# Patient Record
Sex: Female | Born: 1955 | Race: White | Hispanic: No | Marital: Married | Smoking: Former smoker
Health system: Southern US, Community
[De-identification: ages and names within clinical notes are randomized; demographics above are authoritative.]

## PROBLEM LIST (undated history)

## (undated) DIAGNOSIS — M722 Plantar fascial fibromatosis: Secondary | ICD-10-CM

## (undated) DIAGNOSIS — E7800 Pure hypercholesterolemia, unspecified: Secondary | ICD-10-CM

## (undated) DIAGNOSIS — N649 Disorder of breast, unspecified: Secondary | ICD-10-CM

## (undated) DIAGNOSIS — S83209A Unspecified tear of unspecified meniscus, current injury, unspecified knee, initial encounter: Secondary | ICD-10-CM

## (undated) DIAGNOSIS — N3020 Other chronic cystitis without hematuria: Secondary | ICD-10-CM

## (undated) DIAGNOSIS — F419 Anxiety disorder, unspecified: Secondary | ICD-10-CM

## (undated) DIAGNOSIS — F32A Depression, unspecified: Secondary | ICD-10-CM

## (undated) DIAGNOSIS — E039 Hypothyroidism, unspecified: Secondary | ICD-10-CM

## (undated) DIAGNOSIS — M1990 Unspecified osteoarthritis, unspecified site: Secondary | ICD-10-CM

## (undated) DIAGNOSIS — F329 Major depressive disorder, single episode, unspecified: Secondary | ICD-10-CM

## (undated) DIAGNOSIS — IMO0002 Reserved for concepts with insufficient information to code with codable children: Secondary | ICD-10-CM

## (undated) HISTORY — DX: Unspecified osteoarthritis, unspecified site: M19.90

## (undated) HISTORY — PX: OTHER SURGICAL HISTORY: SHX169

## (undated) HISTORY — PX: BLEPHAROPLASTY: SUR158

## (undated) HISTORY — DX: Depression, unspecified: F32.A

## (undated) HISTORY — DX: Other chronic cystitis without hematuria: N30.20

## (undated) HISTORY — PX: BREAST ENHANCEMENT SURGERY: SHX7

## (undated) HISTORY — DX: Reserved for concepts with insufficient information to code with codable children: IMO0002

## (undated) HISTORY — DX: Pure hypercholesterolemia, unspecified: E78.00

## (undated) HISTORY — DX: Disorder of breast, unspecified: N64.9

## (undated) HISTORY — DX: Plantar fascial fibromatosis: M72.2

## (undated) HISTORY — DX: Hypothyroidism, unspecified: E03.9

## (undated) HISTORY — DX: Major depressive disorder, single episode, unspecified: F32.9

## (undated) HISTORY — DX: Unspecified tear of unspecified meniscus, current injury, unspecified knee, initial encounter: S83.209A

## (undated) HISTORY — PX: AUGMENTATION MAMMAPLASTY: SUR837

## (undated) HISTORY — DX: Anxiety disorder, unspecified: F41.9

---

## 2006-01-18 ENCOUNTER — Encounter: Payer: Self-pay | Admitting: Family Medicine

## 2006-02-09 ENCOUNTER — Encounter: Payer: Self-pay | Admitting: Family Medicine

## 2006-02-09 LAB — CONVERTED CEMR LAB: TSH: 0.032 microintl units/mL

## 2006-02-10 ENCOUNTER — Encounter: Payer: Self-pay | Admitting: Family Medicine

## 2006-02-10 LAB — CONVERTED CEMR LAB: HDL: 64 mg/dL

## 2006-08-12 ENCOUNTER — Encounter: Payer: Self-pay | Admitting: Family Medicine

## 2006-09-03 ENCOUNTER — Encounter: Payer: Self-pay | Admitting: Family Medicine

## 2006-09-03 LAB — CONVERTED CEMR LAB
Anion Gap: 5
BUN: 14 mg/dL
CO2: 33 meq/L
Calcium: 10.5 mg/dL
Creatinine, Ser: 0.8 mg/dL
Sodium: 138 meq/L

## 2007-03-31 ENCOUNTER — Encounter: Payer: Self-pay | Admitting: Family Medicine

## 2007-06-14 ENCOUNTER — Ambulatory Visit: Payer: Self-pay | Admitting: Family Medicine

## 2007-07-18 ENCOUNTER — Ambulatory Visit: Payer: Self-pay | Admitting: Family Medicine

## 2007-07-18 ENCOUNTER — Encounter: Admission: RE | Admit: 2007-07-18 | Discharge: 2007-07-18 | Payer: Self-pay | Admitting: Family Medicine

## 2007-07-18 DIAGNOSIS — M461 Sacroiliitis, not elsewhere classified: Secondary | ICD-10-CM | POA: Insufficient documentation

## 2007-07-18 DIAGNOSIS — N390 Urinary tract infection, site not specified: Secondary | ICD-10-CM

## 2007-07-21 ENCOUNTER — Encounter: Payer: Self-pay | Admitting: Family Medicine

## 2007-10-05 ENCOUNTER — Ambulatory Visit: Payer: Self-pay | Admitting: Family Medicine

## 2007-10-05 DIAGNOSIS — E039 Hypothyroidism, unspecified: Secondary | ICD-10-CM | POA: Insufficient documentation

## 2007-10-05 LAB — CONVERTED CEMR LAB
Bilirubin Urine: NEGATIVE
Blood in Urine, dipstick: NEGATIVE
Glucose, Urine, Semiquant: NEGATIVE
Ketones, urine, test strip: NEGATIVE
Protein, U semiquant: NEGATIVE

## 2007-10-06 ENCOUNTER — Encounter: Payer: Self-pay | Admitting: Family Medicine

## 2007-10-07 ENCOUNTER — Telehealth: Payer: Self-pay | Admitting: Family Medicine

## 2007-10-12 ENCOUNTER — Telehealth (INDEPENDENT_AMBULATORY_CARE_PROVIDER_SITE_OTHER): Payer: Self-pay | Admitting: *Deleted

## 2007-10-21 ENCOUNTER — Encounter: Payer: Self-pay | Admitting: Family Medicine

## 2007-10-21 LAB — CONVERTED CEMR LAB
ALT: 10 units/L (ref 0–35)
AST: 18 units/L (ref 0–37)
CO2: 26 meq/L (ref 19–32)
Calcium: 10.2 mg/dL (ref 8.4–10.5)
Chloride: 103 meq/L (ref 96–112)
Cholesterol: 271 mg/dL — ABNORMAL HIGH (ref 0–200)
Creatinine, Ser: 0.92 mg/dL (ref 0.40–1.20)
Potassium: 4.5 meq/L (ref 3.5–5.3)
Sodium: 142 meq/L (ref 135–145)
TSH: 0.067 microintl units/mL — ABNORMAL LOW (ref 0.350–5.50)
Total CHOL/HDL Ratio: 4.4
Total Protein: 7.5 g/dL (ref 6.0–8.3)

## 2007-10-24 ENCOUNTER — Encounter: Payer: Self-pay | Admitting: Family Medicine

## 2007-11-11 ENCOUNTER — Ambulatory Visit: Payer: Self-pay | Admitting: Family Medicine

## 2007-11-11 DIAGNOSIS — K120 Recurrent oral aphthae: Secondary | ICD-10-CM | POA: Insufficient documentation

## 2007-12-12 ENCOUNTER — Telehealth (INDEPENDENT_AMBULATORY_CARE_PROVIDER_SITE_OTHER): Payer: Self-pay | Admitting: *Deleted

## 2007-12-13 ENCOUNTER — Ambulatory Visit: Payer: Self-pay | Admitting: Family Medicine

## 2008-01-04 ENCOUNTER — Encounter: Admission: RE | Admit: 2008-01-04 | Discharge: 2008-01-04 | Payer: Self-pay | Admitting: Family Medicine

## 2008-01-06 ENCOUNTER — Ambulatory Visit: Payer: Self-pay | Admitting: Obstetrics & Gynecology

## 2008-01-06 ENCOUNTER — Encounter: Payer: Self-pay | Admitting: Physician Assistant

## 2008-01-17 ENCOUNTER — Telehealth (INDEPENDENT_AMBULATORY_CARE_PROVIDER_SITE_OTHER): Payer: Self-pay | Admitting: *Deleted

## 2008-01-18 ENCOUNTER — Encounter: Admission: RE | Admit: 2008-01-18 | Discharge: 2008-01-18 | Payer: Self-pay | Admitting: Family Medicine

## 2008-02-21 ENCOUNTER — Ambulatory Visit: Payer: Self-pay | Admitting: Family Medicine

## 2008-02-21 DIAGNOSIS — M1610 Unilateral primary osteoarthritis, unspecified hip: Secondary | ICD-10-CM | POA: Insufficient documentation

## 2008-04-10 ENCOUNTER — Ambulatory Visit: Payer: Self-pay | Admitting: Obstetrics and Gynecology

## 2008-05-17 ENCOUNTER — Telehealth (INDEPENDENT_AMBULATORY_CARE_PROVIDER_SITE_OTHER): Payer: Self-pay | Admitting: *Deleted

## 2008-05-28 ENCOUNTER — Ambulatory Visit: Payer: Self-pay | Admitting: Family Medicine

## 2008-05-28 DIAGNOSIS — E785 Hyperlipidemia, unspecified: Secondary | ICD-10-CM

## 2008-06-14 ENCOUNTER — Encounter: Payer: Self-pay | Admitting: Family Medicine

## 2008-06-18 ENCOUNTER — Encounter: Payer: Self-pay | Admitting: Family Medicine

## 2008-06-18 LAB — CONVERTED CEMR LAB
Cholesterol: 312 mg/dL — ABNORMAL HIGH (ref 0–200)
Total CHOL/HDL Ratio: 5.4
Triglycerides: 191 mg/dL — ABNORMAL HIGH (ref <150)
VLDL: 38 mg/dL (ref 0–40)

## 2008-07-13 ENCOUNTER — Telehealth: Payer: Self-pay | Admitting: Family Medicine

## 2008-07-18 ENCOUNTER — Encounter: Admission: RE | Admit: 2008-07-18 | Discharge: 2008-07-18 | Payer: Self-pay | Admitting: Family Medicine

## 2008-09-11 ENCOUNTER — Telehealth: Payer: Self-pay | Admitting: Family Medicine

## 2008-09-11 DIAGNOSIS — N951 Menopausal and female climacteric states: Secondary | ICD-10-CM | POA: Insufficient documentation

## 2008-09-21 ENCOUNTER — Encounter: Admission: RE | Admit: 2008-09-21 | Discharge: 2008-09-21 | Payer: Self-pay | Admitting: Family Medicine

## 2008-09-25 ENCOUNTER — Encounter: Payer: Self-pay | Admitting: Family Medicine

## 2008-09-25 DIAGNOSIS — M8580 Other specified disorders of bone density and structure, unspecified site: Secondary | ICD-10-CM

## 2008-09-26 ENCOUNTER — Ambulatory Visit: Payer: Self-pay | Admitting: Family Medicine

## 2008-10-16 ENCOUNTER — Encounter: Payer: Self-pay | Admitting: Family Medicine

## 2008-11-06 ENCOUNTER — Ambulatory Visit: Payer: Self-pay | Admitting: Family Medicine

## 2008-11-06 DIAGNOSIS — M4120 Other idiopathic scoliosis, site unspecified: Secondary | ICD-10-CM | POA: Insufficient documentation

## 2008-11-08 ENCOUNTER — Telehealth (INDEPENDENT_AMBULATORY_CARE_PROVIDER_SITE_OTHER): Payer: Self-pay | Admitting: *Deleted

## 2008-11-23 ENCOUNTER — Encounter: Payer: Self-pay | Admitting: Family Medicine

## 2008-11-27 ENCOUNTER — Telehealth (INDEPENDENT_AMBULATORY_CARE_PROVIDER_SITE_OTHER): Payer: Self-pay | Admitting: *Deleted

## 2008-11-30 ENCOUNTER — Encounter: Payer: Self-pay | Admitting: Family Medicine

## 2008-12-03 ENCOUNTER — Ambulatory Visit: Payer: Self-pay | Admitting: Family Medicine

## 2008-12-03 DIAGNOSIS — F332 Major depressive disorder, recurrent severe without psychotic features: Secondary | ICD-10-CM | POA: Insufficient documentation

## 2008-12-04 LAB — CONVERTED CEMR LAB: Vit D, 1,25-Dihydroxy: 41 (ref 30–89)

## 2008-12-05 ENCOUNTER — Ambulatory Visit: Payer: Self-pay | Admitting: Cardiology

## 2008-12-11 ENCOUNTER — Encounter: Payer: Self-pay | Admitting: Family Medicine

## 2008-12-28 ENCOUNTER — Ambulatory Visit: Payer: Self-pay

## 2008-12-28 ENCOUNTER — Encounter: Payer: Self-pay | Admitting: Cardiology

## 2009-01-04 ENCOUNTER — Telehealth: Payer: Self-pay | Admitting: Family Medicine

## 2009-01-07 ENCOUNTER — Encounter: Payer: Self-pay | Admitting: Family

## 2009-01-07 ENCOUNTER — Ambulatory Visit: Payer: Self-pay | Admitting: Family

## 2009-01-14 ENCOUNTER — Ambulatory Visit: Payer: Self-pay | Admitting: Family Medicine

## 2009-01-15 ENCOUNTER — Encounter: Payer: Self-pay | Admitting: Family Medicine

## 2009-01-15 LAB — CONVERTED CEMR LAB: TSH: 1.326 microintl units/mL (ref 0.350–4.50)

## 2009-01-18 ENCOUNTER — Encounter: Payer: Self-pay | Admitting: Family Medicine

## 2009-01-22 ENCOUNTER — Telehealth: Payer: Self-pay | Admitting: Family Medicine

## 2009-01-25 ENCOUNTER — Telehealth: Payer: Self-pay | Admitting: Family Medicine

## 2009-01-29 ENCOUNTER — Encounter: Admission: RE | Admit: 2009-01-29 | Discharge: 2009-01-29 | Payer: Self-pay | Admitting: Family Medicine

## 2009-02-15 ENCOUNTER — Ambulatory Visit: Payer: Self-pay | Admitting: Family Medicine

## 2009-02-20 ENCOUNTER — Telehealth: Payer: Self-pay | Admitting: Family Medicine

## 2009-03-11 ENCOUNTER — Telehealth: Payer: Self-pay | Admitting: Family Medicine

## 2009-04-05 ENCOUNTER — Encounter: Payer: Self-pay | Admitting: Family Medicine

## 2009-04-10 ENCOUNTER — Telehealth: Payer: Self-pay | Admitting: Family Medicine

## 2009-06-26 ENCOUNTER — Ambulatory Visit: Payer: Self-pay | Admitting: Family Medicine

## 2009-06-26 DIAGNOSIS — R6882 Decreased libido: Secondary | ICD-10-CM

## 2009-06-27 ENCOUNTER — Telehealth (INDEPENDENT_AMBULATORY_CARE_PROVIDER_SITE_OTHER): Payer: Self-pay | Admitting: *Deleted

## 2009-06-27 LAB — CONVERTED CEMR LAB
ALT: 15 units/L (ref 0–35)
AST: 25 units/L (ref 0–37)
Albumin: 4.5 g/dL (ref 3.5–5.2)
BUN: 17 mg/dL (ref 6–23)
CO2: 25 meq/L (ref 19–32)
Calcium: 9.8 mg/dL (ref 8.4–10.5)
Chloride: 103 meq/L (ref 96–112)
Potassium: 4.5 meq/L (ref 3.5–5.3)
TSH: 1.215 microintl units/mL (ref 0.350–4.500)

## 2009-07-11 ENCOUNTER — Telehealth: Payer: Self-pay | Admitting: Family Medicine

## 2009-08-16 ENCOUNTER — Ambulatory Visit: Payer: Self-pay | Admitting: Family Medicine

## 2009-08-16 DIAGNOSIS — R21 Rash and other nonspecific skin eruption: Secondary | ICD-10-CM | POA: Insufficient documentation

## 2009-08-17 ENCOUNTER — Encounter: Payer: Self-pay | Admitting: Family Medicine

## 2009-10-21 ENCOUNTER — Telehealth (INDEPENDENT_AMBULATORY_CARE_PROVIDER_SITE_OTHER): Payer: Self-pay | Admitting: *Deleted

## 2009-12-19 ENCOUNTER — Ambulatory Visit: Payer: Self-pay | Admitting: Family Medicine

## 2009-12-27 ENCOUNTER — Encounter: Payer: Self-pay | Admitting: Family Medicine

## 2009-12-30 LAB — CONVERTED CEMR LAB
ALT: 12 units/L (ref 0–35)
AST: 18 units/L (ref 0–37)
Alkaline Phosphatase: 46 units/L (ref 39–117)
BUN: 17 mg/dL (ref 6–23)
Creatinine, Ser: 0.71 mg/dL (ref 0.40–1.20)
HDL: 60 mg/dL (ref >39)
LDL Cholesterol: 123 mg/dL — ABNORMAL HIGH (ref 0–99)
TSH: 0.723 microintl units/mL (ref 0.350–4.500)
Total CHOL/HDL Ratio: 3.4
VLDL: 20 mg/dL (ref 0–40)

## 2010-01-14 ENCOUNTER — Ambulatory Visit: Payer: Self-pay | Admitting: Obstetrics & Gynecology

## 2010-02-04 ENCOUNTER — Encounter: Admission: RE | Admit: 2010-02-04 | Discharge: 2010-02-04 | Payer: Self-pay | Admitting: Family Medicine

## 2010-02-17 ENCOUNTER — Encounter: Admission: RE | Admit: 2010-02-17 | Discharge: 2010-02-17 | Payer: Self-pay | Admitting: Family Medicine

## 2010-02-17 ENCOUNTER — Ambulatory Visit: Payer: Self-pay | Admitting: Family Medicine

## 2010-02-17 DIAGNOSIS — R109 Unspecified abdominal pain: Secondary | ICD-10-CM

## 2010-02-19 ENCOUNTER — Telehealth (INDEPENDENT_AMBULATORY_CARE_PROVIDER_SITE_OTHER): Payer: Self-pay | Admitting: *Deleted

## 2010-02-24 ENCOUNTER — Telehealth (INDEPENDENT_AMBULATORY_CARE_PROVIDER_SITE_OTHER): Payer: Self-pay | Admitting: *Deleted

## 2010-02-24 ENCOUNTER — Telehealth: Payer: Self-pay | Admitting: Family Medicine

## 2010-02-25 ENCOUNTER — Encounter: Payer: Self-pay | Admitting: Family Medicine

## 2010-03-24 ENCOUNTER — Encounter: Payer: Self-pay | Admitting: Family Medicine

## 2010-04-09 ENCOUNTER — Encounter: Payer: Self-pay | Admitting: Family Medicine

## 2010-04-14 ENCOUNTER — Encounter: Payer: Self-pay | Admitting: Family Medicine

## 2010-04-15 ENCOUNTER — Ambulatory Visit: Payer: Self-pay | Admitting: Family Medicine

## 2010-04-15 DIAGNOSIS — S329XXA Fracture of unspecified parts of lumbosacral spine and pelvis, initial encounter for closed fracture: Secondary | ICD-10-CM | POA: Insufficient documentation

## 2010-04-15 DIAGNOSIS — H1130 Conjunctival hemorrhage, unspecified eye: Secondary | ICD-10-CM | POA: Insufficient documentation

## 2010-04-15 LAB — CONVERTED CEMR LAB
Glucose, Urine, Semiquant: NEGATIVE
Nitrite: POSITIVE
Specific Gravity, Urine: 1.025

## 2010-04-17 ENCOUNTER — Encounter: Payer: Self-pay | Admitting: Family Medicine

## 2010-04-30 ENCOUNTER — Ambulatory Visit: Payer: Self-pay | Admitting: Family Medicine

## 2010-04-30 LAB — CONVERTED CEMR LAB
Ketones, urine, test strip: NEGATIVE
Nitrite: NEGATIVE
Urobilinogen, UA: 0.2

## 2010-05-01 ENCOUNTER — Encounter: Payer: Self-pay | Admitting: Family Medicine

## 2010-05-05 ENCOUNTER — Encounter: Payer: Self-pay | Admitting: Family Medicine

## 2010-05-13 ENCOUNTER — Ambulatory Visit: Payer: Self-pay | Admitting: Obstetrics & Gynecology

## 2010-06-25 ENCOUNTER — Ambulatory Visit: Payer: Self-pay | Admitting: Obstetrics & Gynecology

## 2010-06-30 ENCOUNTER — Telehealth: Payer: Self-pay | Admitting: Cardiology

## 2010-07-04 ENCOUNTER — Telehealth: Payer: Self-pay | Admitting: Family Medicine

## 2010-07-16 ENCOUNTER — Ambulatory Visit: Payer: Self-pay | Admitting: Family Medicine

## 2010-07-16 DIAGNOSIS — M545 Low back pain: Secondary | ICD-10-CM

## 2010-07-29 ENCOUNTER — Ambulatory Visit: Payer: Self-pay | Admitting: Family Medicine

## 2010-07-29 DIAGNOSIS — J209 Acute bronchitis, unspecified: Secondary | ICD-10-CM

## 2010-08-04 ENCOUNTER — Telehealth: Payer: Self-pay | Admitting: Family Medicine

## 2010-08-08 ENCOUNTER — Telehealth (INDEPENDENT_AMBULATORY_CARE_PROVIDER_SITE_OTHER): Payer: Self-pay | Admitting: *Deleted

## 2010-08-12 ENCOUNTER — Ambulatory Visit: Payer: Self-pay | Admitting: Family Medicine

## 2010-08-12 DIAGNOSIS — F410 Panic disorder [episodic paroxysmal anxiety] without agoraphobia: Secondary | ICD-10-CM

## 2010-08-19 ENCOUNTER — Telehealth (INDEPENDENT_AMBULATORY_CARE_PROVIDER_SITE_OTHER): Payer: Self-pay | Admitting: *Deleted

## 2010-08-21 ENCOUNTER — Telehealth: Payer: Self-pay | Admitting: Family Medicine

## 2010-09-09 ENCOUNTER — Telehealth (INDEPENDENT_AMBULATORY_CARE_PROVIDER_SITE_OTHER): Payer: Self-pay | Admitting: *Deleted

## 2010-09-09 ENCOUNTER — Encounter: Payer: Self-pay | Admitting: Family Medicine

## 2010-09-09 ENCOUNTER — Telehealth: Payer: Self-pay | Admitting: Family Medicine

## 2010-09-16 ENCOUNTER — Ambulatory Visit: Payer: Self-pay | Admitting: Obstetrics & Gynecology

## 2010-09-16 ENCOUNTER — Ambulatory Visit: Payer: Self-pay | Admitting: Family Medicine

## 2010-09-29 ENCOUNTER — Telehealth: Payer: Self-pay | Admitting: Family Medicine

## 2010-09-30 ENCOUNTER — Telehealth: Payer: Self-pay | Admitting: Family Medicine

## 2010-10-09 ENCOUNTER — Encounter: Payer: Self-pay | Admitting: Family Medicine

## 2010-11-05 ENCOUNTER — Ambulatory Visit: Payer: Self-pay | Admitting: Obstetrics & Gynecology

## 2010-11-06 ENCOUNTER — Encounter: Payer: Self-pay | Admitting: Obstetrics & Gynecology

## 2010-11-06 LAB — CONVERTED CEMR LAB: Yeast Wet Prep HPF POC: NONE SEEN

## 2010-11-11 ENCOUNTER — Encounter: Admission: RE | Admit: 2010-11-11 | Discharge: 2010-11-11 | Payer: Self-pay | Admitting: Obstetrics & Gynecology

## 2010-11-19 ENCOUNTER — Ambulatory Visit: Payer: Self-pay | Admitting: Obstetrics & Gynecology

## 2010-11-21 ENCOUNTER — Ambulatory Visit (HOSPITAL_COMMUNITY): Payer: Self-pay | Admitting: Psychiatry

## 2011-01-05 ENCOUNTER — Ambulatory Visit (HOSPITAL_COMMUNITY)
Admission: RE | Admit: 2011-01-05 | Discharge: 2011-01-05 | Payer: Self-pay | Source: Home / Self Care | Attending: Psychiatry | Admitting: Psychiatry

## 2011-01-06 ENCOUNTER — Ambulatory Visit
Admission: RE | Admit: 2011-01-06 | Discharge: 2011-01-06 | Payer: Self-pay | Source: Home / Self Care | Attending: Family Medicine | Admitting: Family Medicine

## 2011-01-21 NOTE — Consult Note (Signed)
Summary: [facility]  [facility]   Imported By: Lanelle Bal 04/17/2010 11:06:15  _____________________________________________________________________  External Attachment:    Type:   Image     Comment:   External Document

## 2011-01-21 NOTE — Letter (Signed)
Summary: Out of Work  [facility]  [address], [address]   [place], [place] [address]   Phone: [phone]  Fax: [phone]    July 29, 2010   Employee:  [name]    To Whom It May Concern:   For Medical reasons, please excuse the above named employee from work for the following dates:  Start:   Aug 8th - 9th  End:   Augh 10th  If you need additional information, please feel free to contact our office.         Sincerely,    Seymour Bars DO

## 2011-01-21 NOTE — Assessment & Plan Note (Signed)
Summary: URI   Vital Signs:  Patient profile:   55 year old female Height:      66.5 inches Weight:      167 pounds BMI:     26.65 O2 Sat:      99 % on Room air Temp:     99.2 degrees F oral Pulse rate:   89 / minute BP sitting:   100 / 76  (left arm) Cuff size:   regular  Vitals Entered By: Payton Spark CMA (July 29, 2010 10:09 AM)  O2 Flow:  Room air  Serial Vital Signs/Assessments:  Comments: 10:10 AM Peak Flow 300 Yellow Zone By: Payton Spark CMA   CC: Cough, congestion and SOB x 5 days.   Primary Care Provider:  Seymour Bars D.O.  CC:  Cough and congestion and SOB x 5 days.Marland Kitchen  History of Present Illness: 55 yo WF presents for cold symptoms that started 5 days ago.  She has had some subjective fevers and chills.  Feeling tired.  Coughing at night.  Started with a sore throat and head congestion.  She is using Dayquil and Nyquil.  Works at a nursing home.  Feels SOB.  Not sleeping well due to cough.  Denies known sick exposure.  Denies any GI upset.  She has almost had posttussive emesis.  Her cough is mostly dry.  She has some tightness.  She is not a smoker and has no hx of asthma.  Allergies (verified): 1)  ! Wellbutrin  Past History:  Past Medical History: Reviewed history from 12/19/2009 and no changes required.   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 Married.  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 no exercise recently.  Fair diet.     Review of Systems      See HPI  Physical Exam  General:  alert, well-developed, well-nourished, and well-hydrated.   Head:  normocephalic and atraumatic.  sinuses NTTP Eyes:  conjunctiva clear Ears:  EACs patent; TMs  translucent and gray with good cone of light and bony landmarks.  Nose:  clear rhinorrhea with boggy turbinates Mouth:  o/p mildly injected with clear postnasal drip.  No exudates or vesicles Neck:  no masses.   Lungs:  Normal respiratory effort, chest expands symmetrically. Lungs are clear to auscultation, no crackles or wheezes.  dry cough Heart:  Normal rate and regular rhythm. S1 and S2 normal without gallop, murmur, click, rub or other extra sounds. Skin:  color normal and no rashes.   Cervical Nodes:  shotty anterior cervical chain LNs   Impression & Recommendations:  Problem # 1:  BRONCHITIS, VIRAL (ICD-466.0)  Day 5 of viral URI/ bronchitis wtih PFs in the yellow zone but clear lung exam and normal pulse ox. Will treat with supportive care/ out of work note for today + RX anti-tussives at night and Prednisone 40 mg/ day x 5 days. Call if getting worse or if not improved by Monday.   55 updated medication list for this problem includes:    Cheratussin Ac 100-10 Mg/26ml Syrp (Guaifenesin-codeine) .Marland KitchenMarland KitchenMarland KitchenMarland Kitchen 5 ml by mouth q hs as needed cough  Orders: Peak Flow Rate ([address])  Complete Medication List: 1)  Levothyroxine Sodium 75 Mcg Tabs (Levothyroxine sodium) .Marland Kitchen.. 1 tab by mouth daily 2)  One-daily Multivitamins Tabs (Multiple vitamin) .... Take 1  tablet by mouth once a day 3)  Oscal 500/200 D-3 500-200 Mg-unit Tabs (Calcium-vitamin d) .... Take 1 tablet by mouth two times a day 4)  Fluoxetine Hcl 40 Mg Caps (Fluoxetine hcl) .Marland Kitchen.. 1 capsule by mouth daily 5)  Simvastatin 20 Mg Tabs (Simvastatin) .Marland Kitchen.. 1 tab by mouth qhs 6)  Macrodantin 50 Mg Caps (Nitrofurantoin macrocrystal) .Marland Kitchen.. 1 tab by mouth qpm with dinner 7)  Tylenol Extra Strength 500 Mg Tabs (Acetaminophen) .... 2 tab by mouth three times a day as needed pain 8)  Etodolac 400 Mg Tabs (Etodolac) .Marland Kitchen.. 1 tab by mouth two times a day with food as needed for back pain 9)  Prednisone 20 Mg Tabs (Prednisone) .... 2 tabs by mouth qam x  5 days 10)  Cheratussin Ac 100-10 Mg/18ml Syrp (Guaifenesin-codeine) .... 5 ml by mouth q hs as needed cough  Patient Instructions: 1)  Take Cheratussin AC at bedtime for cough. 2)  Use OTC Mucinex DM in the morning for cough and congestion. 3)  Use Prednisone 40 mg every AM for bronchitis/ shortness of breathe.   Prescriptions: CHERATUSSIN AC 100-10 MG/5ML SYRP (GUAIFENESIN-CODEINE) 5 ml by mouth q hs as needed cough  #100 ml x 0   Entered and Authorized by:   Seymour Bars DO   Signed by:   Seymour Bars DO on 07/29/2010   Method used:   Printed then faxed to ...       [facility]* (retail)       [address]       [place], [place]  [address]       Ph: [phone]       Fax: [phone]   RxID:   [id number] PREDNISONE 20 MG TABS (PREDNISONE) 2 tabs by mouth qAM x 5 days  #10 x 0   Entered and Authorized by:   Seymour Bars DO   Signed by:   Seymour Bars DO on 07/29/2010   Method used:   Electronically to        [facility]* (retail)       [address]       [place], [place]  [address]       Ph: [phone]       Fax: [phone]   RxID:   [id number]

## 2011-01-21 NOTE — Progress Notes (Signed)
Summary: Papers  Phone Note Call from Patient Call back at Home Phone [phone]   Caller: Patient Call For: Seymour Bars DO Summary of Call: Pt called and states that her work did not get paperwork for her to return back towork. Please fax this to [facility] at [phone] Initial call taken by: Kathlene November,  September 09, 2010 2:57 PM  Follow-up for Phone Call        This was just faxed after lunch Follow-up by: Payton Spark CMA,  September 09, 2010 3:09 PM

## 2011-01-21 NOTE — Progress Notes (Signed)
Summary: pt has questions about meds   Phone Note Call from Patient Call back at Home Phone [phone]   Caller: Patient Reason for Call: Talk to Nurse, Talk to Doctor Summary of Call: pt has medication questions regarding crestor. I explain I couldn't talk to her about meds becasue she has not been seen since 2009 and that was just her first visit Initial call taken by: Omer Jack,  June 30, 2010 2:26 PM  Follow-up for Phone Call        Left message to call back ,Deliah Goody, RN  June 30, 2010 4:50 PM  spoke with pt, she was wanting to change crestor to a cheaper statin. her labs are followed by dr Cathey Endow, pt instructed to contact dr Cathey Endow for med change Deliah Goody, RN  July 01, 2010 9:43 AM      Appended Document: pt has questions about meds Pt would like to know if you will change Crestor to cheaper Rx. Please advise.  Appended Document: pt has questions about meds changed Crestor to Simvastin 20 mg at bedtime.  Seymour Bars, D.O.  Appended Document: pt has questions about meds 07/02/2010 @ 1:58pm-Pt notifeid of MD instructions and that med sent to pharmacy. kJ LPN

## 2011-01-21 NOTE — Letter (Signed)
Summary: [facility]  [facility]   Imported By: Lanelle Bal 05/01/2010 12:55:15  _____________________________________________________________________  External Attachment:    Type:   Image     Comment:   External Document

## 2011-01-21 NOTE — Letter (Signed)
Summary: Generic Letter  [facility]  [address]   [place], [place] [address]   Phone: [phone]  Fax: [phone]    09/09/2010  [name] [address] [place], [place]  [address]  To Whom It May Concern,  Ms [name] was seen and evaluated on Aug 23rd, 2011 for an unstable medical problem.  She is complying with the proper course of treatment.  She has been out of work since this time and is appropriate to return to work tomorrow, Wednesday, Sept 21st, 2011.  Please contact me if you have any questions.      Sincerely,    Seymour Bars DO

## 2011-01-21 NOTE — Progress Notes (Signed)
Summary: f/u from pubic ramus fx  Phone Note Outgoing Call   Summary of Call: Pls let pt know that I spoke to Dr Althea Charon about her Xray.  He agreed that she can do some exercise like yoga and recumbent bike but no running or vigorous walking/ hiking until fracture heels (  ~ 8 wks ).  PT may help with pain but it will not help her fracture.  I would recommend that she f/u with Dr Althea Charon in 1 month.   Initial call taken by: Seymour Bars DO,  February 19, 2010 1:48 PM  Follow-up for Phone Call        Pt aware of he above Follow-up by: Payton Spark CMA,  February 19, 2010 2:09 PM

## 2011-01-21 NOTE — Letter (Signed)
Summary: [facility]  [facility]   Imported By: Lanelle Bal 10/24/2010 11:09:22  _____________________________________________________________________  External Attachment:    Type:   Image     Comment:   External Document

## 2011-01-21 NOTE — Progress Notes (Signed)
Summary: Work note  Phone Note Call from Patient   Caller: Patient Summary of Call: Pt LMOM stating she would like to return to work tomorrow and needs letter stating she has been out for the last 3 weeks due to med condition and also stating she is OK to return tomorrow. Please fax to [phone] Initial call taken by: Payton Spark CMA,  September 09, 2010 12:01 PM  Follow-up for Phone Call        Since her appt is not till [facility] with Dr Christell Constant, schedule f/u with me for panic disorder in the next wk.  I will prepare her note. Follow-up by: Seymour Bars DO,  September 09, 2010 12:35 PM     Appended Document: Work note note faxed

## 2011-01-21 NOTE — Assessment & Plan Note (Signed)
Summary: eye bleeding   Vital Signs:  Patient profile:   55 year old female Height:      66.5 inches Weight:      172 pounds BMI:     27.44 O2 Sat:      95 % on Room air Temp:     98.3 degrees F oral Pulse rate:   75 / minute BP sitting:   111 / 69  (left arm) Cuff size:   regular  Vitals Entered By: Payton Spark CMA (April 15, 2010 3:35 PM)  O2 Flow:  Room air CC: Irritated R eye. Also c/o ? UTI.   Vision Screening:Left eye w/o correction: 20 / 30 Right Eye w/o correction: 20 / 400 Both eyes w/o correction:  20/ 25        Vision Entered By: Payton Spark CMA (April 15, 2010 3:36 PM)   Primary Care Provider:  Seymour Bars D.O.  CC:  Irritated R eye. Also c/o ? UTI. Marland Kitchen  History of Present Illness: 54 yo WF presents for an itchy tearful eye on the R side only x 1 day.  No known exposure to pink eye.  No trauma or pain.  She has mild edema.  She does not wear contacts.  She had a spot of blood come out this morning.  She sees [facility] routinely.  She also started to have dysuria about 4 days ago.  She has cloudy urine.  Denies fevers, GI upset, abd pain or flank pain.  She had been taking Macrobid for prophlaxis but stopped it a few mos ago.    She has been seeing Dr Althea Charon -- recently scanned and diagnosed with a non traumatic pubic ramus fracture, trochanteric bursitis and an inflammed R hamstring muscle.  She had to take a break from vigorous exercise.  Does not complain of pain.    Allergies: 1)  ! Wellbutrin  Past History:  Past Medical History: Reviewed history from 12/19/2009 and no changes required.   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 Married.  No children. 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 Quit  smoking in 1984, rare ETOH. no exercise recently.  Fair diet.     Review of Systems      See HPI  Physical Exam  General:  alert, well-developed, well-nourished, and well-hydrated.   Head:  normocephalic and atraumatic.   Eyes:  EOMI, no lid edema.  conjunctiva clear, slight R>L eye watering with matted blood cover bottom lashes and inner canthus.  no discharge.  Small blood blister inside the lower lid.   Nose:  no nasal discharge.   Mouth:  good dentition and pharynx pink and moist.   Lungs:  Normal respiratory effort, chest expands symmetrically. Lungs are clear to auscultation, no crackles or wheezes. Heart:  Normal rate and regular rhythm. S1 and S2 normal without gallop, murmur, click, rub or other extra sounds. Abdomen:  soft.  no CVAT or suprapubic tenderness Skin:  color normal.   Psych:  good eye contact, not anxious appearing, and not depressed appearing.     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   Will get her in with Dr August Luz this wk for f/u.  Empirically treat for infection with  tobrex drops given signs of bleeding.  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   Her updated medication list for this problem includes:    Macrodantin 50 Mg Caps (Nitrofurantoin macrocrystal) .Marland Kitchen... Take 1 cap by mouth as directed as needed for uti  Orders: T-Culture, Urine ([id number]) UA Dipstick w/o Micro (automated)  ([address]1003)  Problem # 3:  PELVIC FRACTURE (ICD-808.8) Pubic Ramus Fx on recent scanning with Dr Althea Charon-- not having any pain.  Supportive care for treatment.   Has f/u with Dr Althea Charon.  DEXA is due this Fall.  Complete Medication List: 1)  Levothyroxine Sodium 75 Mcg Tabs (Levothyroxine  sodium) .Marland Kitchen.. 1 tab by mouth daily 2)  Flexeril 10 Mg Tabs (Cyclobenzaprine hcl) .... Take 1 tablet by mouth once a day in pm as needed 3)  One-daily Multivitamins Tabs (Multiple vitamin) .... Take 1 tablet by mouth once a day 4)  Oscal 500/200 D-3 500-200 Mg-unit Tabs (Calcium-vitamin d) .... Take 1 tablet by mouth two times a day 5)  Alprazolam 0.5 Mg Tabs (Alprazolam) .... 1/2 to 1 tab by mouth two times a day as needed anxiety 6)  Fluoxetine Hcl 40 Mg Caps (Fluoxetine hcl) .Marland Kitchen.. 1 capsule by mouth daily 7)  Crestor 10 Mg Tabs (Rosuvastatin calcium) .... Take 1 tablet by mouth once a day 8)  Mobic 7.5 Mg Tabs (Meloxicam) .Marland Kitchen.. 1-2 tabs by mouth daily with food for hip pain 9)  Oxycodone-acetaminophen 5-325 Mg Tabs (Oxycodone-acetaminophen) .... Take 1 tab every 4-6 hours as needed 10)  Macrodantin 50 Mg Caps (Nitrofurantoin macrocrystal) .... Take 1 cap by mouth as directed as needed for uti 11)  Tobrex 0.3 % Soln (Tobramycin sulfate) .... 2 drops in the right eye q 4 hrs x 1 wk  Patient Instructions: 1)  Will call you with urine culture results in 48 hrs and will start antibiotics at that time. 2)  Will get you in with [facility] this wk. 3)  Start Tobrex drops in the R eye. Prescriptions: TOBREX 0.3 % SOLN (TOBRAMYCIN SULFATE) 2 drops in the right eye q 4 hrs x 1 wk  #1 bottle x 0   Entered and Authorized by:   Seymour Bars DO   Signed by:   Seymour Bars DO on 04/15/2010   Method used:   Electronically to        [facility]* (retail)       [address]       [place], [place]  [address]       Ph: [phone]       Fax: [phone]   RxID:   [id number] TOBREX 0.3 % SOLN (TOBRAMYCIN SULFATE) 2 drops in the right eye x 1 wk  #1 bottle x 0   Entered and Authorized by:   Seymour Bars DO   Signed by:   Seymour Bars DO on 04/15/2010   Method used:   Electronically to        [facility]* (retail)       [address]       [place], [place]  [address]       Ph: [phone]       Fax:  [phone]   RxID:   [id number] MOBIC 7.5 MG TABS (MELOXICAM) 1-2 tabs by mouth daily with food for hip pain  #40 x 1   Entered and Authorized by:   Clydie Braun  Chonte Ricke DO   Signed by:   Seymour Bars DO on 04/15/2010   Method used:   Electronically to        [facility]* (retail)       [address]       [place], [place]  [address]       Ph: [phone]       Fax: [phone]   RxID:   [id number] FLUOXETINE HCL 40 MG CAPS (FLUOXETINE HCL) 1 capsule by mouth daily  #30 x 5   Entered and Authorized by:   Seymour Bars DO   Signed by:   Seymour Bars DO on 04/15/2010   Method used:   Electronically to        [facility]* (retail)       [address]       [place], [place]  [address]       Ph: [phone]       Fax: [phone]   RxID:   [id number] LEVOTHYROXINE SODIUM 75 MCG  TABS (LEVOTHYROXINE SODIUM) 1 tab by mouth daily  #30 x 2   Entered and Authorized by:   Seymour Bars DO   Signed by:   Seymour Bars DO on 04/15/2010   Method used:   Electronically to        [facility]* (retail)       [address]       [place], [place]  [address]       Ph: [phone]       Fax: [phone]   RxID:   [id number]   Laboratory Results   Urine Tests    Routine Urinalysis   Color: yellow Appearance: Clear Glucose: negative   (Normal Range: Negative) Bilirubin: negative   (Normal Range: Negative) Ketone: negative   (Normal Range: Negative) Spec. Gravity: 1.025   (Normal Range: 1.003-1.035) Blood: small   (Normal Range: Negative) pH: 5.0   (Normal Range: 5.0-8.0) Protein: negative   (Normal Range: Negative) Urobilinogen: 0.2   (Normal Range: 0-1) Nitrite: positive   (Normal Range: Negative) Leukocyte Esterace: small   (Normal Range: Negative)

## 2011-01-21 NOTE — Progress Notes (Signed)
Summary: Pain meds  Phone Note Call from Patient   Caller: Patient Summary of Call: Pt has pelvic fracture and was given oxycodone for pain. Pt is still working and is unable to oxycodone while working. Pt requests something for pain that she will be able to take during the day while working and driving. Please advise.  Initial call taken by: Payton Spark CMA,  February 24, 2010 9:41 AM  Follow-up for Phone Call        Can try chaing to hydrocodone which is a step down from oxycodone. Has she had this before.  Follow-up by: Nani Gasser MD,  February 24, 2010 11:10 AM  Additional Follow-up for Phone Call Additional follow up Details #1::        Pt decided to try 1/2 tab oxycodone, if still too strong she will CB Additional Follow-up by: Payton Spark CMA,  February 24, 2010 1:05 PM

## 2011-01-21 NOTE — Progress Notes (Signed)
Summary: Mobic Rx  Phone Note Call from Patient   Caller: Patient Summary of Call: Pt would like refill on Mobic for low back and hip pain. Pt states since she has been working more, flexeril is not providing enough relief. Please advise. Initial call taken by: Payton Spark CMA,  September 29, 2010 2:25 PM    New/Updated Medications: MOBIC 15 MG TABS (MELOXICAM) 1 tab by mouth daily as needed for pain Prescriptions: MOBIC 15 MG TABS (MELOXICAM) 1 tab by mouth daily as needed for pain  #30 x 2   Entered and Authorized by:   Seymour Bars DO   Signed by:   Seymour Bars DO on 09/29/2010   Method used:   Electronically to        [facility]* (retail)       [address]       [place], [place]  [address]       Ph: [phone]       Fax: [phone]   RxID:   [id number]   Appended Document: Mobic Rx Pt aware

## 2011-01-21 NOTE — Progress Notes (Signed)
Summary: Anxiety  Phone Note Call from Patient   Caller: Patient Summary of Call: FYI- Pt states she has went to ED 2 x this past week for what the ED calls "anxiety". Pt c/o SOB and rapid heart rate. Both work ups were normal. I asked Pt if she wanted an apt but she declined. Pt states she will call if she needs anything.  Initial call taken by: Payton Spark CMA,  August 08, 2010 5:01 PM     Appended Document: Anxiety I am going to add some Alprazolam to use as needed for anxiety attacks.   Schedule OV in 2 wks to discuss.  Seymour Bars, D.O.  Appended Document: Anxiety   Appended Document: Anxiety Pt aware of the above

## 2011-01-21 NOTE — Assessment & Plan Note (Signed)
Summary: f/u back pain/ TSH   Vital Signs:  Patient profile:   55 year old female Height:      66.5 inches Weight:      169 pounds BMI:     26.97 O2 Sat:      94 % on Room air Pulse rate:   60 / minute BP sitting:   92 / 62  (left arm) Cuff size:   regular  Vitals Entered By: Payton Spark CMA (July 16, 2010 3:29 PM)  O2 Flow:  Room air CC: F/U pain. Would like to change oxycodone to a muscle relaxer.   Primary Care Provider:  Seymour Bars D.O.  CC:  F/U pain. Would like to change oxycodone to a muscle relaxer.Marland Kitchen  History of Present Illness: 55 yo WF presents for f/u interior pubic rami fracture.  She saw Dr Althea Charon and apparently her f/u Xray healed nicely.  She is no longer having pain here is is back to doing Zumba w/o any problem.  Her last DEXA was 1.5 yrs ago.  She does have osteopenia.  She is now on HRT thru her gyn office which has helped improve her mood.  She is taking Calcium with Vit D 2  x day.  She is still having LBP, s/p Harrington Rod placement.  She is no longer having pain warant taking Oxycodone.  She has used Motrin as needed for pain in her back.  Helps some.  Worse at the end of her shift at the [facility].  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  Considering visiting a chiropractor.  Denies radiation of pain, numbness or weakness into her buttocks or down her legs.         Current Medications (verified): 1)  Levothyroxine Sodium 75 Mcg  Tabs (Levothyroxine Sodium) .Marland Kitchen.. 1 Tab By Mouth Daily 2)  One-Daily Multivitamins   Tabs (Multiple Vitamin) .... Take 1 Tablet By Mouth Once A Day 3)  Oscal 500/200 D-3 500-200 Mg-Unit  Tabs (Calcium-Vitamin D) .... Take 1 Tablet By Mouth Two Times A Day 4)  Alprazolam 0.5 Mg Tabs (Alprazolam) .... 1/2 To 1 Tab By Mouth Two Times A Day As Needed Anxiety 5)  Fluoxetine Hcl 40 Mg Caps (Fluoxetine Hcl) .Marland Kitchen.. 1 Capsule By Mouth Daily 6)  Simvastatin 20 Mg Tabs (Simvastatin) .Marland Kitchen.. 1 Tab By Mouth Qhs 7)  Oxycodone-Acetaminophen 5-325 Mg Tabs  (Oxycodone-Acetaminophen) .... Take 1 Tab Every 4-6 Hours As Needed 8)  Macrodantin 50 Mg Caps (Nitrofurantoin Macrocrystal) .Marland Kitchen.. 1 Tab By Mouth Qpm With Dinner  Allergies (verified): 1)  ! Wellbutrin  Past History:  Past Medical History: Reviewed history from 12/19/2009 and no changes required.   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 Herrington Rod placement, back 1983 breast implants, saline 9-07 eyelid lift. laser resurfacing 05-2009 at [facility], post op MRSA infection  Social History: Reviewed history from 12/19/2009 and no changes required. Married.  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 no exercise recently.  Fair diet.     Review of Systems      See HPI  Physical Exam  General:  alert, well-developed, well-nourished, and well-hydrated.   Head:  normocephalic and atraumatic.   Neck:  no masses.   Lungs:  Normal respiratory effort, chest expands symmetrically. Lungs are clear to auscultation, no crackles or wheezes. Heart:  Normal  rate and regular rhythm. S1 and S2 normal without gallop, murmur, click, rub or other extra sounds. Msk:  full resisted strength with hip flexion and knee extension  Tender at L5- S1 midline with scar present.  full L spine ROM.  Neg seated straight leg raise. Extremities:  no LE edema Neurologic:  gait normal.   Skin:  color normal.   Psych:  good eye contact, not anxious appearing, and not depressed appearing.     Impression & Recommendations:  Problem # 1:  PELVIC FRACTURE (ICD-808.8) Much improved. F/U xray with Dr Althea Charon looked great. Doing well. DEXA in 1 yr.  Problem # 2:  LOW BACK PAIN, CHRONIC (ICD-724.2) Hx of scoliosis with rods,  likely with some DDD.  No radicular type symptoms.  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  Continue regular exercise.  Alternate Etodolac with Tylenol Extra Strength.  See chiropractor as needed.   The following medications were removed from the medication list:    Flexeril 10 Mg Tabs (Cyclobenzaprine hcl) .Marland Kitchen... Take 1 tablet by mouth once a day in pm as needed    Mobic 7.5 Mg Tabs (Meloxicam) .Marland Kitchen... 1-2 tabs by mouth daily with food for hip pain    Oxycodone-acetaminophen 5-325 Mg Tabs (Oxycodone-acetaminophen) .Marland Kitchen... Take 1 tab every 4-6 hours as needed Her updated medication list for this problem includes:    Tylenol Extra Strength 500 Mg Tabs (Acetaminophen) .Marland Kitchen... 2 tab by mouth three times a day as needed pain    Etodolac 400 Mg Tabs (Etodolac) .Marland Kitchen... 1 tab by mouth two times a day with food as needed for back pain  Problem # 3:  HYPOTHYROIDISM (ICD-244.9) Due for TSH today.   Her updated medication list for this problem includes:    Levothyroxine Sodium 75 Mcg Tabs (Levothyroxine sodium) .Marland Kitchen... 1 tab by mouth daily  Orders: T-TSH ([id number])  Complete Medication List: 1)  Levothyroxine Sodium 75 Mcg Tabs (Levothyroxine sodium) .Marland Kitchen.. 1 tab by mouth daily 2)  One-daily Multivitamins Tabs (Multiple vitamin) .... Take 1 tablet by mouth once a day 3)  Oscal 500/200 D-3 500-200 Mg-unit Tabs (Calcium-vitamin d) .... Take 1 tablet by mouth two times a day 4)  Fluoxetine Hcl 40 Mg Caps (Fluoxetine hcl) .Marland Kitchen.. 1 capsule by mouth daily 5)  Simvastatin 20 Mg Tabs (Simvastatin) .Marland Kitchen.. 1 tab by mouth qhs 6)  Macrodantin 50 Mg Caps (Nitrofurantoin macrocrystal) .Marland Kitchen.. 1 tab by mouth qpm with dinner 7)  Tylenol Extra Strength 500 Mg Tabs (Acetaminophen) .... 2 tab by mouth three times a day as needed pain 8)  Etodolac 400 Mg Tabs (Etodolac) .Marland Kitchen.. 1 tab by mouth two times a day with food as needed for back pain  Patient Instructions: 1)  TSH today. 2)  Will call you w/ results  tomorrow. 3)  Will RF your thyroid medicine after the lab comes back. 4)  Alternate Tylenol Extra STrength with Etodolac for back pain. 5)  EX: Take 1 Etodolac with breakfast, 2 Extra Strength Tyelnol at noon, 1 Etodolac with dinner and 2 Extra Strength Tylenol at bedtime. 6)  Keep up the good work with exercise. 7)  REturn for f/u back pain in 2 mos. Prescriptions: ETODOLAC 400 MG TABS (ETODOLAC) 1 tab by mouth two times a day with food as needed for back pain  #60 x 2   Entered and Authorized by:   Seymour Bars DO   Signed by:   Seymour Bars DO on 07/16/2010  Method used:   Electronically to        [facility]* (retail)       [address]       [place], [place]  [address]       Ph: [phone]       Fax: [phone]   RxID:   [id number]

## 2011-01-21 NOTE — Progress Notes (Signed)
Summary: Out of work  Phone Note Call from Patient   Caller: Patient Summary of Call: Pt would like to know how long you think she should stay out of work. Initial call taken by: Payton Spark CMA,  August 19, 2010 4:29 PM  Follow-up for Phone Call        how bout thru the end of next wk.   Follow-up by: Seymour Bars DO,  August 19, 2010 4:32 PM  Additional Follow-up for Phone Call Additional follow up Details #1::        Pt aware of the above Additional Follow-up by: Payton Spark CMA,  August 20, 2010 8:50 AM

## 2011-01-21 NOTE — Progress Notes (Signed)
Summary: Pain med not helping back pain  Phone Note Call from Patient Call back at Home Phone [phone]   Caller: Patient Call For: Seymour Bars DO Summary of Call: Pt calls and states that the med you gave her for her back pain is not helping- her pain level is a 6 and wanted to know if you could call in something else for her to [facility] Initial call taken by: Kathlene November LPN,  September 30, 2010 9:16 AM  Follow-up for Phone Call        I sent Meloxicam yesterday because she said it was helping ??? Follow-up by: Seymour Bars DO,  September 30, 2010 9:38 AM  Additional Follow-up for Phone Call Additional follow up Details #1::        She called back this morning and said that it is not helping her back pain Additional Follow-up by: Kathlene November LPN,  September 30, 2010 9:39 AM    Additional Follow-up for Phone Call Additional follow up Details #2::    I will send over short term use of pain meds but for long term, I'd recommend f/u with chiropractor. Follow-up by: Seymour Bars DO,  September 30, 2010 10:21 AM  New/Updated Medications: VICODIN 5-500 MG TABS (HYDROCODONE-ACETAMINOPHEN) 1-2 tabs by mouth two times a day as needed severe back pain Prescriptions: VICODIN 5-500 MG TABS (HYDROCODONE-ACETAMINOPHEN) 1-2 tabs by mouth two times a day as needed severe back pain  #50 x 0   Entered and Authorized by:   Seymour Bars DO   Signed by:   Seymour Bars DO on 09/30/2010   Method used:   Printed then faxed to ...       [facility]* (retail)       [address]       [place], [place]  [address]       Ph: [phone]       Fax: [phone]   RxID:   [id number]   Appended Document: Pain med not helping back pain Rx faxed  Appended Document: Pain med not helping back pain 09/30/2010 @ 10:32am- Pt notified of MD instructions. KJ LPN

## 2011-01-21 NOTE — Progress Notes (Signed)
Summary: CALL A NURSE  Phone Note From Other Clinic   Caller: CALL A NURSE Summary of Call: [facility] Triage Call Report Triage Record Num: [id number] Operator: Jeraldine Loots Patient Name: [name] Call Date & Time: 08/03/2010 3:44:14PM Patient Phone: [phone] PCP: Patient Gender: Female PCP Fax : Patient DOB: 10-01-1956 Practice Name: [facility] Reason for Call: Pt calling, completed prednisone yesterday and meds for an URI. Today is panting to breath. 911 disposition given. Protocol(s) Used: Breathing Problems Recommended Outcome per Protocol: Activate EMS 911 Reason for Outcome: New or worsening shortness of breath/difficulty breathing AND any other cardiac signs/symptoms for more than 5 minutes, now or within last hour Initial call taken by: Payton Spark CMA,  August 04, 2010 8:24 AM

## 2011-01-21 NOTE — Progress Notes (Signed)
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 She also needs note stating she is temp unable to perform some duties bc of fracture. Please advise. Pt would like faxed to [phone] Attn Malia Initial call taken by: Payton Spark CMA,  February 24, 2010 10:18 AM  Follow-up for Phone Call        I can write her a work restirction note but it only relates to her pelvic fracture NOT her scoliosis.   Follow-up by: Seymour Bars DO,  February 25, 2010 1:53 PM

## 2011-01-21 NOTE — Progress Notes (Signed)
Summary: Rx questions  Phone Note Call from Patient   Caller: Patient Summary of Call: Pt would like to know if she can change oxycodone to a muscle relaxer with the relief? Pt states she thinks the oxycodone is a little to strong and makes her sleepy. Please advise. Initial call taken by: Payton Spark CMA,  July 04, 2010 1:21 PM  Follow-up for Phone Call        She can certainly stop the Oxycodone and just use Tylenol + Flexeril as needed for pain.  Since it has been a while since I've seen her for this, she will need OV for any RX changes. Follow-up by: Seymour Bars DO,  July 04, 2010 1:51 PM     Appended Document: Rx questions Pt scheduled OV

## 2011-01-21 NOTE — Letter (Signed)
Summary: Work Excuse  [facility]  [address] [place] [address], [address]   [place], [place] [address]   Phone: [phone]  Fax: [phone]    Today's Date: February 25, 2010  Name of Patient: [name]  The above named patient had a medical visit today at:  am / pm.  Please take this into consideration when reviewing the time away from work/school.    Special Instructions:  No Lifting, pushing or pulling > 20 lbs from NOW through April 8th, 2011  [  ] None  [  ] To be off the remainder of today, returning to the normal work / school schedule tomorrow.  [  ] To be off until the next scheduled appointment on ______________________.  [  ] Other ________________________________________________________________ ________________________________________________________________________   Sincerely yours,   Seymour Bars DO

## 2011-01-21 NOTE — Assessment & Plan Note (Signed)
Summary: R hip pain   Vital Signs:  Patient profile:   55 year old female Height:      66.5 inches Weight:      172 pounds BMI:     27.44 O2 Sat:      98 % on Room air Temp:     98.7 degrees F oral Pulse rate:   77 / minute BP sitting:   107 / 71  (left arm) Cuff size:   regular  Vitals Entered By: Payton Spark CMA (February 17, 2010 3:49 PM)  O2 Flow:  Room air CC: R hip/groin pain. Getting worse, Back Pain   Primary Care Provider:  Seymour Bars D.O.  CC:  R hip/groin pain. Getting worse and Back Pain.  History of Present Illness: 55 yo WF presents for R hip and groin pain that started months ago.  Definitely worse w/ o NSAIDs.  She had Xrays over a year ago.  She has scoliosis and has a Harrington Rod.  She saw Dr Althea Charon 2 yrs ago for L ankle pain and is still wearing an ASO brace.  She has pain with walking and bearing weight that is limiting her ability to exercise.  She has Rx for Tylenol#3, Percocet and Flexeril.  She also has RX for Mobic has been helping.     Current Medications (verified): 1)  Levothyroxine Sodium 75 Mcg  Tabs (Levothyroxine Sodium) .Marland Kitchen.. 1 Tab By Mouth Daily 2)  Acetaminophen-Codeine #3 300-30 Mg  Tabs (Acetaminophen-Codeine) .... Take 1 Tablet By Mouth Two Times A Day As Needed 3)  Flexeril 10 Mg  Tabs (Cyclobenzaprine Hcl) .... Take 1 Tablet By Mouth Once A Day in Pm As Needed 4)  One-Daily Multivitamins   Tabs (Multiple Vitamin) .... Take 1 Tablet By Mouth Once A Day 5)  Oscal 500/200 D-3 500-200 Mg-Unit  Tabs (Calcium-Vitamin D) .... Take 1 Tablet By Mouth Two Times A Day 6)  Alprazolam 0.5 Mg Tabs (Alprazolam) .... 1/2 To 1 Tab By Mouth Two Times A Day As Needed Anxiety 7)  Fluoxetine Hcl 40 Mg Caps (Fluoxetine Hcl) .Marland Kitchen.. 1 Capsule By Mouth Daily 8)  Crestor 10 Mg Tabs (Rosuvastatin Calcium) .... Take 1 Tablet By Mouth Once A Day 9)  Mobic 7.5 Mg Tabs (Meloxicam) .Marland Kitchen.. 1-2 Tabs By Mouth Daily With Food For Hip Pain 10)  Oxycodone-Acetaminophen  5-325 Mg Tabs (Oxycodone-Acetaminophen) .... Take 1 Tab Every 4-6 Hours As Needed  Allergies (verified): 1)  ! Wellbutrin  Past History:  Past Medical History: Reviewed history from 12/19/2009 and no changes required.   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 Herrington Rod placement, back 1983 breast implants, saline 9-07 eyelid lift. laser resurfacing 05-2009 at [facility], post op MRSA infection  Social History: Reviewed history from 12/19/2009 and no changes required. Married.  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 no exercise recently.  Fair diet.     Review of Systems      See HPI  Physical Exam  General:  alert, well-developed, well-nourished, and well-hydrated.   Head:  normocephalic and atraumatic.   Msk:  severe thoracolumbar scoliosis with convexity to the R.  full R hip ROM with limp in gait and poor hip swing.  tender over R groin with negative FABER test.  full knee ROM.  full active L spine  ROM Extremities:  no LE edema Neurologic:  sensation intact to light touch.   Skin:  color normal.     Impression & Recommendations:  Problem # 1:  INGUINAL PAIN, RIGHT (ICD-789.09) R hip DJD with refered pain to R groin.  Xray today for progression. Has leg length discrepancy with limp in gait due to scoliosis.  Will probably need to see Sport med back for lift in shoes and R hip pain. Stay on Mobic daily with reserved use of Oxycodone for severe pain. The following medications were removed from the medication list:    Acetaminophen-codeine #3 300-30 Mg Tabs (Acetaminophen-codeine) .Marland Kitchen... Take 1 tablet by mouth two times a day as needed Her updated medication list for this problem  includes:    Flexeril 10 Mg Tabs (Cyclobenzaprine hcl) .Marland Kitchen... Take 1 tablet by mouth once a day in pm as needed    Mobic 7.5 Mg Tabs (Meloxicam) .Marland Kitchen... 1-2 tabs by mouth daily with food for hip pain    Oxycodone-acetaminophen 5-325 Mg Tabs (Oxycodone-acetaminophen) .Marland Kitchen... Take 1 tab every 4-6 hours as needed  Orders: T-DG Hip Complete*R* ([address])  Complete Medication List: 1)  Levothyroxine Sodium 75 Mcg Tabs (Levothyroxine sodium) .Marland Kitchen.. 1 tab by mouth daily 2)  Flexeril 10 Mg Tabs (Cyclobenzaprine hcl) .... Take 1 tablet by mouth once a day in pm as needed 3)  One-daily Multivitamins Tabs (Multiple vitamin) .... Take 1 tablet by mouth once a day 4)  Oscal 500/200 D-3 500-200 Mg-unit Tabs (Calcium-vitamin d) .... Take 1 tablet by mouth two times a day 5)  Alprazolam 0.5 Mg Tabs (Alprazolam) .... 1/2 to 1 tab by mouth two times a day as needed anxiety 6)  Fluoxetine Hcl 40 Mg Caps (Fluoxetine hcl) .Marland Kitchen.. 1 capsule by mouth daily 7)  Crestor 10 Mg Tabs (Rosuvastatin calcium) .... Take 1 tablet by mouth once a day 8)  Mobic 7.5 Mg Tabs (Meloxicam) .Marland Kitchen.. 1-2 tabs by mouth daily with food for hip pain 9)  Oxycodone-acetaminophen 5-325 Mg Tabs (Oxycodone-acetaminophen) .... Take 1 tab every 4-6 hours as needed  Patient Instructions: 1)  Xray R hip. 2)  Will call you w/ results tomorrow. 3)  Use Meloxicam daily for hip pain. 4)  Save the Oxycodone for severe pain. 5)  May need to get you back in with Dr Althea Charon. Prescriptions: OXYCODONE-ACETAMINOPHEN 5-325 MG TABS (OXYCODONE-ACETAMINOPHEN) take 1 tab every 4-6 hours as needed  #24 x 0   Entered and Authorized by:   Seymour Bars DO   Signed by:   Seymour Bars DO on 02/17/2010   Method used:   Print then Give to Patient   RxID:   [id number]

## 2011-01-21 NOTE — Letter (Signed)
Summary: [facility]  [facility]   Imported By: Lanelle Bal 05/22/2010 10:21:55  _____________________________________________________________________  External Attachment:    Type:   Image     Comment:   External Document

## 2011-01-21 NOTE — Assessment & Plan Note (Signed)
Summary: panic d/o   Vital Signs:  Patient profile:   55 year old female Height:      66.5 inches Weight:      166 pounds BMI:     26.49 O2 Sat:      100 % on Room air Pulse rate:   85 / minute BP sitting:   114 / 73  (left arm) Cuff size:   regular  Vitals Entered By: Payton Spark CMA (August 12, 2010 9:48 AM)  O2 Flow:  Room air CC: ? anxiety attacks   Primary Care Provider:  Seymour Bars D.O.  CC:  ? anxiety attacks.  History of Present Illness: On Saturday, August 13th the patient went to the hospital with continued dyspnea and was prescribed steriods because of the thought she had bronchitis.  She returned to the hospital on August 17th because she did not imrpoved after the course of steroids and they prescribed Alprazolam for the thought this may be anxiety provoking.  She reports she feels numb in her hands and face and experiences a tingling sensation and has palpitations during these events.  She denies feeling detacthed from herself, or an intense fear of dying or feels like she is losing control.   She denies agrophobia and believes she has had 3 or 4 of these events in the past week.  She claims to have had similar episodes in 2007 after her mother and father passed away.  Today she reports that she has recent stressors of her brother coming home from jail, trouble with finances with her families rental properties and the fact she is unhappy about her job.  She has not been to work in 2 weeks.  She reports recently having an increase in her dose of hormone replacement therapy for menapause symptoms.     Allergies: 1)  ! Wellbutrin  Physical Exam  General:  well-developed and well-hydrated.   Head:  normocephalic and atraumatic.   Mouth:  pharynx pink and moist.   Neck:  no masses.   Lungs:  Clear to auscultation bilaterally with no wheezes, crackles, rhonchi, and rales Heart:  normal rate, regular rhythm, no murmur, no gallop, and no rub.   Abdomen:  soft,  non-tender, normal bowel sounds, and no hepatomegaly.   Skin:  color normal.   Psych:  poor eye contact and moderately anxious.   Additional Exam:  Mental Status Exam Orientation: A&O x4 Appearance: Patient is breathing heavily during exam attempting to calm herself.  Well dressesd and well groomed.  Anxious appearing Behavior: Coroprative with exam.  Eye contact is decreased Mood: "Crappy" Affect: Congruent with mood, Depressed affect Thought Process: linear and goal directed Thought Content: Passive thoughts of death, no SI or HI Perceptions: No Auditory and Visual Hallucinations, or Ideas of Refrence Insight: Good Judgement: Good   Impression & Recommendations:  Problem # 1:  PANIC DISORDER (ICD-300.01)  Clearly, [name] has been worked up and ruled out for any cardiac or pulmonary issues related to her symptoms and has been under a great deal of stress.  Her husband is here w/ her today and is her support system.  I am going to change her Alprazolam to Klonopin q 12 hrs on a regular basis for now and will keep Fluoxetine on board given failure with Zoloft in the past and ongoing depression.  Referral will be made to psychology and psychiatry.  Call if any problems.   Her updated medication list for this problem includes:  Fluoxetine Hcl 40 Mg Caps (Fluoxetine hcl) .Marland Kitchen... 1 capsule by mouth daily    Klonopin 1 Mg Tabs (Clonazepam) .Marland Kitchen... 1/2 to 1 tab by mouth two times a day as needed anxiety  Time spent: 25 min.  Orders: Psychiatric Referral (Psych) Psychology Referral (Psychology)  Complete Medication List: 1)  Levothyroxine Sodium 75 Mcg Tabs (Levothyroxine sodium) .Marland Kitchen.. 1 tab by mouth daily 2)  One-daily Multivitamins Tabs (Multiple vitamin) .... Take 1 tablet by mouth once a day 3)  Oscal 500/200 D-3 500-200 Mg-unit Tabs (Calcium-vitamin d) .... Take 1 tablet by mouth two times a day 4)  Fluoxetine Hcl 40 Mg Caps (Fluoxetine hcl) .Marland Kitchen.. 1 capsule by mouth daily 5)   Simvastatin 20 Mg Tabs (Simvastatin) .Marland Kitchen.. 1 tab by mouth qhs 6)  Macrodantin 50 Mg Caps (Nitrofurantoin macrocrystal) .Marland Kitchen.. 1 tab by mouth qpm with dinner 7)  Tylenol Extra Strength 500 Mg Tabs (Acetaminophen) .... 2 tab by mouth three times a day as needed pain 8)  Etodolac 400 Mg Tabs (Etodolac) .Marland Kitchen.. 1 tab by mouth two times a day with food as needed for back pain 9)  Klonopin 1 Mg Tabs (Clonazepam) .... 1/2 to 1 tab by mouth two times a day as needed anxiety  Patient Instructions: 1)  Change Alprazolam to Klonopin. 2)  Start by taking 1/2 to 1 tab every 12 hrs on a regular basis x 2 wks then move to as needed. 3)  Will get you in with behavioral health downstairs. 4)  Fax or drop by [facility] papers and I will complete them for you. Prescriptions: KLONOPIN 1 MG TABS (CLONAZEPAM) 1/2 to 1 tab by mouth two times a day as needed anxiety  #40 x 0   Entered and Authorized by:   Seymour Bars DO   Signed by:   Seymour Bars DO on 08/12/2010   Method used:   Printed then faxed to ...       [facility]* (retail)       [address]       [place], [place]  [address]       Ph: [phone]       Fax: [phone]   RxID:   [id number]

## 2011-01-21 NOTE — Assessment & Plan Note (Signed)
Summary: urine cx only   Vital Signs:  Patient profile:   55 year old female Height:      66.5 inches Weight:      172 pounds BMI:     27.44 O2 Sat:      98 % on Room air Pulse rate:   57 / minute BP sitting:   111 / 64  (left arm) Cuff size:   regular  Vitals Entered By: Payton Spark CMA (Apr 30, 2010 2:05 PM)  O2 Flow:  Room air CC: F/U UTI   Primary Care Provider:  Seymour Bars D.O.  CC:  F/U UTI.  History of Present Illness: Pt just needed a UA with cx reflex today.  She is doing great.    Current Medications (verified): 1)  Levothyroxine Sodium 75 Mcg  Tabs (Levothyroxine Sodium) .Marland Kitchen.. 1 Tab By Mouth Daily 2)  Flexeril 10 Mg  Tabs (Cyclobenzaprine Hcl) .... Take 1 Tablet By Mouth Once A Day in Pm As Needed 3)  One-Daily Multivitamins   Tabs (Multiple Vitamin) .... Take 1 Tablet By Mouth Once A Day 4)  Oscal 500/200 D-3 500-200 Mg-Unit  Tabs (Calcium-Vitamin D) .... Take 1 Tablet By Mouth Two Times A Day 5)  Alprazolam 0.5 Mg Tabs (Alprazolam) .... 1/2 To 1 Tab By Mouth Two Times A Day As Needed Anxiety 6)  Fluoxetine Hcl 40 Mg Caps (Fluoxetine Hcl) .Marland Kitchen.. 1 Capsule By Mouth Daily 7)  Crestor 10 Mg Tabs (Rosuvastatin Calcium) .... Take 1 Tablet By Mouth Once A Day 8)  Mobic 7.5 Mg Tabs (Meloxicam) .Marland Kitchen.. 1-2 Tabs By Mouth Daily With Food For Hip Pain 9)  Oxycodone-Acetaminophen 5-325 Mg Tabs (Oxycodone-Acetaminophen) .... Take 1 Tab Every 4-6 Hours As Needed 10)  Macrodantin 50 Mg Caps (Nitrofurantoin Macrocrystal) .... Take 1 Cap By Mouth As Directed As Needed For Uti 11)  Tobrex 0.3 % Soln (Tobramycin Sulfate) .... 2 Drops in The Right Eye Q 4 Hrs X 1 Wk  Allergies (verified): 1)  ! Wellbutrin   Complete Medication List: 1)  Levothyroxine Sodium 75 Mcg Tabs (Levothyroxine sodium) .Marland Kitchen.. 1 tab by mouth daily 2)  Flexeril 10 Mg Tabs (Cyclobenzaprine hcl) .... Take 1 tablet by mouth once a day in pm as needed 3)  One-daily Multivitamins Tabs (Multiple vitamin) .... Take 1  tablet by mouth once a day 4)  Oscal 500/200 D-3 500-200 Mg-unit Tabs (Calcium-vitamin d) .... Take 1 tablet by mouth two times a day 5)  Alprazolam 0.5 Mg Tabs (Alprazolam) .... 1/2 to 1 tab by mouth two times a day as needed anxiety 6)  Fluoxetine Hcl 40 Mg Caps (Fluoxetine hcl) .Marland Kitchen.. 1 capsule by mouth daily 7)  Crestor 10 Mg Tabs (Rosuvastatin calcium) .... Take 1 tablet by mouth once a day 8)  Mobic 7.5 Mg Tabs (Meloxicam) .Marland Kitchen.. 1-2 tabs by mouth daily with food for hip pain 9)  Oxycodone-acetaminophen 5-325 Mg Tabs (Oxycodone-acetaminophen) .... Take 1 tab every 4-6 hours as needed 10)  Macrodantin 50 Mg Caps (Nitrofurantoin macrocrystal) .... Take 1 cap by mouth as directed as needed for uti 11)  Tobrex 0.3 % Soln (Tobramycin sulfate) .... 2 drops in the right eye q 4 hrs x 1 wk  Other Orders: UA Dipstick w/o Micro (automated)  (81003) T-Culture, Urine ([id number])  Patient Instructions: 1)  Will call you with urine culture results by Friday.  Laboratory Results   Urine Tests    Routine Urinalysis   Color: yellow Appearance: Clear Glucose: negative   (  Normal Range: Negative) Bilirubin: negative   (Normal Range: Negative) Ketone: negative   (Normal Range: Negative) Spec. Gravity: 1.010   (Normal Range: 1.003-1.035) Blood: trace-intact   (Normal Range: Negative) pH: 5.5   (Normal Range: 5.0-8.0) Protein: negative   (Normal Range: Negative) Urobilinogen: 0.2   (Normal Range: 0-1) Nitrite: negative   (Normal Range: Negative) Leukocyte Esterace: trace   (Normal Range: Negative)

## 2011-01-21 NOTE — Letter (Signed)
Summary: [facility]  [facility]   Imported By: Lanelle Bal 05/01/2010 12:58:30  _____________________________________________________________________  External Attachment:    Type:   Image     Comment:   External Document

## 2011-01-21 NOTE — Progress Notes (Signed)
Summary: Refil   Phone Note Call from Patient   Caller: Patient Summary of Call: Pt states she will have work send [employer]. She also states she is going to [place] until 09/08/10 and would like to know if you send a refill of clonazepam so she doesn't run out while out of town. Pt states she has 26 left but afraid she will run out while gone. Please advise. Initial call taken by: Payton Spark CMA,  August 21, 2010 10:07 AM  Follow-up for Phone Call        when does she leave? Follow-up by: Seymour Bars DO,  August 21, 2010 10:32 AM  Additional Follow-up for Phone Call Additional follow up Details #1::        Pt is leaving this Saturday morning Additional Follow-up by: Kathlene November,  August 21, 2010 10:38 AM    Additional Follow-up for Phone Call Additional follow up Details #2::    #30 RX sent to pharmacy today but her insurance may not cover it because she is not due. Follow-up by: Seymour Bars DO,  August 21, 2010 10:40 AM  Prescriptions: KLONOPIN 1 MG TABS (CLONAZEPAM) 1/2 to 1 tab by mouth two times a day as needed anxiety  #30 x 0   Entered and Authorized by:   Seymour Bars DO   Signed by:   Seymour Bars DO on 08/21/2010   Method used:   Printed then faxed to ...       [facility]* (retail)       [address]       [place], [place]  [address]       Ph: [phone]       Fax: [phone]   RxID:   [id number]   Appended Document: Refil  08/21/2010 @ 10:47am- Pt notified med sent to pharmacy.KJ LPN

## 2011-01-21 NOTE — Assessment & Plan Note (Signed)
Summary: f/u panic d/o   Vital Signs:  Patient profile:   55 year old female Height:      66.5 inches Weight:      166 pounds O2 Sat:      98 % on Room air Pulse rate:   76 / minute BP sitting:   100 / 62  (left arm) Cuff size:   regular  Vitals Entered By: Payton Spark CMA (September 16, 2010 4:07 PM)  O2 Flow:  Room air CC: F/U back pain    Primary Care Provider:  Seymour Bars D.O.  CC:  F/U back pain .  History of Present Illness: 55 yo WF presents for f/u mood and back pain.  She has started seeing a counselor in [place] for panic d/o.  She is on Fluoxetine once daily with use of Klonopin as needed.  She is only taking 1/2 tab each time.  C/O problems sleeping and when she takes a Klonopin to sleep, feels too sleepy in the AM.  She is not exercising though her back pain has improved mostly.  She reports having a relaxing cruise to [place] and feels better overall.  Supposed to go back to work Fri but is upset at her supervisor and was moved to 3rd shift.      Current Medications (verified): 1)  Levothyroxine Sodium 75 Mcg  Tabs (Levothyroxine Sodium) .Marland Kitchen.. 1 Tab By Mouth Daily 2)  One-Daily Multivitamins   Tabs (Multiple Vitamin) .... Take 1 Tablet By Mouth Once A Day 3)  Oscal 500/200 D-3 500-200 Mg-Unit  Tabs (Calcium-Vitamin D) .... Take 1 Tablet By Mouth Two Times A Day 4)  Fluoxetine Hcl 40 Mg Caps (Fluoxetine Hcl) .Marland Kitchen.. 1 Capsule By Mouth Daily 5)  Simvastatin 20 Mg Tabs (Simvastatin) .Marland Kitchen.. 1 Tab By Mouth Qhs 6)  Macrodantin 50 Mg Caps (Nitrofurantoin Macrocrystal) .Marland Kitchen.. 1 Tab By Mouth Qpm With Dinner 7)  Tylenol Extra Strength 500 Mg Tabs (Acetaminophen) .... 2 Tab By Mouth Three Times A Day As Needed Pain 8)  Klonopin 1 Mg Tabs (Clonazepam) .... 1/2 To 1 Tab By Mouth Two Times A Day As Needed Anxiety  Allergies (verified): 1)  ! Wellbutrin  Past History:  Past Medical History: Reviewed history from 12/19/2009 and no changes required.   DEPRESSION, RECURRENT  (ICD-311) SCOLIOSIS (ICD-737.30) OSTEOPENIA (ICD-733.90) ARTHRITIS, HIPS, BILATERAL (ICD-716.95) HYPOTHYROIDISM (ICD-244.9) URINARY TRACT INFECTION, RECURRENT (ICD-599.0) SACROILIITIS, RIGHT (ICD-720.2)  Past Surgical History: Reviewed history from 08/16/2009 and no changes required. Herrington Rod placement, back 1983 breast implants, saline 9-07 eyelid lift. laser resurfacing 05-2009 at [facility], post op MRSA infection  Social History: Reviewed history from 12/19/2009 and no changes required. Married.  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 no exercise recently.  Fair diet.     Review of Systems      See HPI  Physical Exam  General:  alert, well-developed, well-nourished, and well-hydrated.   Mouth:  pharynx pink and moist.   Neck:  no masses.   Lungs:  Clear to auscultation bilaterally with no wheezes, crackles, rhonchi, and rales Heart:  normal rate, regular rhythm, no murmur, no gallop, and no rub.   Msk:  wearing ankle brace on the L Extremities:  no LE edema Neurologic:  gait normal.   Skin:  color normal.   Psych:  good eye contact, not anxious appearing, and not depressed appearing.  improved!   Impression & Recommendations:  Problem # 1:  PANIC DISORDER (ICD-300.01) Assessment Improved Improved  on current meds and with counseling.  Advised a full tab at bedtime with improved sleep hygeine, taking tab around 8pm instead of 11 pm to help with AM sedation.  Work in increasing exercise and suggested looking for a new job given stress b/w she and her supervisor with change to 3rd shift.  Call bck with any problems, o/w RTC in 3 mos for GAD score/ PHQ -9 test. Her updated medication list for this problem includes:    Fluoxetine Hcl 40 Mg Caps (Fluoxetine hcl) .Marland Kitchen... 1 capsule by mouth daily    Klonopin 1 Mg Tabs (Clonazepam) .Marland Kitchen... 1/2 to 1 tab by mouth two times a day as needed anxiety  Complete Medication List: 1)  Levothyroxine Sodium 75 Mcg Tabs  (Levothyroxine sodium) .Marland Kitchen.. 1 tab by mouth daily 2)  One-daily Multivitamins Tabs (Multiple vitamin) .... Take 1 tablet by mouth once a day 3)  Oscal 500/200 D-3 500-200 Mg-unit Tabs (Calcium-vitamin d) .... Take 1 tablet by mouth two times a day 4)  Fluoxetine Hcl 40 Mg Caps (Fluoxetine hcl) .Marland Kitchen.. 1 capsule by mouth daily 5)  Simvastatin 20 Mg Tabs (Simvastatin) .Marland Kitchen.. 1 tab by mouth qhs 6)  Macrodantin 50 Mg Caps (Nitrofurantoin macrocrystal) .Marland Kitchen.. 1 tab by mouth qpm with dinner 7)  Tylenol Extra Strength 500 Mg Tabs (Acetaminophen) .... 2 tab by mouth three times a day as needed pain 8)  Klonopin 1 Mg Tabs (Clonazepam) .... 1/2 to 1 tab by mouth two times a day as needed anxiety  Patient Instructions: 1)  Stay on current meds. 2)  Continue counseling. 3)  Let's work on getting regular exercise.   4)  OK to take a full Klonopin at bedtime for sleep. 5)  Return for follow up in 3 mos.

## 2011-01-22 NOTE — Assessment & Plan Note (Signed)
Summary: viral bronchitis   Vital Signs:  Patient profile:   55 year old female Height:      66.5 inches Weight:      165 pounds BMI:     26.33 O2 Sat:      98 % on Room air Temp:     98.6 degrees F oral Pulse rate:   81 / minute BP sitting:   109 / 68  (left arm) Cuff size:   regular  Vitals Entered By: Payton Spark CMA (January 06, 2011 1:29 PM)  O2 Flow:  Room air  Serial Vital Signs/Assessments:  Comments: 1:57 PM PEAK FLOW 260 Yellow Zone By: Payton Spark CMA   CC: Congestion, fatigue, cough and runny nose x 8 days.   Primary Care Provider:  Seymour Bars D.O.  CC:  Congestion, fatigue, and cough and runny nose x 8 days.Marland Kitchen  History of Present Illness: 55 yo WF presents for some chest congestion with increased work of breathing one week ago with chills.  She has been coughing which is more productive.  She had nasal congestion but now has more rhinorrhea.  She has fatigue and malaise.  She has not been having HAs.  She has a scratchy throat.  Nyquil is helping her nighttime cough.  Tried Dayquil but this has not helped.    No GI upset.  She did not get a flu shot this year.  No recorded fevers.  Denies any bodyaches.  She is starting to feel better.    Current Medications (verified): 1)  Levothyroxine Sodium 75 Mcg  Tabs (Levothyroxine Sodium) .Marland Kitchen.. 1 Tab By Mouth Daily 2)  One-Daily Multivitamins   Tabs (Multiple Vitamin) .... Take 1 Tablet By Mouth Once A Day 3)  Oscal 500/200 D-3 500-200 Mg-Unit  Tabs (Calcium-Vitamin D) .... Take 1 Tablet By Mouth Two Times A Day 4)  Fluoxetine Hcl 20 Mg Caps (Fluoxetine Hcl) .... Take 3 Caps By Mouth Once Daily 5)  Simvastatin 20 Mg Tabs (Simvastatin) .Marland Kitchen.. 1 Tab By Mouth Qhs 6)  Macrodantin 50 Mg Caps (Nitrofurantoin Macrocrystal) .Marland Kitchen.. 1 Tab By Mouth Qpm With Dinner 7)  Tylenol Extra Strength 500 Mg Tabs (Acetaminophen) .... 2 Tab By Mouth Three Times A Day As Needed Pain 8)  Klonopin 1 Mg Tabs (Clonazepam) .... 1/2 To 1 Tab By  Mouth Two Times A Day As Needed Anxiety 9)  Vicodin 5-500 Mg Tabs (Hydrocodone-Acetaminophen) .Marland Kitchen.. 1-2 Tabs By Mouth Two Times A Day As Needed Severe Back Pain 10)  Prempro 0.625-2.5 Mg Tabs (Conj Estrog-Medroxyprogest Ace) .... Take 1 Tab By Mouth Once Daily  Allergies (verified): 1)  ! Wellbutrin  Past History:  Past Medical History: Reviewed history from 12/19/2009 and no changes required.   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 Married.  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 no exercise recently.  Fair diet.     Review of Systems      See HPI  Physical Exam  General:  alert, well-developed, well-nourished, and well-hydrated.   Head:  normocephalic and atraumatic.  sinuses NTTP Eyes:  conjunctiva clear Ears:  EACs patent; TMs translucent and gray with good cone of light and bony landmarks.  Nose:  scant nasal congestion with boggy turbinates. Mouth:  o/p mildly injected with clear postnasal drip Lungs:  Normal respiratory effort, chest  expands symmetrically. Lungs are clear to auscultation, no crackles or wheezes.  rhonchi with cough Heart:  Normal rate and regular rhythm. S1 and S2 normal without gallop, murmur, click, rub or other extra sounds. Skin:  color normal.   Cervical Nodes:  No lymphadenopathy noted   Impression & Recommendations:  Problem # 1:  BRONCHITIS, VIRAL (ICD-466.0)  Day 8 viral bronchitis.  Treat with supportive care measures including Mucinex DM in the AM and Nyquil at night.  PFs in yellow zone so will add a ProAir inhaler to use 2 puffs 4 x a day for the next 7 days then as needed for chest tightness/ SOB.  Call if not improved in 7-10 days.  Orders: Peak  Flow Rate (94150)  Complete Medication List: 1)  Levothyroxine Sodium 75 Mcg Tabs (Levothyroxine sodium) .Marland Kitchen.. 1 tab by mouth daily 2)  One-daily Multivitamins Tabs (Multiple vitamin) .... Take 1 tablet by mouth once a day 3)  Oscal 500/200 D-3 500-200 Mg-unit Tabs (Calcium-vitamin d) .... Take 1 tablet by mouth two times a day 4)  Fluoxetine Hcl 20 Mg Caps (Fluoxetine hcl) .... Take 3 caps by mouth once daily 5)  Simvastatin 20 Mg Tabs (Simvastatin) .Marland Kitchen.. 1 tab by mouth qhs 6)  Macrodantin 50 Mg Caps (Nitrofurantoin macrocrystal) .Marland Kitchen.. 1 tab by mouth qpm with dinner 7)  Tylenol Extra Strength 500 Mg Tabs (Acetaminophen) .... 2 tab by mouth three times a day as needed pain 8)  Klonopin 1 Mg Tabs (Clonazepam) .... 1/2 to 1 tab by mouth two times a day as needed anxiety 9)  Vicodin 5-500 Mg Tabs (Hydrocodone-acetaminophen) .Marland Kitchen.. 1-2 tabs by mouth two times a day as needed severe back pain 10)  Prempro 0.625-2.5 Mg Tabs (Conj estrog-medroxyprogest ace) .... Take 1 tab by mouth once daily  Patient Instructions: 1)  Use Mucinex DM in the morning and Nyquil at night for chest congestion and cough. 2)  Rest, clear fluids, cough drops as needed. 3)  Use ProAir inhaler 2 puffs 4 x a day for the next week, then use as needed. 4)  If you are not completely improved in 7-10 days, please let me know.   Orders Added: 1)  Est. Patient Level III [[address]40] 2)  Peak Flow Rate [94150]

## 2011-02-10 ENCOUNTER — Ambulatory Visit: Payer: 59 | Admitting: Obstetrics & Gynecology

## 2011-02-10 DIAGNOSIS — Z01419 Encounter for gynecological examination (general) (routine) without abnormal findings: Secondary | ICD-10-CM

## 2011-03-16 ENCOUNTER — Encounter (HOSPITAL_COMMUNITY): Payer: Self-pay | Admitting: Psychiatry

## 2011-03-17 ENCOUNTER — Encounter (HOSPITAL_COMMUNITY): Payer: Self-pay | Admitting: Psychiatry

## 2011-03-20 ENCOUNTER — Other Ambulatory Visit: Payer: Self-pay | Admitting: Family Medicine

## 2011-03-20 ENCOUNTER — Encounter (INDEPENDENT_AMBULATORY_CARE_PROVIDER_SITE_OTHER): Payer: 59 | Admitting: Psychiatry

## 2011-03-20 DIAGNOSIS — F411 Generalized anxiety disorder: Secondary | ICD-10-CM

## 2011-03-20 DIAGNOSIS — Z1231 Encounter for screening mammogram for malignant neoplasm of breast: Secondary | ICD-10-CM

## 2011-03-20 DIAGNOSIS — F339 Major depressive disorder, recurrent, unspecified: Secondary | ICD-10-CM

## 2011-03-24 ENCOUNTER — Ambulatory Visit
Admission: RE | Admit: 2011-03-24 | Discharge: 2011-03-24 | Disposition: A | Discharge status: Home / Self Care | Payer: 59 | Source: Ambulatory Visit | Attending: Family Medicine | Admitting: Family Medicine

## 2011-03-24 DIAGNOSIS — Z1231 Encounter for screening mammogram for malignant neoplasm of breast: Secondary | ICD-10-CM

## 2011-03-27 ENCOUNTER — Other Ambulatory Visit: Payer: Self-pay | Admitting: Family Medicine

## 2011-03-27 DIAGNOSIS — R928 Other abnormal and inconclusive findings on diagnostic imaging of breast: Secondary | ICD-10-CM

## 2011-04-03 ENCOUNTER — Ambulatory Visit
Admission: RE | Admit: 2011-04-03 | Discharge: 2011-04-03 | Disposition: A | Discharge status: Home / Self Care | Payer: 59 | Source: Ambulatory Visit | Attending: Family Medicine | Admitting: Family Medicine

## 2011-04-03 DIAGNOSIS — R928 Other abnormal and inconclusive findings on diagnostic imaging of breast: Secondary | ICD-10-CM

## 2011-05-05 NOTE — Assessment & Plan Note (Signed)
[name], [name]               ACCOUNT NO.:  [id number]   MEDICAL RECORD NO.:  [id number]          PATIENT TYPE:  POB   LOCATION:  [facility] [place]         FACILITY:  [facility]   PHYSICIAN:  Elsie Lincoln, MD      DATE OF BIRTH:  1956-09-16   DATE OF SERVICE:  11/19/2010                                  CLINIC NOTE   The patient is a 55 year old female who presents for test results.  The  patient has been having postmenopausal bleeding after 6 months of  hormone replacement therapy.  Endometrial biopsy showed no malignancy.  She also had an area on the right side of her cervix at approximately 9  o'clock which looked like a herpetic lesion, but showed just cervicitis  and reactive squamous epithelial  changes.  No squamous intraepithelial  lesion was identified.  She also had a wet prep done that showed a  moderate white blood cell.  The patient was treated empirically with  doxy 100 mg p.o. b.i.d. for 10 days.  However, she has not taken the  medication consistently.  She has not had any postmenopausal bleeding  since the week after Halloween.  Also of note, the patient has had a  transvaginal ultrasound which showed an endometrium of 4 mm in thickness  and normal uterus, except for 2 intramural fibroids that are small, 1.5  cm and 2.2 cm in maximum diameter.  The right ovary is normal.  The left  ovarian cyst is 8 mm with benign features and I am planning to follow  this up for at least 6 months and we will discuss if the patient like  minimal intervention.  The patient is complaining of decreased libido  where she states she is having marital problems with her husband.  The  patient is going to get some marital counseling sessions, so I think it  is highly beneficial to attend a counseling.  We discussed that there  were no medications for decreased libido, but did offer her testosterone  cream if she so desires, but told her there was no real scientific  benefit to have more  libido effect.   ASSESSMENT AND PLAN:  A 55 year old female with postmenopausal bleeding  on hormone replacement therapy.  1. Biopsy normal.  The patient is to keep a bleeding diary and come      back in 6 months.  2. Cervicitis.  The patient is treated empirically with doxycycline,      hopefully bleeding will improve.  3. An 8-mm left ovarian cyst, like to follow up in 6 months.  4. The patient received marital counseling for decreased libido.           ______________________________  Elsie Lincoln, MD     KL/MEDQ  D:  11/19/2010  T:  11/20/2010  Job:  [id number]

## 2011-05-05 NOTE — Assessment & Plan Note (Signed)
NAMEMARSELA, [name]               ACCOUNT NO.:  [id number]   MEDICAL RECORD NO.:  [id number]          PATIENT TYPE:  POB   LOCATION:  [facility] [employer]         FACILITY:  [facility]   PHYSICIAN:  Elsie Lincoln, MD      DATE OF BIRTH:  August 03, 1956   DATE OF SERVICE:  09/16/2010                                  CLINIC NOTE   The patient is a 55 year old female who presents for followup of  Prempro.  The patient was recently increased to 0.625/2.5 approximately  2 months ago.  She has been on Prempro since May.  She has not had any  spotting till approximately 2 to 3 weeks ago, she did skip a few doses.  I explained to her that bleeding on hormone replacement therapy can be  normal for 6 months after initiation when on continuous therapy.  However, since she did miss doses, it is more like she was having  withdrawal bleeding, so this can be the explanation; however, we never  can be sure unless we do an endometrial biopsy.  She is still in the  first 6 months of starting the therapy; however, I would expect her if  had the breakthrough bleeding more early on.  I offered her to do an  endometrial biopsy today or if the breakthrough bleeding continues past  Halloween, I would insist upon endometrial biopsy.  The patient would  like to hold off on endometrial biopsy and see if the bleeding  continues.  She thinks it is due to the skipped doses, I think this is  the reasonable plans.  She understands there is still a risk for  endometrial hyperplasia or malignancy.  She is up-to-date on her  mammogram and Pap smear.  She also got her flu shot today.  Blood  pressure today is 91/62 with a pulse of 98.  The patient will come back  in February for yearly exam or in early November if she is still having  vaginal bleeding.           ______________________________  Elsie Lincoln, MD     KL/MEDQ  D:  09/16/2010  T:  09/17/2010  Job:  [id number]

## 2011-05-05 NOTE — Assessment & Plan Note (Signed)
[name], Greene               ACCOUNT NO.:  [id number]   MEDICAL RECORD NO.:  [id number]          PATIENT TYPE:  POB   LOCATION:  [facility] [place]         FACILITY:  [facility]   PHYSICIAN:  Sid Falcon, CNM  DATE OF BIRTH:  07/24/1956   DATE OF SERVICE:                                  CLINIC NOTE   [name] is a 55 year old woman presenting for a Well-Woman Exam  and also complaints of decreased libido and painful intercourse.   MEDICAL HISTORY:  She is currently on medications for arthritis which  was diagnosed in October of 2008 on the right hip.  She is utilizing  exercise and Advil for the pain.  She also has high cholesterol that was  diagnosed in December of 2008 and modifying diet for that and she also  has history of hypothyroidism diagnosed in 1995 in which she is taking  Synthroid medication for.  She does have a primary care [name] named  Dr. __________ which she receives care for these issues.  She also has a  history of chronic bladder infection and she states that she is on  prophylaxis antibiotics after sex.  Prior to taking the antibiotics  after sex, she would give them 3 times year, now it is down to about  once a year.   SOCIAL HISTORY:  She is married x17 years, does not smoke, does not  drink, does not use any type of illicit drugs.  Denies being sexually or  physical abused and is not currently in an abuse relationship.   GYN HISTORY:  She has never been pregnant ever due to her and her  husband not wanting children.  At that time, her husband does have a  vasectomy and that is what they utilize for birth control.  Last Pap  smear was done 2007.  She has never had an abnormal Pap smear and her  last menstrual period was in 2003.  Her last mammogram she had was on  January 04, 2008, and waiting results and she does have a colonoscopy  scheduled for February 2009.   PRIOR SURGICAL HISTORY:  She did have a scoliosis repair.  Harrington  rod  was placed in 1983 and she also had breast implants in September  2007.   FAMILY HISTORY:  History of COPD in her family and __________ but what  type she was not sure of.   SYSTEMATIC REVIEW:  Patient reports frequent headaches after the death  of her parents in the past year with increased stress.  She gets  headaches once or twice a month and Advil improves the headaches.  She  has also started a new exercise program in which she does have some  shortness of breath that is improving with more exercise and then  painful intercourse for the last 2 to 3 months.  She has tried lubricant  but that has helped slightly.   PHYSICAL EXAM:  The patient was alert and oriented x3.  Well-developed,  well-nourished, appropriate-looking white female.  Appropriate  conservation.  VITAL SIGNS:  Stable.  Her blood pressure was 112/79.  Weight was 141.  Height 68 inches.  Pulse 66.  Temperature 97.4.  Checked her thyroid,  thyroid palpated normal without masses.  Nontender.  Appropriate size.  LUNGS:  Clear through auscultation bilaterally.  CVS SYSTEM:  Regular rate and rhythm.  No murmurs.  No gallops.  No  abnormal heart sounds.  ABDOMEN:  Her bowel sounds were present in all 4 quadrants.  No  hepatosplenomegaly.  GENITAL:  Her vagina had no lesions, no redness, no abnormal discharge.  There was minimal rugae within the vaginal canal.  Cervix with no  lesions, no abnormal discharge.  Negative CMP.  Cervical os was small.  Did obtain the Pap smear for that.  It was a nulliparous cervical os.  EXTREMITIES:  No edema.  No Homan's.  Negative overall negative exam.   ASSESSMENT:  Well-Woman Exam.  Also, decreased libido and painful  intercourse.   PLAN:  Continue to get your colonoscopy in February 2009.  Recommended  that she does physical therapy for the arthritis.  Continue to follow up  for other medical issues with your primary care [name].  I did obtain  a Pap smear.  We will call with  results.  Also, prescribed Estring and  Provera 10 mg b.i.d. x5 days for the first day of each month while using  the Estring and then I would like the patient to follow up in 3 months  for evaluation of treatment.      Sid Falcon, CNM     WM/MEDQ  D:  01/06/2008  T:  01/06/2008  Job:  [id number]

## 2011-05-05 NOTE — Assessment & Plan Note (Signed)
NAMEKELLE, RUPPERT               ACCOUNT NO.:  [id number]   MEDICAL RECORD NO.:  [id number]          PATIENT TYPE:  POB   LOCATION:  [facility] [place]         FACILITY:  [facility]   PHYSICIAN:  Elsie Lincoln, MD      DATE OF BIRTH:  24-Jul-1956   DATE OF SERVICE:  05/13/2010                                  CLINIC NOTE   The patient is a 55 year old female who is complaining of hot flashes.  The patient had hot flashes when she first went in menopause in her mid  to late 24s, it is now worsened age 21.  She also feels that she is  extremely grumpy.  She feels hot all the time.  She feels like it does  come in waves.  We discussed the benefits and risks on replacement  therapy.  She understands the risk of breast cancer, heart attack,  stroke, death.  She understands the benefits are quality of life and  protection from osteoporosis.  She understands that  mostly progesterone  is involved to protect from endometrial cancer.  She understands that  she will begin on the lowest dose for the minimal time. We will start  her on Prempro and see how she does.  If she is not better after month,  we can either go up or stop.   PAST MEDICAL HISTORY:  Was reviewed.  She has elevated cholesterol, hip  arthritis, hypothyroidism, chronic cystitis, decreased libido,  dyspareunia, right breast calcifications but she states that all  mammograms have been negative for cancer, strep, herpes, positive  culture from her buttocks.   PAST SURGICAL HISTORY:  Also review.  She has scoliosis repair with  Harrington rods, breasts implants in 2007, plastic surgery to her eyes,  and laser resurfacing of her face this year.   MEDICATIONS:  Reviewed today, cyclobenzaprine, multivitamin, calcium,  vitamin D, Xanax.  The patient is to stop the Estring and Provera.  Fluoxetine, HSV med, Crestor, hydroxyzine.   ASSESSMENT AND PLAN:  A 55 year old female with worsening hot flashes.  1. Benefits and risks of hormone  replacement therapy reviewed.  2. Prempro 0.3/1.5 prescription given.  3. Stop Estring and Provera.  4. Return to clinic in a month.           ______________________________  Elsie Lincoln, MD     KL/MEDQ  D:  05/13/2010  T:  05/14/2010  Job:  [id number]

## 2011-05-05 NOTE — Assessment & Plan Note (Signed)
[name], [name]               ACCOUNT NO.:  [id number]   MEDICAL RECORD NO.:  [id number]          PATIENT TYPE:  POB   LOCATION:  [facility] [place]         FACILITY:  [facility]   PHYSICIAN:  Caren Griffins, CNM       DATE OF BIRTH:  10/28/1956   DATE OF SERVICE:  04/10/2008                                  CLINIC NOTE   HISTORY:  This is a 55 year old nullip by choice who was seen for  decreased libido at her last visit and was started on Estring and  Provera which she has been taking as directed for the past three months.  She notices no change at all in her libido, although she has had more  vaginal discharges, not irritating or itching.  Of note, she has not  even attempted intercourse for the past two to three months because of  no libido.  She has been married for the past 17 years and is six years  menopausal.  She says her relationship is good.  Her husband is not  having any erectile dysfunction or other problems in terms of sex.  She  also does not have any vaginitis symptoms or urinary tract problems,  although she does have a history of chronic cystitis.  Both work full  time.  She says they do not argue except they have some issues about  money that has been longstanding.  They do not do much together,  although they have started to go to the gym together and are going to a  contra dance tonight.  He does bowling and watches a lot of TV.  She  does have some women friends and likes to read.  She wants to have more  libido herself as well as wanting to please her husband but does not  feel forced by him in any way.   OBJECTIVE:  BP 106/70, weight 138.  Physical exam is deferred.   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   PLAN:  Counseled in relational issues.  Need to spend more time together  that is protected and relaxed.  Need to communicate and reassured of the  commonous of her problem.  In consultation with Dr. Marice Potter, we are doing a  free testosterone today and  prescribing testosterone ointment 2% to  apply to genitalia, particularly clitoris, pea-sized amount, to start is  about three times a week and titrate back if she experiences any  symptoms such as hirsutism.  The patient is going to go ahead and stop  the Estring/Provera at this time and will use Estroglide or K-Y for  vaginal lubrication.   Follow up in three months to see how she is doing.           ______________________________  Caren Griffins, CNM     DP/MEDQ  D:  04/10/2008  T:  04/10/2008  Job:  [id number]

## 2011-05-05 NOTE — Assessment & Plan Note (Signed)
[name], [name]               ACCOUNT NO.:  [id number]   MEDICAL RECORD NO.:  [id number]          PATIENT TYPE:  POB   LOCATION:  [facility] [place]         FACILITY:  [facility]   PHYSICIAN:  Sid Falcon, CNM  DATE OF BIRTH:  October 15, 1956   DATE OF SERVICE:  01/07/2009                                  CLINIC NOTE   The patient is here for her annual well-woman exam, did report a  diagnosis of depression by her primary care Kai Calico for the last couple  of months and has began taking Prozac.  The patient is also reporting  that she is continuing to have decreasing libido, but is hoping that  once the depression medication starts to work that it will improve her  libido.   CURRENT MEDICATIONS:  Levothyroxine.  In the form of the multivitamin,  she takes calcium and vitamin D.  She takes Xanax 0.5 as needed, and now  she is on Prozac 25 mg daily and also takes Macrodantin as needed.  She  is not longer using the Estring, discontinued using that.   The patient also reports that she had a mammogram in September 2009.  On  repeat mammogram, they found some calcium deposits, but the followup  ultrasound was normal, and she is not due for another until September  2010.  The patient is receiving followup at Dr. Willeen Cass office for other  health conditions.  The patient denies any other concerns at this visit.   PHYSICAL EXAMINATION:  VITAL SIGNS:  The pulse is 65, blood pressure is  107/67, weight is 182 pounds, and height is 6 feet 8 inches.  GENERAL:  The patient is alert and oriented x3.  No signs of acute  distress.  Flat affect.  NECK:  No thyromegaly.  CHEST:  Lungs clear to auscultation bilaterally.  CARDIOVASCULAR SYSTEM:  Regular rate and rhythm without murmurs,  gallops, or rubs.  BREASTS:  Soft, nontender.  No dominant masses.  No retractions.  No  nipple discharge bilaterally.  No lymphadenopathy.  ABDOMEN:  No hepatosplenomegaly.  PELVIC:  Vagina is smooth.  No abnormal  lesions.  No abnormal discharge.  No redness.  Cervix, negative cervical motion tenderness.  No abnormal  discharge.  Pap smear obtained without difficulty.   ASSESSMENT:  1. Well-woman exam.  2. Depression.   PLAN:  To continue taking the medication for depression.  Please follow  up if the libido symptoms do not resolve with resolution of depression  symptoms.  Advised the patient to fax or to sign medical release to have  mammogram results sent over to our office.  The patient is continue to  take calcium and vitamin D supplements.   LABORATORY DATA:  Pap smear obtained and sent to lab, and the patient is  to follow up as needed.      Sid Falcon, CNM     WM/MEDQ  D:  01/07/2009  T:  01/08/2009  Job:  [id number]

## 2011-05-05 NOTE — Assessment & Plan Note (Signed)
NAMESEVEN, MARENGO               ACCOUNT NO.:  [id number]   MEDICAL RECORD NO.:  [id number]          PATIENT TYPE:  POB   LOCATION:  [facility] [place]         FACILITY:  [facility]   PHYSICIAN:  Sid Falcon, CNM  DATE OF BIRTH:  May 15, 1956   DATE OF SERVICE:  01/07/2009                                  CLINIC NOTE   ADDENDUM:   PHYSICAL EXAMINATION:  GU:  Uterus, nontender, no dominant masses,  midline.  Adnexa, nontender, no dominant masses, and nontender with  palpation.      Sid Falcon, CNM     WM/MEDQ  D:  01/07/2009  T:  01/08/2009  Job:  [id number]

## 2011-05-05 NOTE — Assessment & Plan Note (Signed)
NAMEZAARA, SPROWL               ACCOUNT NO.:  [id number]   MEDICAL RECORD NO.:  [id number]          PATIENT TYPE:  POB   LOCATION:  [facility] [place]         FACILITY:  [facility]   PHYSICIAN:  Elsie Lincoln, MD      DATE OF BIRTH:  04-29-1956   DATE OF SERVICE:  11/05/2010                                  CLINIC NOTE   The patient is a 55 year old female who has been on Prempro for  approximately 6 months.  The patient is still having bleeding and I  think at this time we need do a biopsy and an transvaginal ultrasound.  The patient was consented for the biopsy and placed in dorsal lithotomy  position.  Urine pregnancy test was negative.  A speculum was placed in  the vagina.  There was a reddish lesion to the right of the cervical os  around 9 o'clock.  This was also biopsied with the tissue forceps and  sent in separate container.  The uterus sounded to 7 cm.  Two passes  were made with the endometrial Pipelle that was sent to Pathology.   ASSESSMENT AND PLAN:  A 55 year old female with postmenopausal bleeding  on hormone replacement therapy.  1. Endometrial biopsy.  2. Cervical biopsy at 9 o'clock.  3. Transvaginal ultrasound.  4. Return to clinic in 2 weeks for results.           ______________________________  Elsie Lincoln, MD     KL/MEDQ  D:  11/05/2010  T:  11/05/2010  Job:  [id number]

## 2011-05-05 NOTE — Assessment & Plan Note (Signed)
[facility]                            CARDIOLOGY OFFICE NOTE   [name], [name]                        MRN:          [id number]  DATE:12/05/2008                            DOB:          Mar 15, 1956    The patient is a pleasant 55 year old female who presents for evaluation  of hyperlipidemia and mild dyspnea on exertion.  She has no prior  cardiac history.  Note, she typically does not have dyspnea on exertion,  orthopnea, PND, pedal edema, palpitations, presyncope, syncope, or  exertional chest pain.  Over the past several months, she has noticed a  mild increased dyspnea with more extreme activities but not with routine  activities.  However, she attributes this to deconditioning.  There is  no associated chest pain or productive cough.  She also has been noted  to have elevated lipids in the past.  Her total cholesterol previously  admitted in June 2009 showed a total cholesterol of 312 with a  triglyceride level of 191, HDL of 58, and LDL of 216.  Because of the  above issues, we were asked to further evaluate.   MEDICATIONS:  1. Levothyroxine 75 mcg p.o. daily.  2. Testosterone.  3. Provigil.  4. Sertraline has not been initiated as of yet.  5. Multivitamin.  6. Calcium.   She takes Tylenol #3 as needed as well as Macrodantin and alprazolam as  needed.   ALLERGIES:  She has no known drug allergies.   SOCIAL HISTORY:  She does not smoke.  She rarely consumes alcohol.  Note, she did quit her tobacco use in 1984.  She is married.   FAMILY HISTORY:  Significant for coronary disease in her mother, but  this was in her 16s.   PAST MEDICAL HISTORY:  There is no diabetes mellitus or hypertension.  She does have a history of hypothyroidism.  She has had previous back  surgery for scoliosis.  She has also had breast implants previously.  There is no other surgeries noted.  She does occasionally have bladder  infections.   REVIEW OF SYSTEMS:   She denies any headaches or fevers, chills.  There  is no productive cough or hemoptysis.  There is no dysphagia,  odynophagia, melena, or hematochezia.  There is no dysuria or hematuria.  There is no rash or seizure activity.  There is no orthopnea, PND, or  pedal edema.  Remaining systems are negative.   PHYSICAL EXAMINATION:  VITAL SIGNS:  Today shows a blood pressure of  102/65 and her pulse is 76.  GENERAL:  She is well-developed and well-nourished in no acute distress.  SKIN:  Warm and dry.  She does not appear to be depressed.  There is no  peripheral clubbing.  BACK:  Normal.  HEENT:  Normal with normal eyelids.  NECK:  Supple with normal upstroke bilaterally.  No bruits noted.  There  is no jugular distention.  I cannot appreciate thyromegaly.  CHEST:  Clear to auscultation.  No expansion.  CARDIOVASCULAR:  Regular rate and rhythm.  Normal S1, S2.  There are no  murmurs,  rubs, or gallops noted.  She has had previous breast implants.  ABDOMEN:  Nontender, nondistended.  Positive bowel sounds.  No  hepatosplenomegaly.  No masses appreciated.  There is no abdominal  bruit.  She has 2+ femoral pulses bilaterally.  No bruits.  EXTREMITIES:  No edema.  I could palpate no cords.  She has 2+ dorsalis  pedis pulses bilaterally.  NEUROLOGIC:  Grossly intact.   Her electrocardiogram shows a sinus rhythm at a rate of 61.  The axis is  normal.  There are no significant ST changes.   DIAGNOSES:  1. Mild dyspnea on exertion - This is most likely due to      deconditioning.  However, she wants to initiate an exercise      program.  We will plan to proceed with a stress echocardiogram      prior to initiation.  If it shows no abnormalities, then I think      she can proceed safely.  2. Hyperlipidemia - Her cholesterol has been extremely elevated in the      past.  However, she is hesitant to begin statins.  We have      discussed the plans today and we will begin with a heart healthy       diet for the next 3 months.  We will then check her lipids and if      they remain as elevated as previously, then she will consider a      statin at that point.  I have explained the risks of statins      including liver issues as well as myalgias, but I have also      explained the benefits.  I think she would most likely be in      agreement if her cholesterol does not improve.  3. History of urinary tract infections.  4. Hypothyroidism - She will continue on her levothyroxine.  Note, she      did have a TSH performed in June that was normal.   We will see her back in approximately 3 months after she has her lipids.     Madolyn Frieze Jens Som, MD, [facility]  Electronically Signed    BSC/MedQ  DD: 12/05/2008  DT: 12/06/2008  Job #: [id number]   cc:   Seymour Bars, D.O.

## 2011-05-05 NOTE — Assessment & Plan Note (Signed)
NAMERENEKA, [name]               ACCOUNT NO.:  [id number]   MEDICAL RECORD NO.:  [id number]          PATIENT TYPE:  POB   LOCATION:  [facility] [place]         FACILITY:  [facility]   PHYSICIAN:  Allie Bossier, MD        DATE OF BIRTH:  02-21-1956   DATE OF SERVICE:  01/14/2010                                  CLINIC NOTE   [name] is a 55 year old married white gravida 0 who comes in here  for annual exam.  She has no particular complaints today.   PAST MEDICAL HISTORY:  Elevated cholesterol, hip arthritis,  hypothyroidism, chronic cystitis, decreased libido/dyspareunia, right  breast calcifications, stress, herpes, culture positive from her  buttocks.   PAST SURGICAL HISTORY:  She had scoliosis repair with Harrington rods.  She has had breast implants in 2007.  She had plastic surgery to her  eyes and laser resurfacing over her face skin this year.   MEDICATIONS:  1. Levothyroxine 75 mcg daily.  2. Acetaminophen/Tylenol No. 3 p.r.n.  3. Cyclobenzaprine 10 mg.  4. Multivitamin daily.  5. Calcium daily.  6. Xanax 0.25 mg p.r.n.  7. She quit using the Estring and Provera.  8. Fluoxetine 40 mg daily.  9. She uses an unidentified herpes medication on a p.r.n. basis.   REVIEW OF SYSTEMS:  She is married, but very rarely sexually active.  Dr. Cathey Endow is her family doctor.  Her colonoscopy was done in 2009.  Her  mammogram is scheduled for next month.  Remainder of review of systems,  questions are negative.   PHYSICAL EXAMINATION:  GENERAL:  Well-nourished, well-hydrated, pleasant  white female.  VITAL SIGNS:  Height 5 feet 8 inches, weight 168, blood pressure 110/75,  pulse 66.  HEENT:  Normal.  BREASTS:  Normal, status post saline implants.  There are no palpable  masses, nipple discharges, or skin changes.  ABDOMEN:  Moderate amount of centripetal obesity.  There is no palpable  hepatosplenomegaly.  No ascites.  EXTERNAL GENITALIA:  Severe atrophy (she is not interested in  restarting  the vaginal estrogen).  Cervix appears nulliparous and without lesions.  Uterus is very small, anteverted, mobile.  Her adnexa are not palpable.  There are no masses appreciated in her pelvis.   ASSESSMENT AND PLAN:  1. Annual exam.  I have checked Pap smear.  She will get her own      mammogram.  I have recommended self-breast and self vulvar exams.  2. I encouraged her to start working out since she has had a 16-pound      weight gain in the last year, and she says her TSH was recently      checked and found to be normal.      Allie Bossier, MD     MCD/MEDQ  D:  01/14/2010  T:  01/15/2010  Job:  [id number]

## 2011-05-05 NOTE — Assessment & Plan Note (Signed)
NAMEDESSA, LEDEE               ACCOUNT NO.:  [id number]   MEDICAL RECORD NO.:  [id number]          PATIENT TYPE:  POB   LOCATION:  [facility] [place]         FACILITY:  [facility]   PHYSICIAN:  Elsie Lincoln, MD      DATE OF BIRTH:  [birth year]   DATE OF SERVICE:  06/25/2010                                  CLINIC NOTE   The patient is a 55 year old female who presents for followup of hormone  replacement therapy.  The patient will start on Prempro 0.3/1.5 mg.  On  May 13, 2010, the patient is having fewer hot flashes and less at night,  but was still like a little better controlled.  We are going to increase  her to 0.625/2.5 mg.  The patient still understands the risks of hormone  replacement therapy.  I did review her mammogram.  It is up-to-date.  We  will start her on this medication, have her come back in 2 months.  If  she is satisfied with this dose then, we will have her try this for a  year and then come back for yearly exam.  All questions answered and the  patient seems satisfied.           ______________________________  Elsie Lincoln, MD     KL/MEDQ  D:  06/25/2010  T:  06/26/2010  Job:  [id number]

## 2011-05-07 ENCOUNTER — Encounter: Payer: Self-pay | Admitting: Obstetrics & Gynecology

## 2011-05-29 ENCOUNTER — Encounter: Payer: Self-pay | Admitting: Family Medicine

## 2011-05-29 ENCOUNTER — Inpatient Hospital Stay (INDEPENDENT_AMBULATORY_CARE_PROVIDER_SITE_OTHER)
Admission: RE | Admit: 2011-05-29 | Discharge: 2011-05-29 | Disposition: A | Discharge status: Home / Self Care | Payer: 59 | Source: Ambulatory Visit | Attending: Family Medicine | Admitting: Family Medicine

## 2011-05-29 DIAGNOSIS — H9190 Unspecified hearing loss, unspecified ear: Secondary | ICD-10-CM

## 2011-06-01 ENCOUNTER — Ambulatory Visit: Payer: 59 | Admitting: Family Medicine

## 2011-06-15 ENCOUNTER — Encounter (INDEPENDENT_AMBULATORY_CARE_PROVIDER_SITE_OTHER): Payer: 59 | Admitting: Psychiatry

## 2011-06-15 DIAGNOSIS — F411 Generalized anxiety disorder: Secondary | ICD-10-CM

## 2011-06-15 DIAGNOSIS — F339 Major depressive disorder, recurrent, unspecified: Secondary | ICD-10-CM

## 2011-06-22 ENCOUNTER — Encounter (HOSPITAL_COMMUNITY): Payer: 59 | Admitting: Psychiatry

## 2011-07-14 ENCOUNTER — Other Ambulatory Visit: Payer: Self-pay | Admitting: *Deleted

## 2011-07-22 ENCOUNTER — Other Ambulatory Visit: Payer: Self-pay | Admitting: Family Medicine

## 2011-07-22 MED ORDER — HYDROCODONE-ACETAMINOPHEN 5-500 MG PO TABS: 1.0000 | ORAL_TABLET | Freq: Four times a day (QID) | ORAL | Status: DC | PRN

## 2011-07-22 NOTE — Telephone Encounter (Signed)
I will send her in #15 tabs and ask her to f/u for her back pain in the next 2 wks.

## 2011-07-22 NOTE — Telephone Encounter (Signed)
Pt calling for refill of her hydrocodone 5/500 mg and last office visit for back pain was on 07-16-2010.  Last refill was 12-03-2010.  Is it okay to give #30 and then have pt schedule an office visit for future refills?? Routed to Dr. Arlice Colt, LPN [facility]

## 2011-07-22 NOTE — Telephone Encounter (Signed)
Pt notified that script sent for #15 of the hydrocodone.  Pt told to sched an appt within the next two weeks.  Voiced understanding. Jarvis Newcomer, LPN [facility]

## 2011-07-24 ENCOUNTER — Other Ambulatory Visit: Payer: Self-pay | Admitting: Family Medicine

## 2011-07-24 NOTE — Telephone Encounter (Signed)
Pt called stating she had spoke to someone middle of this week and had requested her hydrocodone.  The pharm is saying they do not have. Plan:  Notified the pharm and gave them a verbal on the script that was sent on 07-22-11 for # 15.  Pt informed done. Jarvis Newcomer, LPN [facility]

## 2011-07-31 ENCOUNTER — Ambulatory Visit (INDEPENDENT_AMBULATORY_CARE_PROVIDER_SITE_OTHER): Payer: 59 | Admitting: Family Medicine

## 2011-07-31 ENCOUNTER — Encounter: Payer: Self-pay | Admitting: Family Medicine

## 2011-07-31 DIAGNOSIS — R140 Abdominal distension (gaseous): Secondary | ICD-10-CM

## 2011-07-31 DIAGNOSIS — M545 Low back pain: Secondary | ICD-10-CM

## 2011-07-31 DIAGNOSIS — R159 Full incontinence of feces: Secondary | ICD-10-CM

## 2011-07-31 DIAGNOSIS — Z130 Encounter for screening for diseases of the blood and blood-forming organs and certain disorders involving the immune mechanism: Secondary | ICD-10-CM

## 2011-07-31 DIAGNOSIS — E785 Hyperlipidemia, unspecified: Secondary | ICD-10-CM

## 2011-07-31 DIAGNOSIS — E039 Hypothyroidism, unspecified: Secondary | ICD-10-CM

## 2011-07-31 DIAGNOSIS — R141 Gas pain: Secondary | ICD-10-CM

## 2011-07-31 DIAGNOSIS — R5383 Other fatigue: Secondary | ICD-10-CM

## 2011-07-31 MED ORDER — LEVOTHYROXINE SODIUM 75 MCG PO TABS: 75.0000 ug | ORAL_TABLET | Freq: Every day | ORAL | Status: DC

## 2011-07-31 MED ORDER — HYDROCODONE-ACETAMINOPHEN 5-500 MG PO TABS: 1.0000 | ORAL_TABLET | Freq: Four times a day (QID) | ORAL | Status: DC | PRN

## 2011-07-31 MED ORDER — SIMVASTATIN 20 MG PO TABS: 20.0000 mg | ORAL_TABLET | Freq: Every day | ORAL | Status: DC

## 2011-07-31 NOTE — Patient Instructions (Signed)
Update fasting labs one morning. Will call you w/ results  For back pain: Continue chiropractic care. Use heating pad as needed.  Take Aleve 1-2 tabs by mouth 2 x a day with food + Vicodin for severe pain.

## 2011-08-02 ENCOUNTER — Encounter: Payer: Self-pay | Admitting: Family Medicine

## 2011-08-02 NOTE — Progress Notes (Signed)
  Subjective:    Patient ID: [name], female    DOB: 1956-02-16, 55 y.o.   MRN: [id number]  HPI 55 yo WF presents for acute on chronic LBP.  She has had back pain thru the years, with scoliosis s/p rod placement.  She has not been doing much exercise lately and had stopped seeing her chiropractor.  She has been taking Advil 3-4 x a day and RX vicodin 1-2 x most days.  She as just restarted chiropractic treatments 1 wk ago with Dr Arville Care.  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    She also notes that for several wks, she has had increase in bloating and gas and when she has gas, she has a small amt of feces that comes out involuntarily.  These stools are usually fairly loose.  Denies tinging or numbness or weakness into her legs.  She had a colonoscopy1-2 yrs ago.  Denies any change in her diet.    BP 106/76  Pulse 82  Ht 5\' 8"  (1.727 m)  Wt 167 lb (75.751 kg)  BMI 25.39 kg/m2  SpO2 94%    Review of Systems  Constitutional: Negative for fever, appetite change, fatigue and unexpected weight change.  Respiratory: Negative for shortness of breath.   Cardiovascular: Negative for chest pain and palpitations.  Gastrointestinal: Positive for abdominal distention. Negative for nausea, abdominal pain, diarrhea, constipation, blood in stool and rectal pain.  Genitourinary: Negative for enuresis and difficulty urinating.  Musculoskeletal: Negative for back pain.  Psychiatric/Behavioral: Negative for dysphoric mood.       Objective:   Physical Exam  Constitutional: She appears well-developed and well-nourished.  HENT:  Mouth/Throat: Oropharynx is clear and moist.  Eyes: No scleral icterus.  Neck: Neck supple. No thyromegaly present.  Cardiovascular: Normal rate, regular rhythm and normal heart sounds.   No murmur heard. Pulmonary/Chest: Effort normal and breath sounds normal.  Abdominal: Soft. Bowel sounds are normal. She exhibits no distension. There is no tenderness.  There is no guarding.  Musculoskeletal: She exhibits no edema.       Full active L spine ROM  Neurological: She has normal reflexes.       Gait normal  Skin: Skin is warm and dry.  Psychiatric: She has a normal mood and affect.          Assessment & Plan:

## 2011-08-03 ENCOUNTER — Encounter: Payer: Self-pay | Admitting: Family Medicine

## 2011-08-03 DIAGNOSIS — R140 Abdominal distension (gaseous): Secondary | ICD-10-CM | POA: Insufficient documentation

## 2011-08-03 NOTE — Assessment & Plan Note (Signed)
Has worsened with lack of regular exercise, sitting more at work and a lapse in chiropractic manipulation.  We discussed getting her back on track- a regimen of alternating NSAIDs with Vicodin, no more than 2 tabs/ day, chiropractic manipulation and regular exercise, home stretching.  She just had imaging done.

## 2011-08-03 NOTE — Assessment & Plan Note (Signed)
Increase in gas but no c/o pain.  She is having abd distension and some loss of feces with gas which is concerning.  She has rectal pressure so she gets a warning before passing gas, so this sensation is intact and is not c/w cauda equina.  i will set her back up with GI to follow.

## 2011-08-03 NOTE — Assessment & Plan Note (Signed)
Recheck TSH and RF levothyroxine

## 2011-08-07 ENCOUNTER — Telehealth: Payer: Self-pay | Admitting: Family Medicine

## 2011-08-07 ENCOUNTER — Telehealth: Payer: Self-pay | Admitting: *Deleted

## 2011-08-07 NOTE — Telephone Encounter (Signed)
Pt wants to know if you can add on Vitamin B level to her labs she had done today. Says she is always tired

## 2011-08-07 NOTE — Telephone Encounter (Signed)
Yes we can go ahead and add a vitamin B12 level. Use diagnoses for fatigue.

## 2011-08-07 NOTE — Telephone Encounter (Signed)
Pt called and someone from this office had tried to call her.  No telephone note listed to let me know who called her. Plan:  Reviewed and pt said she was to be referred for GI so looked at the current referral, and it looks like note said we need to find out from the pt which GI she saw 2 yrs ago.  Pt will gather that information and call back and give info to [facility] in referrals. Jarvis Newcomer, LPN [facility]

## 2011-08-08 LAB — COMPLETE METABOLIC PANEL WITH GFR
ALT: 9 U/L (ref 0–35)
AST: 16 U/L (ref 0–37)
Alkaline Phosphatase: 33 U/L — ABNORMAL LOW (ref 39–117)
CO2: 25 mEq/L (ref 19–32)
Creat: 0.89 mg/dL (ref 0.50–1.10)
GFR, Est African American: >60 mL/min (ref >60)
Sodium: 139 mEq/L (ref 135–145)
Total Bilirubin: 0.5 mg/dL (ref 0.3–1.2)
Total Protein: 6.6 g/dL (ref 6.0–8.3)

## 2011-08-08 LAB — LIPID PANEL
HDL: 61 mg/dL (ref >39)
LDL Cholesterol: 116 mg/dL — ABNORMAL HIGH (ref 0–99)
Total CHOL/HDL Ratio: 3.5 Ratio
Triglycerides: 175 mg/dL — ABNORMAL HIGH (ref <150)
VLDL: 35 mg/dL (ref 0–40)

## 2011-08-08 LAB — CBC WITH DIFFERENTIAL/PLATELET
Basophils Absolute: 0 10*3/uL (ref 0.0–0.1)
Eosinophils Absolute: 0.2 10*3/uL (ref 0.0–0.7)
Eosinophils Relative: 3 % (ref 0–5)
Lymphs Abs: 2.2 10*3/uL (ref 0.7–4.0)
MCH: 29.1 pg (ref 26.0–34.0)
Neutrophils Relative %: 54 % (ref 43–77)
Platelets: 271 10*3/uL (ref 150–400)
RBC: 4.57 MIL/uL (ref 3.87–5.11)
RDW: 14.2 % (ref 11.5–15.5)
WBC: 6.3 10*3/uL (ref 4.0–10.5)

## 2011-08-10 ENCOUNTER — Telehealth: Payer: Self-pay | Admitting: Family Medicine

## 2011-08-10 ENCOUNTER — Other Ambulatory Visit: Payer: Self-pay | Admitting: Family Medicine

## 2011-08-10 LAB — VITAMIN B12: Vitamin B-12: 1051 pg/mL — ABNORMAL HIGH (ref 211–911)

## 2011-08-10 MED ORDER — LEVOTHYROXINE SODIUM 75 MCG PO TABS: 75.0000 ug | ORAL_TABLET | Freq: Every day | ORAL | Status: DC

## 2011-08-10 MED ORDER — SIMVASTATIN 20 MG PO TABS: 20.0000 mg | ORAL_TABLET | Freq: Every day | ORAL | Status: DC

## 2011-08-10 NOTE — Telephone Encounter (Signed)
Pls let pt know that her thyroid is at goal on current dose of thyroid medication.  B12 is adequate, blood counts are normal.  Fasting sugar, liver and kidney function are normal.  Cholesterol OK other than elevated TGs on Simvastatin.  Continue simvastatin and work on [employer], regular exercise to get TGs down.

## 2011-08-10 NOTE — Telephone Encounter (Signed)
Called lab spoke with Aurther Loft and additional test added. KJ LPN

## 2011-08-10 NOTE — Telephone Encounter (Signed)
LMOM for Pt to CB 

## 2011-08-11 ENCOUNTER — Telehealth: Payer: Self-pay | Admitting: Family Medicine

## 2011-08-11 NOTE — Telephone Encounter (Signed)
Pt called and left voice mail mess wanting a return call.  Said she was returning Kim's call from yesterday regarding her lab results. Plan:  Pt call returned.  Lab results were discussed.  Recommendations given to the pt. Jarvis Newcomer, LPN [facility]'

## 2011-08-11 NOTE — Telephone Encounter (Signed)
Pt was informed of her lab results. Jarvis Newcomer, LPN [facility]

## 2011-08-14 ENCOUNTER — Encounter (INDEPENDENT_AMBULATORY_CARE_PROVIDER_SITE_OTHER): Payer: 59 | Admitting: Psychiatry

## 2011-08-14 DIAGNOSIS — F339 Major depressive disorder, recurrent, unspecified: Secondary | ICD-10-CM

## 2011-08-14 DIAGNOSIS — F411 Generalized anxiety disorder: Secondary | ICD-10-CM

## 2011-08-14 NOTE — Assessment & Plan Note (Signed)
[name], [name] NO.:  [id number]  MEDICAL RECORD NO.:  [id number]           PATIENT TYPE:  LOCATION:  [facility] at [place]           FACILITY:  PHYSICIAN:  Elsie Lincoln, MD      DATE OF BIRTH:  May 29, 1956  DATE OF SERVICE:  02/10/2011                                 CLINIC NOTE  The patient is a 55 year old female who presents for yearly exam.  The patient is doing much better after completing her exam.  Her libido is better.  She still has occasional bleeding, postmenopausal, on hormone replacement which probably is due to being on Prempro.  We have done endometrial biopsy which showed no malignancy, and her endometrial stripe was thin at 4 mm.  She does have a simple-appearing left ovarian cyst at 8 mm.  She is supposed to have a followup ultrasound in June 2012.  PAST MEDICAL HISTORY:  Reviewed.  She has hypercholesterolemia, arthritis, hypothyroidism, chronic cystitis, and decreased libido, dyspareunia, right breast calcifications.  PAST SURGICAL HISTORY:  Scoliosis repair with Harrington rods, breast implants in 2007, plastic surgery to her eyes and laser resurfacing of her face.  MEDICATIONS:  Levothyroxine, fluoxetine, Sandostatin, nitrofurantoin, hydrocodone with Tylenol, Prempro, clonazepam, multivitamin, calcium with vitamin D.  FAMILY HISTORY:  Noncontributory for cancer, blood clots, high blood pressure, heart disease, and diabetes.  It is positive for COPD and aneurysm.  PHYSICAL EXAMINATION:  VITAL SIGNS:  Pulse 65, blood pressure 113/72, weight 166, height 68 inches. GENERAL:  Well nourished, well developed, in no apparent distress. HEENT:  Normocephalic, atraumatic.  Good dentition. NECK:  Thyroid, no masses. LUNGS:  Clear to auscultation bilaterally. HEART:  Regular rate and rhythm. BREASTS:  No masses.  Evidence of implants.  No lymphadenopathy. ABDOMEN:  Soft, nontender.  No organomegaly, no hernia. GENITALIA:  Tanner V.   Vagina pink, normal rugae.  Cervix closed, nontender, small amount of old blood coming from the os.  Uterus anteverted, nontender.  Adnexa no masses, nontender.  Both ovaries felt. RECTOVAGINAL:  No rectocele.  No nodularity. EXTREMITIES:  No edema.  ASSESSMENT AND PLAN:  This is a 55 year old female for well-woman exam. 1. Pap smear not indicated as she has had one last year.  No history     of abnormal Pap smears. 2. Breast exam normal.  The patient needs mammogram. 3. Followup ultrasound in 6 months for ovarian cyst. 4. Continue with hormone replacement therapy.  This patient still has     hot flashes occasionally even on hormone replacement therapy.          ______________________________ Elsie Lincoln, MD    KL/MEDQ  D:  02/10/2011  T:  02/11/2011  Job:  [id number]

## 2011-10-07 ENCOUNTER — Encounter (HOSPITAL_COMMUNITY): Payer: Self-pay

## 2011-10-27 ENCOUNTER — Other Ambulatory Visit: Payer: Self-pay | Admitting: *Deleted

## 2011-10-27 MED ORDER — LEVOTHYROXINE SODIUM 75 MCG PO TABS: 75.0000 ug | ORAL_TABLET | Freq: Every day | ORAL | Status: DC

## 2011-11-16 ENCOUNTER — Encounter (HOSPITAL_COMMUNITY): Payer: 59 | Admitting: Psychiatry

## 2011-11-23 NOTE — Progress Notes (Signed)
Summary: LOSING HEARING IN RT EAR rm 2   Vital Signs:  Patient Profile:   55 Years Old Female CC:      RT ear  decreased hearing x today Height:     66.5 inches Weight:      165.50 pounds O2 Sat:      98 % O2 treatment:    Room Air Temp:     99.2 degrees F oral Pulse rate:   61 / minute Resp:     16 per minute BP sitting:   118 / 77  (left arm) Cuff size:   regular  Vitals Entered By: Clemens Catholic LPN (May 28, 6294 10:52 AM)                  Updated Prior Medication List: LEVOTHYROXINE SODIUM 75 MCG  TABS (LEVOTHYROXINE SODIUM) 1 tab by mouth daily ONE-DAILY MULTIVITAMINS   TABS (MULTIPLE VITAMIN) Take 1 tablet by mouth once a day OSCAL 500/200 D-3 500-200 MG-UNIT  TABS (CALCIUM-VITAMIN D) Take 1 tablet by mouth two times a day FLUOXETINE HCL 20 MG CAPS (FLUOXETINE HCL) Take 3 caps by mouth once daily SIMVASTATIN 20 MG TABS (SIMVASTATIN) 1 tab by mouth qhs MACRODANTIN 50 MG CAPS (NITROFURANTOIN MACROCRYSTAL) 1 tab by mouth qPM with dinner TYLENOL EXTRA STRENGTH 500 MG TABS (ACETAMINOPHEN) 2 tab by mouth three times a day as needed pain KLONOPIN 1 MG TABS (CLONAZEPAM) 1/2 to 1 tab by mouth two times a day as needed anxiety VICODIN 5-500 MG TABS (HYDROCODONE-ACETAMINOPHEN) 1-2 tabs by mouth two times a day as needed severe back pain PREMPRO 0.625-2.5 MG TABS (CONJ ESTROG-MEDROXYPROGEST ACE) Take 1 tab by mouth once daily  Current Allergies (reviewed today): ! WELLBUTRINHistory of Present Illness Chief Complaint: RT ear  decreased hearing x today History of Present Illness:  Subjective:  Patient believes that she got some water in her right ear about 2 weeks ago.  Recently she has had a "popping" sensation in her right ear and today she has had decreased hearing in her right ear.  No pain.  No sinus congestion.  No recent URI.  REVIEW OF SYSTEMS Constitutional Symptoms      Denies fever, chills, night sweats, weight loss, weight gain, and fatigue.  Eyes   Complains of glasses.      Denies change in vision, eye pain, eye discharge, contact lenses, and eye surgery. Ear/Nose/Throat/Mouth       Complains of change in hearing.      Denies hearing loss/aids, ear pain, ear discharge, dizziness, frequent runny nose, frequent nose bleeds, sinus problems, sore throat, hoarseness, and tooth pain or bleeding.  Respiratory       Denies dry cough, productive cough, wheezing, shortness of breath, asthma, bronchitis, and emphysema/COPD.  Cardiovascular       Denies murmurs, chest pain, and tires easily with exhertion.    Gastrointestinal       Denies stomach pain, nausea/vomiting, diarrhea, constipation, blood in bowel movements, and indigestion. Genitourniary       Denies painful urination, kidney stones, and loss of urinary control. Neurological       Complains of headaches.      Denies paralysis, seizures, and fainting/blackouts. Musculoskeletal       Denies muscle pain, joint pain, joint stiffness, decreased range of motion, redness, swelling, muscle weakness, and gout.  Skin       Denies bruising, unusual mles/lumps or sores, and hair/skin or nail changes.  Psych  Complains of anxiety/stress and depression.      Denies mood changes, temper/anger issues, speech problems, and sleep problems. Other Comments: pt c/o RT ear popping, ? water in her ear x 7days ago. today she has decreased hearing in her RT ear.   Past History:  Past Medical History: Reviewed history from 12/19/2009 and no changes required.   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 Herrington Rod placement, back 1983 breast implants, saline 9-07 eyelid lift. laser resurfacing 05-2009 at [facility], post op MRSA  infection  Family History: Reviewed history from 12/19/2009 and no changes required. Mother died with CAD in her 96s, COPD Father died at 74 with COPD. 2 brothers-1 with cocaine addiction 2 sisters      Social History: Reviewed history from 12/19/2009 and no changes required. Married.  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 no exercise recently.  Fair diet.      Objective:  Appearance:  Patient appears healthy, stated age, and in no acute distress  Eyes:  Pupils are equal, round, and reactive to light and accomodation.  Extraocular movement is intact.  Conjunctivae are not inflamed.  Ears:  Canals normal.  Tympanic membranes normal.   Nose:  No congestion.  No sinus tenderness  Pharynx:  Normal  Neck:  Supple.  No adenopathy is present.  No thyromegaly is present  Tympanogram normal right, high peak height left. Assessment New Problems: HEARING DEFICIT (ICD-389.9)  NO EVIDENCE OTITIS MEDIA.  ? OSSICULAR DISRUPTION  Plan New Orders: New Patient Level III [99203] Tympanometry [phone] Planning Comments:   Recommend evaluation by ENT   The patient and/or caregiver has been counseled thoroughly with regard to medications prescribed including dosage, schedule, interactions, rationale for use, and possible side effects and they verbalize understanding.  Diagnoses and expected course of recovery discussed and will return if not improved as expected or if the condition worsens. Patient and/or caregiver verbalized understanding.   Orders Added: 1)  New Patient Level III [01093] 2)  Tympanometry [23557]

## 2011-11-25 ENCOUNTER — Encounter (HOSPITAL_COMMUNITY): Payer: Self-pay | Admitting: Psychiatry

## 2011-11-25 ENCOUNTER — Ambulatory Visit (INDEPENDENT_AMBULATORY_CARE_PROVIDER_SITE_OTHER): Payer: 59 | Admitting: Psychiatry

## 2011-11-25 VITALS — BP 121/78 | Ht 68.5 in | Wt 170.0 lb

## 2011-11-25 DIAGNOSIS — F411 Generalized anxiety disorder: Secondary | ICD-10-CM

## 2011-11-25 DIAGNOSIS — F329 Major depressive disorder, single episode, unspecified: Secondary | ICD-10-CM

## 2011-11-25 NOTE — Progress Notes (Signed)
   [facility] Follow-up Outpatient Visit  [name] 21-Aug-1956   Subjective: This-year-old female who has been followed by [facility] since December 2011. She is currently diagnosed with major depressive disorder and anxiety disorder NOS. 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 She is working approximately 20 hours a week. She would like to be working more. The patient is back in her own bed. She is still talking in her sleep. She says that this is been going on for about 4-6 months. She is still picking occasionally. She feels it is a little bit better, but not gone. The patient has a new friend. She now has 2 friends who she scrapbooks with. Patient continues to be concerned about her brother. He has no money. His own as been turned off. He is contemplating starting her to the methadone clinic. He told his sister that he is buying more pain pills than he needs. He ended up putting his dog down because he could not afford the surgery that it needed. The patient wrote him a letter and told him that if she he asked for money when talking to her she will hang up and not speak to him for a week. She asks again she will hang up again and not speak to him per week. Filed Vitals:   11/25/11 1335  BP: 121/78    Mental Status Examination  Appearance: Casually dressed Alert: Yes Attention: good  Cooperative: Yes Eye Contact: Good Speech: Regular rate rhythm and volume Psychomotor Activity: Normal Memory/Concentration: Intact Oriented: person, place, time/date and situation Mood: Euthymic Affect: Full Range Thought Processes and Associations: Logical Fund of Knowledge: Fair Thought Content: No suicidal or homicidal thoughts Insight: Fair Judgement: Fair  Diagnosis: Maj. depressive disorder, recurrent, moderate, anxiety disorder NOS  Treatment Plan: At this point we'll not change any medications. I will see the patient  back in 3 months. Patient to call if she decides to start therapy. I recommended for her to see Olegario Messier.  Jamse Mead, MD

## 2011-12-17 ENCOUNTER — Other Ambulatory Visit (HOSPITAL_COMMUNITY): Payer: Self-pay | Admitting: Psychiatry

## 2011-12-17 DIAGNOSIS — F411 Generalized anxiety disorder: Secondary | ICD-10-CM

## 2011-12-17 MED ORDER — FLUOXETINE HCL 20 MG PO CAPS: 60.0000 mg | ORAL_CAPSULE | Freq: Every day | ORAL | Status: DC

## 2011-12-21 ENCOUNTER — Other Ambulatory Visit (HOSPITAL_COMMUNITY): Payer: Self-pay | Admitting: Psychiatry

## 2011-12-21 ENCOUNTER — Other Ambulatory Visit: Payer: Self-pay | Admitting: *Deleted

## 2011-12-21 MED ORDER — CLONAZEPAM 1 MG PO TABS: 0.5000 mg | ORAL_TABLET | Freq: Two times a day (BID) | ORAL | Status: DC | PRN

## 2011-12-21 MED ORDER — LEVOTHYROXINE SODIUM 75 MCG PO TABS: 75.0000 ug | ORAL_TABLET | Freq: Every day | ORAL | Status: DC

## 2011-12-23 ENCOUNTER — Ambulatory Visit (HOSPITAL_COMMUNITY): Payer: Self-pay | Admitting: Psychology

## 2011-12-23 ENCOUNTER — Ambulatory Visit (INDEPENDENT_AMBULATORY_CARE_PROVIDER_SITE_OTHER): Payer: 59 | Admitting: Psychology

## 2011-12-23 DIAGNOSIS — F329 Major depressive disorder, single episode, unspecified: Secondary | ICD-10-CM

## 2011-12-23 DIAGNOSIS — F3289 Other specified depressive episodes: Secondary | ICD-10-CM

## 2011-12-23 NOTE — Patient Instructions (Signed)
1- Think about the goals you want to address in therapy and come prepared to discuss at our next visit.
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
## 2011-12-24 ENCOUNTER — Encounter (HOSPITAL_COMMUNITY): Payer: Self-pay | Admitting: Psychology

## 2011-12-29 ENCOUNTER — Ambulatory Visit (HOSPITAL_COMMUNITY): Payer: Self-pay | Admitting: Psychology

## 2011-12-29 ENCOUNTER — Ambulatory Visit (INDEPENDENT_AMBULATORY_CARE_PROVIDER_SITE_OTHER): Payer: 59 | Admitting: Psychology

## 2011-12-29 DIAGNOSIS — F3289 Other specified depressive episodes: Secondary | ICD-10-CM

## 2011-12-29 DIAGNOSIS — F329 Major depressive disorder, single episode, unspecified: Secondary | ICD-10-CM

## 2011-12-29 DIAGNOSIS — F410 Panic disorder [episodic paroxysmal anxiety] without agoraphobia: Secondary | ICD-10-CM

## 2011-12-29 NOTE — Patient Instructions (Signed)
1- Create a list of positive thoughts. 2- Evaluate your thoughts.  When you notice an unhealthy thought I want you to stop the thought and insert a positive one. 3- Experiment with finding something that helps distract you from picking at your skin.

## 2011-12-29 NOTE — Progress Notes (Signed)
   THERAPIST PROGRESS NOTE  Session Time: 200- 250 pm  Participation Level: Active  Behavioral Response: Well GroomedAlertAnxious  Type of Therapy: Individual Therapy  Treatment Goals addressed: Anxiety and Coping  Interventions: CBT, Solution Focused, Strength-based, Psychosocial Skills: coping and Supportive  Summary: [name] is a 56 y.o. female who presents as pleasant and easily engaged.  She had a nice holiday with her husband.  She continues to worry about her brother [name] because of his drug use and expects that he is going to lose the family homeplace in [place] due to non-payment of taxes.  She knows she doesn't want to bail him out of the financial situation but also is grieving the loss of the home.  We discussed possible options for her to investigate regarding the home including letting it be lost to non-payment, buying the home back from [name], asking [name] to sign over the home and then pay the taxes, and to consider if buying the home sell it and put money away for [name] to be used to support him if she were to die.  The patient talked to her older brother [name] who commented that [name] would be better off dead and while she understands what he is saying this makes her very sad; she cried when she was talking about this.  The patient talked to someone from [facility] and thinks it would be helpful if she returned.  We talked about the benefits she received previously when engaged in this support and she reports it was very helpful.  I suggested she might need this support throughout her life when she struggles with feeling guilty about her brother's life.  I reminded her that he has a choice in how he handles/ conducts his own life.  This counselor and the client completed development of her treatment plan.  Suicidal/Homicidal: No  Plan: Return again in 2 weeks.  Diagnosis: Axis I: Generalized Anxiety Disorder, major depressive disorder    Axis II: No diagnosis    Salley Scarlet, [facility] 12/29/2011

## 2011-12-30 ENCOUNTER — Encounter (HOSPITAL_COMMUNITY): Payer: Self-pay | Admitting: Psychology

## 2012-01-01 ENCOUNTER — Emergency Department
Admission: EM | Admit: 2012-01-01 | Discharge: 2012-01-01 | Disposition: A | Discharge status: Home / Self Care | Payer: 59 | Source: Home / Self Care

## 2012-01-01 ENCOUNTER — Encounter: Payer: Self-pay | Admitting: *Deleted

## 2012-01-01 DIAGNOSIS — Z23 Encounter for immunization: Secondary | ICD-10-CM

## 2012-01-01 MED ORDER — INFLUENZA VAC TYP A&B SURF ANT IM INJ
0.5000 mL | INJECTION | Freq: Once | INTRAMUSCULAR | Status: AC
  Administered 2012-01-01: 0.5 mL via INTRAMUSCULAR

## 2012-01-13 ENCOUNTER — Ambulatory Visit (INDEPENDENT_AMBULATORY_CARE_PROVIDER_SITE_OTHER): Payer: 59 | Admitting: Psychology

## 2012-01-13 DIAGNOSIS — F411 Generalized anxiety disorder: Secondary | ICD-10-CM

## 2012-01-13 DIAGNOSIS — F3289 Other specified depressive episodes: Secondary | ICD-10-CM

## 2012-01-13 DIAGNOSIS — F329 Major depressive disorder, single episode, unspecified: Secondary | ICD-10-CM

## 2012-01-14 ENCOUNTER — Encounter (HOSPITAL_COMMUNITY): Payer: Self-pay | Admitting: Psychology

## 2012-01-14 DIAGNOSIS — F411 Generalized anxiety disorder: Secondary | ICD-10-CM | POA: Insufficient documentation

## 2012-01-14 NOTE — Progress Notes (Signed)
   THERAPIST PROGRESS NOTE  Session Time: 205- 300 pm  Participation Level: Active  Behavioral Response: CasualAlertEuthymic  Type of Therapy: Individual Therapy  Treatment Goals addressed: Communication: needs to husband and Coping  Interventions: Solution Focused, Strength-based, Psychosocial Skills: communication, boundary setting and Supportive  Summary: [name] is a 56 y.o. female who presents on time for her appointment.  She is pleasant and easily engaged and comes with her homework completed.  The patient reports she has done well in working through her thoughts and feelings related to not being able to save her brother and his ability to make his own choices even when they are not healthy.  She shared a situation that she dealt with this week that involved a lifelong friend calling her for money and her paying for this woman's doctor bills ($1,000).  She reports this caused issues in her marriage since her spouse was not in agreement with her decision and she told him she would pay him back from her inheritance.  The patient was angry with him and referred to him as stingy.  After a lengthy conversation about marital issue the patient admitted that he would help some people out but he did not want to help this person since she only calls when she needs something and is not a friend to the patient.  I suggested it sounded like her husband was trying to take care of her and she agreed this was true.  She is frustrated when he treats her like a child and won't allow her to use money in their account; I suggested she talk with him and learn more about the concerns.  Suicidal/Homicidal: No  Plan: Return again in 2 weeks.  Diagnosis: Axis I: Generalized Anxiety Disorder    Axis II: No diagnosis    Salley Scarlet, [facility] 01/14/2012

## 2012-01-27 ENCOUNTER — Encounter (HOSPITAL_COMMUNITY): Payer: Self-pay | Admitting: Psychology

## 2012-01-27 ENCOUNTER — Ambulatory Visit (INDEPENDENT_AMBULATORY_CARE_PROVIDER_SITE_OTHER): Payer: 59 | Admitting: Psychology

## 2012-01-27 DIAGNOSIS — F329 Major depressive disorder, single episode, unspecified: Secondary | ICD-10-CM

## 2012-01-27 DIAGNOSIS — F3289 Other specified depressive episodes: Secondary | ICD-10-CM

## 2012-01-27 DIAGNOSIS — F411 Generalized anxiety disorder: Secondary | ICD-10-CM

## 2012-01-27 NOTE — Patient Instructions (Signed)
1-Stay positively focused and use the list you created to help with this. 2-Talk with [name] and his treatment team about referral for Assertive Community Treatment Team services

## 2012-01-27 NOTE — Progress Notes (Signed)
   THERAPIST PROGRESS NOTE  Session Time: 210- 305 pm  Participation Level: Active  Behavioral Response: NeatAlertEuthymic  Type of Therapy: Individual Therapy  Treatment Goals addressed: Anxiety and Coping  Interventions: Solution Focused, Strength-based, Psychosocial Skills: coping, managing anxiety and Supportive  Summary: [name] is a 56 y.o. female who presents as pleasant and easily engaged.  She has had a tough couple weeks and thinks she has been getting more depressed.  She shared that her brother has been of significant concern to her and that he called her suicidal but that he was different this time and she knew she needed to get him help.  She contacted the police in [place] and they sent some officers out but he did not go to the hospital.  About one week later he took himself to the ED and was admitted.  Her brother called her and thanked her for caring and stated that he was doing better.  He waited to go to the hospital until his groceries were eaten because he could not afford to let them go to waste. He called their older brother and asked for $100 to pay for some cigarettes and other things and negotiated with his landlord because he was behind on the rent.  He also gave up his best friend, his dog, because he couldn't care for him very well.  She is proud of her brother for making these moves. He was discharged yesterday and is attending a day program.  I talked with her about how brave it was for her to make that call and she is glad she did since it caused him to decide he needed the hospital.  She has felt especially alone because her mother (who was her best friend) is not there to support her or help with decisions.  Her husband has been more supportive than usual and for this she is thankful.  She thinks that the lonely feelings and missing her mother, her brother's suicidal thinking and subsequent admission, and her friend being injured and her providing support  to her has sapped her; she contributes the depressed feelings to this.  I talked with the patient about how she has managed her depression in the past and what she can do to get herself back in a routine.  She was able to talk through a plan and is agreeable to implementing that plan.  Suicidal/Homicidal: No  Plan: Return again in 2 weeks.  Diagnosis: Axis I: Depressive Disorder NOS and Generalized Anxiety Disorder    Axis II: No diagnosis    Salley Scarlet, [facility] 01/27/2012

## 2012-02-17 ENCOUNTER — Ambulatory Visit (INDEPENDENT_AMBULATORY_CARE_PROVIDER_SITE_OTHER): Payer: 59 | Admitting: Psychology

## 2012-02-17 DIAGNOSIS — F3289 Other specified depressive episodes: Secondary | ICD-10-CM

## 2012-02-17 DIAGNOSIS — F411 Generalized anxiety disorder: Secondary | ICD-10-CM

## 2012-02-17 DIAGNOSIS — F410 Panic disorder [episodic paroxysmal anxiety] without agoraphobia: Secondary | ICD-10-CM

## 2012-02-17 DIAGNOSIS — F329 Major depressive disorder, single episode, unspecified: Secondary | ICD-10-CM

## 2012-02-17 NOTE — Patient Instructions (Signed)
1-Get back into [facility] for increased support to deal with issues with brother. 2-Read Safe People by [employer]. 3-Attend walking group two times per week as scheduled. 4-Plan a schedule to structure your time while your husband is working longer shifts.

## 2012-02-23 ENCOUNTER — Ambulatory Visit (HOSPITAL_COMMUNITY): Payer: Self-pay | Admitting: Psychiatry

## 2012-02-23 ENCOUNTER — Encounter (HOSPITAL_COMMUNITY): Payer: Self-pay | Admitting: Psychology

## 2012-02-23 NOTE — Progress Notes (Signed)
   THERAPIST PROGRESS NOTE  Session Time: 305- 400 pm  Participation Level: Active  Behavioral Response: NeatAlertEuthymic  Type of Therapy: Individual Therapy  Treatment Goals addressed: Anxiety, Communication: within family and Coping  Interventions: Solution Focused, Strength-based, Psychosocial Skills: setting boundaries, communicating needs and Supportive  Summary: [name] is a 56 y.o. female who presents as pleasant and easily engaged.  She is struggling to deal with issues related to her brother.  The patient is trying to stay active and has joined a walking group two days per week and has attended faithfully thus far.  We talked about the importance of staying up and out of the house to avoid increased depression, isolation, and feelings of hopelessness.  She provided an update on her brother who has been inpatient on two occasions in the past three weeks.  She was hopeful that he would go to inpatient rehab/treatment for his abuse of pain medications, however his insurance apparently lapsed and he has no coverage.  She comments that he has returned to the same drug using behaviors and is doing very poorly.  The patient feels stuck in the situation and isn't sure that she wants to proceed with paying off the taxes for (her mother's) home that was signed over to him.  She recently connected with her two sisters that she has been estranged and one has expressed interest in having her brother sign the property over to her and then she will sell it and use the money to pay her debt.  The patient had thought about this as well but she planned to keep the money to use when her brother required assistance.  She wonders now if she really wants anything to do with it since he has done little to prove that he really wants to get sober.  The patient's husband has been supportive of her but has told her that it upsets him to see how she gets pulled down by her brother and her (old) friend who  calls her when she needs money.  I talked with the patient about boundary setting with Bethann Berkshire and Darl Pikes and she admits this is hard because she has such a heart for hurting people.  I suggested that she look at caring for her needs first since she is accountable to herself first and then her marriage.  She admits that the issues with her brother have taken away from her taking care of herself and her husband.  Suicidal/Homicidal: No  Plan: Return again in 2 weeks.  Diagnosis: Axis I: Major depressive disorder, recurrent;  Anxiety Disorder NOS    Axis II: No diagnosis    Salley Scarlet, [facility] 02/23/2012

## 2012-03-02 ENCOUNTER — Ambulatory Visit (HOSPITAL_COMMUNITY): Payer: Self-pay | Admitting: Psychology

## 2012-03-08 ENCOUNTER — Other Ambulatory Visit: Payer: Self-pay | Admitting: *Deleted

## 2012-03-08 MED ORDER — HYDROCODONE-ACETAMINOPHEN 5-500 MG PO TABS: 1.0000 | ORAL_TABLET | Freq: Four times a day (QID) | ORAL | Status: DC | PRN

## 2012-03-08 NOTE — Telephone Encounter (Signed)
OK to refill

## 2012-03-08 NOTE — Telephone Encounter (Signed)
Pt is requesting refill for the Vicodin. States that she takes it for her scoliosis. Please advise.

## 2012-03-08 NOTE — Telephone Encounter (Signed)
RX sent and [facility]

## 2012-03-21 ENCOUNTER — Other Ambulatory Visit: Payer: Self-pay | Admitting: Family Medicine

## 2012-03-21 DIAGNOSIS — Z1231 Encounter for screening mammogram for malignant neoplasm of breast: Secondary | ICD-10-CM

## 2012-04-06 ENCOUNTER — Ambulatory Visit
Admission: RE | Admit: 2012-04-06 | Discharge: 2012-04-06 | Disposition: A | Discharge status: Home / Self Care | Payer: 59 | Source: Ambulatory Visit | Attending: Family Medicine | Admitting: Family Medicine

## 2012-04-06 DIAGNOSIS — Z1231 Encounter for screening mammogram for malignant neoplasm of breast: Secondary | ICD-10-CM

## 2012-04-20 ENCOUNTER — Ambulatory Visit (INDEPENDENT_AMBULATORY_CARE_PROVIDER_SITE_OTHER): Payer: 59 | Admitting: Obstetrics & Gynecology

## 2012-04-20 ENCOUNTER — Encounter: Payer: Self-pay | Admitting: Obstetrics & Gynecology

## 2012-04-20 VITALS — BP 114/78 | HR 73 | Temp 97.5°F | Resp 16 | Ht 68.0 in | Wt 167.0 lb

## 2012-04-20 DIAGNOSIS — Z1151 Encounter for screening for human papillomavirus (HPV): Secondary | ICD-10-CM

## 2012-04-20 DIAGNOSIS — Z124 Encounter for screening for malignant neoplasm of cervix: Secondary | ICD-10-CM

## 2012-04-20 DIAGNOSIS — Z113 Encounter for screening for infections with a predominantly sexual mode of transmission: Secondary | ICD-10-CM

## 2012-04-20 DIAGNOSIS — Z01419 Encounter for gynecological examination (general) (routine) without abnormal findings: Secondary | ICD-10-CM

## 2012-04-20 MED ORDER — CONJ ESTROG-MEDROXYPROGEST ACE 0.45-1.5 MG PO TABS: 1.0000 | ORAL_TABLET | Freq: Every day | ORAL | Status: DC

## 2012-04-20 NOTE — Patient Instructions (Signed)

## 2012-04-21 NOTE — Progress Notes (Signed)
  Subjective:    [name] is a 56 y.o. female who presents for an annual exam. The patient has no complaints today. The patient is sexually active. GYN screening history: last pap: was normal. The patient wears seatbelts: yes. The patient participates in regular exercise: yes. Has the patient ever been transfused or tattooed?: not asked. The patient reports that there is not domestic violence in her life.   Menstrual History: OB History    Grav Para Term Preterm Abortions TAB SAB Ect Mult Living   0              No LMP recorded. Patient is postmenopausal.    The following portions of the patient's history were reviewed and updated as appropriate: allergies, current medications, past family history, past medical history, past social history, past surgical history and problem list.  Review of Systems A comprehensive review of systems was negative.    Objective:    Marland Kitchen Vitals:  WNL General appearance: alert, cooperative and no distress Head: Normocephalic, without obvious abnormality, atraumatic Eyes: negative Throat: lips, mucosa, and tongue normal; teeth and gums normal Lungs: clear to auscultation bilaterally Breasts: normal appearance, no masses or tenderness, No nipple retraction or dimpling, No nipple discharge or bleeding.  Implants present Heart: regular rate and rhythm Abdomen: soft, non-tender; bowel sounds normal; no masses,  no organomegaly Pelvic: cervix normal in appearance, external genitalia normal, no adnexal masses or tenderness, no bladder tenderness, no cervical motion tenderness, perianal skin: no external genital warts noted, rectovaginal septum normal, urethra without abnormality or discharge, uterus normal size, shape, and consistency and vagina slightly atrophic. Extremities: no edema, redness or tenderness in the calves or thighs Skin: no lesions or rash Lymph nodes: Axillary adenopathy: none      Assessment:    Healthy female exam.    Plan:     Pap  smear. Pelvic ultrasound.  Pt wants to ween of HRT.  Prempro dose decreased.  Pt will call in 1 month and will lower to .3  if she is tolerating the .45 dose. TVUS to follow up ovarian cyst. Will assess bone health in 3 years after stopping hormones Continue calcium and vitamin D

## 2012-04-22 ENCOUNTER — Other Ambulatory Visit: Payer: Self-pay

## 2012-04-22 ENCOUNTER — Ambulatory Visit
Admission: RE | Admit: 2012-04-22 | Discharge: 2012-04-22 | Disposition: A | Discharge status: Home / Self Care | Payer: 59 | Source: Ambulatory Visit | Attending: Obstetrics & Gynecology | Admitting: Obstetrics & Gynecology

## 2012-04-22 DIAGNOSIS — Z01419 Encounter for gynecological examination (general) (routine) without abnormal findings: Secondary | ICD-10-CM

## 2012-04-25 ENCOUNTER — Other Ambulatory Visit: Payer: Self-pay | Admitting: *Deleted

## 2012-04-25 ENCOUNTER — Other Ambulatory Visit: Payer: Self-pay | Admitting: Family Medicine

## 2012-04-25 MED ORDER — FLUOXETINE HCL 20 MG PO CAPS: 60.0000 mg | ORAL_CAPSULE | Freq: Every day | ORAL | Status: DC

## 2012-05-02 ENCOUNTER — Telehealth: Payer: Self-pay | Admitting: *Deleted

## 2012-05-02 NOTE — Telephone Encounter (Signed)
LM on cell phone that her recent u/s ordered by Dr Penne Lash was neg for any ovarian cyst.  She is to f/u as needed.

## 2012-05-03 ENCOUNTER — Other Ambulatory Visit: Payer: Self-pay | Admitting: *Deleted

## 2012-05-03 MED ORDER — FLUOXETINE HCL 20 MG PO CAPS: 60.0000 mg | ORAL_CAPSULE | Freq: Every day | ORAL | Status: DC

## 2012-05-04 ENCOUNTER — Other Ambulatory Visit: Payer: Self-pay | Admitting: *Deleted

## 2012-05-04 MED ORDER — FLUOXETINE HCL 20 MG PO CAPS: 60.0000 mg | ORAL_CAPSULE | Freq: Every day | ORAL | Status: DC

## 2012-05-09 ENCOUNTER — Other Ambulatory Visit: Payer: Self-pay | Admitting: *Deleted

## 2012-05-09 MED ORDER — SIMVASTATIN 20 MG PO TABS: 20.0000 mg | ORAL_TABLET | Freq: Every day | ORAL | Status: DC

## 2012-05-19 ENCOUNTER — Encounter (HOSPITAL_COMMUNITY): Payer: Self-pay | Admitting: Psychiatry

## 2012-06-10 ENCOUNTER — Ambulatory Visit (INDEPENDENT_AMBULATORY_CARE_PROVIDER_SITE_OTHER): Payer: 59 | Admitting: Family Medicine

## 2012-06-10 VITALS — Temp 98.3°F

## 2012-06-10 DIAGNOSIS — R300 Dysuria: Secondary | ICD-10-CM

## 2012-06-10 DIAGNOSIS — N390 Urinary tract infection, site not specified: Secondary | ICD-10-CM

## 2012-06-10 LAB — POCT URINALYSIS DIPSTICK
Glucose, UA: NEGATIVE
Nitrite, UA: NEGATIVE
Protein, UA: NEGATIVE
Spec Grav, UA: 1.01
Urobilinogen, UA: 0.2
pH, UA: 6.5

## 2012-06-10 MED ORDER — CIPROFLOXACIN HCL 500 MG PO TABS: 500.0000 mg | ORAL_TABLET | Freq: Two times a day (BID) | ORAL | Status: AC

## 2012-06-10 NOTE — Progress Notes (Signed)
Patient ID: [name], female   DOB: [birth year], 56 y.o.   MRN: [id number] Pt here for f/u on meds and dysuria. Unfortunately there was a scheduling error and she had to resched for next week. Urine collected and will send for culture. Pt states she takes daily macrodantin but Rx has expired.     UTI- Will tx with cipro 500mg  bid x 3 days.  Call if sxs npot resolved. Culture send. Has f/u on Mondya to discuss prophylaxis.  Cipriano Bunker, MD

## 2012-06-14 LAB — URINE CULTURE: Colony Count: >=100000

## 2012-06-15 ENCOUNTER — Ambulatory Visit (INDEPENDENT_AMBULATORY_CARE_PROVIDER_SITE_OTHER): Payer: 59 | Admitting: Physician Assistant

## 2012-06-15 DIAGNOSIS — N390 Urinary tract infection, site not specified: Secondary | ICD-10-CM

## 2012-06-15 MED ORDER — CEFTRIAXONE SODIUM 1 G IJ SOLR
1.0000 g | Freq: Once | INTRAMUSCULAR | Status: AC
  Administered 2012-06-15: 1 g via INTRAMUSCULAR

## 2012-06-15 NOTE — Progress Notes (Signed)
  Subjective:    Patient ID: [name], female    DOB: 04-26-1956, 56 y.o.   MRN: [id number]  HPI   Pt here for a rocephin injection Review of Systems     Objective:   Physical Exam        Assessment & Plan:  Patient comes in to get Rocephin injection since urine culture showed resistance to cipro and pt is allergic to sulfa drugs. Tandy Gaw PA-C

## 2012-06-24 ENCOUNTER — Ambulatory Visit (INDEPENDENT_AMBULATORY_CARE_PROVIDER_SITE_OTHER): Payer: 59 | Admitting: Family Medicine

## 2012-06-24 ENCOUNTER — Encounter: Payer: Self-pay | Admitting: Family Medicine

## 2012-06-24 VITALS — BP 120/82 | HR 95 | Ht 68.0 in | Wt 168.0 lb

## 2012-06-24 DIAGNOSIS — M545 Low back pain: Secondary | ICD-10-CM

## 2012-06-24 DIAGNOSIS — N390 Urinary tract infection, site not specified: Secondary | ICD-10-CM

## 2012-06-24 DIAGNOSIS — M4120 Other idiopathic scoliosis, site unspecified: Secondary | ICD-10-CM

## 2012-06-24 DIAGNOSIS — E039 Hypothyroidism, unspecified: Secondary | ICD-10-CM

## 2012-06-24 DIAGNOSIS — S6990XA Unspecified injury of unspecified wrist, hand and finger(s), initial encounter: Secondary | ICD-10-CM

## 2012-06-24 DIAGNOSIS — F329 Major depressive disorder, single episode, unspecified: Secondary | ICD-10-CM

## 2012-06-24 DIAGNOSIS — Z23 Encounter for immunization: Secondary | ICD-10-CM

## 2012-06-24 DIAGNOSIS — S6980XA Other specified injuries of unspecified wrist, hand and finger(s), initial encounter: Secondary | ICD-10-CM

## 2012-06-24 DIAGNOSIS — E785 Hyperlipidemia, unspecified: Secondary | ICD-10-CM

## 2012-06-24 MED ORDER — FLUOXETINE HCL 20 MG PO CAPS: 80.0000 mg | ORAL_CAPSULE | Freq: Every day | ORAL | Status: DC

## 2012-06-24 MED ORDER — NITROFURANTOIN MACROCRYSTAL 50 MG PO CAPS: 50.0000 mg | ORAL_CAPSULE | Freq: Every day | ORAL | Status: AC

## 2012-06-24 MED ORDER — HYDROCODONE-ACETAMINOPHEN 10-325 MG PO TABS: 1.0000 | ORAL_TABLET | Freq: Three times a day (TID) | ORAL | Status: DC | PRN

## 2012-06-24 MED ORDER — CLONAZEPAM 0.5 MG PO TABS: 0.5000 mg | ORAL_TABLET | Freq: Every day | ORAL | Status: DC | PRN

## 2012-06-24 NOTE — Progress Notes (Signed)
  Subjective:    Patient ID: [name], female    DOB: April 20, 1956, 56 y.o.   MRN: [id number]  HPI She is primarily here for followup on depression, thyroid and cholesterol.  Larey Seat getting out of bed and hit her finger on a chair.  Started swelling. Went to [employer] and to have ring cut off today.  She iced it last night. Elevated last night.  It is still painful to touch but she can move it. No laceration of the scan.  Hypothyroid - no skin or hair changes.  No weight changes.  Doing well on medication. She needs a refill.  Depression - dong well on the fluoxetine. Says more apathy the last 2 months remembering the death of her mother and  Father.  This is around the time that both of them passed away.  She says she always struggles this time of year.  Hyperlipidemia - Doing well on simvastatin. no myalgias or side effects. No regular exercise.   She does have a history of UTIs after and worse. She's been prescribed nitrofurantoin in the past and brought her own bottle today. She does not take it every night but only after intercourse and it seems to work well for her. She would like a refill.  Review of Systems     Objective:   Physical Exam  Constitutional: She is oriented to person, place, and time. She appears well-developed and well-nourished.  HENT:  Head: Normocephalic and atraumatic.  Neck: Neck supple. No thyromegaly present.  Cardiovascular: Normal rate, regular rhythm and normal heart sounds.   Pulmonary/Chest: Effort normal and breath sounds normal.  Musculoskeletal: She exhibits no edema.       Pinkie finger on the left hand is significantly bruised all over. She has slightly decreased flexion and full extension.it is swollen and has an intention where she previously had a ring.   Lymphadenopathy:    She has no cervical adenopathy.  Neurological: She is alert and oriented to person, place, and time.  Skin: Skin is warm and dry.  Psychiatric: She has a normal mood and  affect. Her behavior is normal.          Assessment & Plan:  Finger strain - NROm but very swollen. She wants to hold off on xray at this time. Encouraged her to ice it and take anti-inflammatory as needed. It is not improving in the next couple weeks then please call if she would like to move forward with getting an x-ray to rule out fracture.  Hypothyroid-currently asymptomatic. Check TSH today. She will need refills once we get her lab work back. She would prefer 90 day supplies.  Depression - she is Re: on 80 mg of fluoxetine. Refill sent to pharmacy. Consider counseling then she is going through more stressful period right now.  Hyperlipidemia- Doing well. Due to recheck levels. Adjust dose if her LDL is not under 100.  UTI prophylaxis-I. did refill her Macrodantin.  Scoliosis with chronic low back pain. She has been seeing a chiropractor fairly regularly and has been helpful. She does use Vicodin as needed. Her last prescription was filled back in March for 40 tabs and it has lasted thus far. She says she wants and if we can move to something slightly stronger and she would like a refill. I did increase her dose to 10/325 and refill for 40 tablets. Hopefully this will last her another 3-4 months as well. Tdap updated today.

## 2012-07-07 ENCOUNTER — Other Ambulatory Visit: Payer: Self-pay | Admitting: Family Medicine

## 2012-07-07 LAB — COMPLETE METABOLIC PANEL WITH GFR
Albumin: 4.5 g/dL (ref 3.5–5.2)
Alkaline Phosphatase: 48 U/L (ref 39–117)
BUN: 16 mg/dL (ref 6–23)
CO2: 28 mEq/L (ref 19–32)
Calcium: 9.8 mg/dL (ref 8.4–10.5)
Chloride: 101 mEq/L (ref 96–112)
GFR, Est African American: >89 mL/min
GFR, Est Non African American: 89 mL/min
Glucose, Bld: 82 mg/dL (ref 70–99)
Potassium: 4.5 mEq/L (ref 3.5–5.3)
Sodium: 139 mEq/L (ref 135–145)
Total Protein: 7.1 g/dL (ref 6.0–8.3)

## 2012-07-07 LAB — LIPID PANEL: Cholesterol: 307 mg/dL — ABNORMAL HIGH (ref 0–200)

## 2012-07-07 LAB — TSH: TSH: 9.185 u[IU]/mL — ABNORMAL HIGH (ref 0.350–4.500)

## 2012-07-07 MED ORDER — SIMVASTATIN 40 MG PO TABS: 40.0000 mg | ORAL_TABLET | Freq: Every day | ORAL | Status: DC

## 2012-07-19 ENCOUNTER — Telehealth: Payer: Self-pay | Admitting: *Deleted

## 2012-07-19 ENCOUNTER — Other Ambulatory Visit: Payer: Self-pay | Admitting: Family Medicine

## 2012-07-19 NOTE — Telephone Encounter (Signed)
OK, well lets have her take it consistantly as below and then lets recheck level in 4 weeks.

## 2012-07-19 NOTE — Telephone Encounter (Signed)
LMOM with details

## 2012-07-19 NOTE — Telephone Encounter (Signed)
Pt ask me to inform you that she had not been taking her thyroid med correctly like she should. States there were times she would not take it or take it after eating. We discussed about her taking after brushing her teeth in am since she is always in a hurry out the door. States this is probably why her level is messed up. FYI

## 2012-08-01 ENCOUNTER — Other Ambulatory Visit: Payer: Self-pay | Admitting: *Deleted

## 2012-08-01 MED ORDER — CLONAZEPAM 0.5 MG PO TABS: 0.5000 mg | ORAL_TABLET | Freq: Every day | ORAL | Status: DC | PRN

## 2012-08-02 ENCOUNTER — Other Ambulatory Visit: Payer: Self-pay | Admitting: *Deleted

## 2012-08-02 NOTE — Telephone Encounter (Signed)
LMOM with response. 

## 2012-08-02 NOTE — Telephone Encounter (Signed)
Deny hydrocodone.

## 2012-08-02 NOTE — Telephone Encounter (Signed)
Pt has called requesting refills on clonazepam (which was sent yesterday) and refill for hydrocodone. Please advise if I can refill hydrocodone. It was written for 06/24/12 and was end dated for 07/04/12.

## 2012-08-04 ENCOUNTER — Encounter: Payer: Self-pay | Admitting: Family Medicine

## 2012-08-04 ENCOUNTER — Ambulatory Visit (INDEPENDENT_AMBULATORY_CARE_PROVIDER_SITE_OTHER): Payer: 59 | Admitting: Family Medicine

## 2012-08-04 VITALS — BP 138/89 | HR 55 | Ht 68.0 in | Wt 168.0 lb

## 2012-08-04 DIAGNOSIS — E039 Hypothyroidism, unspecified: Secondary | ICD-10-CM

## 2012-08-04 DIAGNOSIS — G8929 Other chronic pain: Secondary | ICD-10-CM

## 2012-08-04 DIAGNOSIS — M545 Low back pain: Secondary | ICD-10-CM

## 2012-08-04 MED ORDER — HYDROCODONE-ACETAMINOPHEN 10-325 MG PO TABS: 1.0000 | ORAL_TABLET | Freq: Two times a day (BID) | ORAL | Status: DC | PRN

## 2012-08-04 NOTE — Progress Notes (Signed)
  Subjective:    Patient ID: [name], female    DOB: Jun 03, 1956, 56 y.o.   MRN: [id number]  HPI Asked for a refill on her rx pain meds that we denied. Says she has 8 tabs left and is here to discuse her pain. Says her back has been worse this last month bc had quit going to the chiropractor. She just started back once a week.  Says has seen ortho for her back in the past who recommended PT. She has never tried PT.  Says the chiropractor really helps but was cutting back bc of finances.   Hypothyroid - We had called her bc thyrodi level was off. Encouraged her to be more regular about her dosing.  She has been more consistant since then.   Review of Systems     Objective:   Physical Exam  Constitutional: She is oriented to person, place, and time. She appears well-developed and well-nourished.  HENT:  Head: Normocephalic and atraumatic.  Neurological: She is alert and oriented to person, place, and time.  Skin: Skin is warm and dry.  Psychiatric: She has a normal mood and affect. Her behavior is normal.          Assessment & Plan:  Chronic low back pain. - See previous note. Discussed that 40 tabs was supposed to last 3 months.  Will refill early this time with expectation that this should last 2-3 months. We discussed that there needs to be a more long erm plan.  Discussed options of seeing ortho again or possibly PT or wokring with the chiropractor. Discussed can't just rely on narcotics  Discussed dependency issues.  Pt agrees to care plan and it was made very clear to her. She is also starting a new job in 2 weeks so encouraged her to be proactive about her back.   Hypothyoid - Recheck TSH today since doing well with taking meds now.

## 2012-08-05 LAB — TSH: TSH: 2.256 u[IU]/mL (ref 0.350–4.500)

## 2012-08-08 ENCOUNTER — Ambulatory Visit: Payer: Self-pay | Admitting: Family Medicine

## 2012-08-10 ENCOUNTER — Other Ambulatory Visit: Payer: Self-pay | Admitting: *Deleted

## 2012-08-10 MED ORDER — LEVOTHYROXINE SODIUM 75 MCG PO TABS: 75.0000 ug | ORAL_TABLET | Freq: Every day | ORAL | Status: DC

## 2012-08-29 ENCOUNTER — Other Ambulatory Visit: Payer: Self-pay | Admitting: Family Medicine

## 2012-10-10 ENCOUNTER — Ambulatory Visit: Payer: Self-pay | Admitting: Family Medicine

## 2012-10-11 ENCOUNTER — Ambulatory Visit: Payer: Self-pay | Admitting: Family Medicine

## 2012-10-11 DIAGNOSIS — Z0289 Encounter for other administrative examinations: Secondary | ICD-10-CM

## 2012-10-19 ENCOUNTER — Encounter: Payer: Self-pay | Admitting: Family Medicine

## 2012-10-19 ENCOUNTER — Ambulatory Visit (INDEPENDENT_AMBULATORY_CARE_PROVIDER_SITE_OTHER): Payer: 59 | Admitting: Family Medicine

## 2012-10-19 VITALS — BP 125/87 | HR 80 | Wt 170.0 lb

## 2012-10-19 DIAGNOSIS — F411 Generalized anxiety disorder: Secondary | ICD-10-CM

## 2012-10-19 DIAGNOSIS — F4320 Adjustment disorder, unspecified: Secondary | ICD-10-CM

## 2012-10-19 DIAGNOSIS — F4321 Adjustment disorder with depressed mood: Secondary | ICD-10-CM

## 2012-10-19 DIAGNOSIS — F419 Anxiety disorder, unspecified: Secondary | ICD-10-CM

## 2012-10-19 MED ORDER — CLONAZEPAM 0.5 MG PO TABS: ORAL_TABLET | ORAL | Status: DC

## 2012-10-19 MED ORDER — BUSPIRONE HCL 10 MG PO TABS: 10.0000 mg | ORAL_TABLET | Freq: Every day | ORAL | Status: DC

## 2012-10-19 MED ORDER — BUSPIRONE HCL 10 MG PO TABS: 10.0000 mg | ORAL_TABLET | Freq: Three times a day (TID) | ORAL | Status: DC

## 2012-10-19 NOTE — Progress Notes (Addendum)
CC: [name] is a 56 y.o. female is here for Anxiety   Subjective: HPI:  Patient presents for same day visit for anxiety. She describes years of feeling anxiousness and restlessness with a background of depression. Anxiety has drastically deteriorated over the past weeks and has caused her to leave work early due to the severity. She describes a past history of legal and drug problems in her brother to put a great strain on their relationship. She describes grief and worsened anxiety years ago with her father's passing but she feels that this is influencing the way she feels recently. She tells me her mother passed about a year after her father's passing and that she probably had no feelings of grief or sadness following her death. She notes that this may have been because this coincided when her brother was an all time low with his crack and legal troubles. She believes that now that her brother is getting his feet on the ground she's just not dealing with all of grief and longing for her mother.  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 Many times she showed up at her job site only to find malfunctioning vehicles were the complete absence of the vehicles, leaving her unable to perform her job duties. She breaks down crying multiple times during our encounter, especially describing her home life where it sounds like her husband shows very little physical or emotional empathy or interests in [name]'s feelings.  She's currently taking Prozac 20 mg daily, and has been taking Klonopin 0.5mg  tablets split into either a quarter or half 2- 3 times a day. The Klonopin helps it lasts for only 3 hours. She tells me she sleeps great without any medication nor alcohol. She denies thoughts of wanting to harm herself, paranoia, nor hallucinations. She has seen Dr. Christell Constant downstairs before and tells me that she started contacted their  office to see if she can be reestablished.   Review Of Systems Outlined In HPI  Past Medical History  Diagnosis Date  . Hypercholesterolemia   . Arthritis   . Hypothyroidism   . Chronic cystitis   . Decreased libido   . Dyspareunia   . Breast disorder      Rt Breast Calcifications  . Depression   . Anxiety      Family History  Problem Relation Age of Onset  . COPD    . Aneurysm    . Anxiety disorder Mother   . Alcohol abuse Father   . Drug abuse Brother      History  Substance Use Topics  . Smoking status: Never Smoker   . Smokeless tobacco: Never Used  . Alcohol Use: 0.5 - 1.0 oz/week    1-2 drink(s) per week     Objective: Filed Vitals:   10/19/12 1623  BP: 125/87  Pulse: 80    General: Alert and Oriented, No Acute Distress HEENT: Pupils equal, round, . Conjunctivae clear. Moist mucous membranes, Lungs:   Comfortable work of breathing.  Cardiac: Regular rate and rhythm. Extremities: No peripheral edema.  Strong peripheral pulses.  Mental Status: Anxious, tearful, drastically improved after the end of our encounter Skin: Warm and dry.  Assessment & Plan: [name] was seen today for anxiety.  Diagnoses and associated orders for this visit:  Anxiety - Ambulatory referral to Psychiatry - clonazePAM (KLONOPIN) 0.5 MG tablet; Whole to half tablet every 8 hours while starting Buspar. - busPIRone (  BUSPAR) 10 MG tablet; Take 1 tablet (10 mg total) by mouth daily.  Grief reaction - Ambulatory referral to Psychiatry - clonazePAM (KLONOPIN) 0.5 MG tablet; Whole to half tablet every 8 hours while starting Buspar. - busPIRone (BUSPAR) 10 MG tablet; Take 1 tablet (10 mg total) by mouth daily.  30 minutes spent face-to-face counseling the patient and help her find stressors that she can and cannot control in order to help her focus on how to best spent her energy. She goes to hospice grieving counseling every Monday and will continue to do so, she really enjoys this  group. I've sent a referral to Dr. Kathi Der office in hopes that it might help get her reestablished. We discussed the use of Klonopin while titrating BuSpar which will be used as an adjunct to Prozac. She contracted for safety and agreed to return in one week to discuss her progress  Please note that Klonopin was prescribed to her today per se, she has quite a bit left at home she tells me. [place] controlled substance database was reviewed and confirms no suspicious filling patterns. The BuSpar prescription was actually sent and has 3 times a day, I personally contact the pharmacy to ensure that the prescription only read as once a day.  Return in about 1 week (around 10/26/2012).

## 2012-10-20 ENCOUNTER — Ambulatory Visit: Payer: Self-pay | Admitting: Family Medicine

## 2012-10-28 ENCOUNTER — Encounter (HOSPITAL_COMMUNITY): Payer: Self-pay | Admitting: Psychiatry

## 2012-10-28 ENCOUNTER — Ambulatory Visit (INDEPENDENT_AMBULATORY_CARE_PROVIDER_SITE_OTHER): Payer: 59 | Admitting: Psychiatry

## 2012-10-28 VITALS — BP 107/85 | HR 74 | Ht 68.0 in | Wt 169.0 lb

## 2012-10-28 DIAGNOSIS — F332 Major depressive disorder, recurrent severe without psychotic features: Secondary | ICD-10-CM

## 2012-10-28 DIAGNOSIS — F4321 Adjustment disorder with depressed mood: Secondary | ICD-10-CM

## 2012-10-28 DIAGNOSIS — F419 Anxiety disorder, unspecified: Secondary | ICD-10-CM

## 2012-10-28 NOTE — Progress Notes (Signed)
Psychiatric Assessment Adult  Patient Identification:  [name] Date of Evaluation:  10/28/2012 Chief Complaint:   Chief Complaint  Patient presents with  . Anxiety  . Depression   History of Chief Complaint:   Anxiety Presents for initial visit. Onset was more than 5 years ago (Since 2007 when her father passed and her mother  in April 2008.). The problem has been waxing and waning (She reports that anxiety is under control but her depression has become worse.). Symptoms include dizziness (Since starting Buspirone.). Patient reports no nausea. Symptoms occur occasionally. The most recent episode lasted 2 hours. The severity of symptoms is mild. The symptoms are aggravated by family issues (Father and mother died, and brother was a cocaine addict.). The quality of sleep is good. Nighttime awakenings: none.   Risk factors include family history and a major life event. Her past medical history is significant for depression. Past treatments include benzodiazephines, non-benzodiazephine anxiolytics and SSRIs. The treatment provided significant relief. Compliance with prior treatments has been good. Prior compliance problems include insurance issues.   Review of Systems  Constitutional: Negative for fever, chills, diaphoresis, activity change, fatigue and unexpected weight change.  Cardiovascular: Negative.   Gastrointestinal: Positive for diarrhea (last three days.). Negative for nausea, vomiting, abdominal pain, constipation, blood in stool, abdominal distention, anal bleeding and rectal pain.  Neurological: Positive for dizziness (Since starting Buspirone.). Negative for tremors, seizures, syncope, facial asymmetry, speech difficulty, weakness, light-headedness, numbness and headaches.   Filed Vitals:   10/28/12 1403  BP: 107/85  Pulse: 74  Height: 5\' 8"  (1.727 m)  Weight: 169 lb (76.658 kg)   Physical Exam  Vitals reviewed. Constitutional: She appears well-developed and  well-nourished. No distress.  Skin: She is not diaphoretic.    Depressive Symptoms: depressed mood, anhedonia, difficulty concentrating, hypersomnia, loss of energy/fatigue, weight gain, decreased labido, The patient reports that her depression has started getting worse 7 months ago, after her brother's cocaine addiction went into remission. She is unsure why she feels this way. (Hypo) Manic Symptoms:   Elevated Mood:  No Irritable Mood:  Yes Grandiosity:  No Distractibility:  No Labiality of Mood:  No Delusions:  No Hallucinations:  No Impulsivity:  No Sexually Inappropriate Behavior:  No Financial Extravagance:  Yes Flight of Ideas:  No  Psychotic Symptoms:  Hallucinations: No None Delusions:  No Paranoia:  No   Ideas of Reference:  No  PTSD Symptoms: Ever had a traumatic exposure:  Yes Had a traumatic exposure in the last month:  No Re-experiencing: No None Hypervigilance:  No Hyperarousal: No None Avoidance: No None  Traumatic Brain Injury: No   Past Psychiatric History: Diagnosis: MDD,   Hospitalizations: Patient denies  Outpatient Care: Counselors since the age 34, psychiatrists since 2004  Substance Abuse Care: Patient denies, though admits to cocaine and marijuana use in the past  Self-Mutilation: Patient denies, does have OCD "picking" behavior.  Suicidal Attempts: Abient denies  Violent Behaviors: Patient has been verbally aggressive.   Past Medical History:   Past Medical History  Diagnosis Date  . Hypercholesterolemia   . Arthritis     Right Hip  . Hypothyroidism   . Chronic cystitis   . Decreased libido   . Dyspareunia   . Breast disorder      Rt Breast Calcifications  . Depression   . Anxiety   . Plantar fasciitis of left foot     2013   History of Loss of Consciousness:  No Seizure History:  No  Cardiac History:  No Allergies:   Allergies  Allergen Reactions  . Bupropion Hcl     REACTION: irritabillity  . Sulfa Antibiotics  Itching   Current Medications:  Current Outpatient Prescriptions  Medication Sig Dispense Refill  . busPIRone (BUSPAR) 10 MG tablet Take 1 tablet (10 mg total) by mouth daily.  30 tablet  1  . calcium-vitamin D (OSCAL WITH D) 500-200 MG-UNIT per tablet Take 1 tablet by mouth daily.        Marland Kitchen FLUoxetine (PROZAC) 20 MG capsule Take 4 capsules (80 mg total) by mouth daily.  360 capsule  1  . HYDROcodone-acetaminophen (NORCO) 10-325 MG per tablet Take 1 tablet by mouth 2 (two) times daily as needed for pain.  40 tablet  0  . levothyroxine (SYNTHROID, LEVOTHROID) 75 MCG tablet Take 1 tablet (75 mcg total) by mouth daily.  90 tablet  0  . Multiple Vitamin (MULTIVITAMIN) tablet Take 1 tablet by mouth daily.        . nitrofurantoin (MACRODANTIN) 50 MG capsule Take 50 mg by mouth Daily.      . simvastatin (ZOCOR) 40 MG tablet Take 1 tablet (40 mg total) by mouth at bedtime.  90 tablet  0  . clonazePAM (KLONOPIN) 0.5 MG tablet Whole to half tablet every 8 hours while starting Buspar.  1 tablet  0    Previous Psychotropic Medications:  Medication  Prozac  Clonazapam  Buspirone  Alprazolam.   Substance Abuse History in the last 12 months: History  Substance Use Topics  . Smoking status: Former Smoker    Quit date: 09/20/1982  . Smokeless tobacco: Never Used  . Alcohol Use: 0.5 oz/week    1 drink(s) per week  Illicit drugs: Patient admitts to cocaine and marijuana use in the 1980s.  Medical Consequences of Substance Abuse: N/A  Legal Consequences of Substance Abuse: N/A  Family Consequences of Substance Abuse: N/A  Blackouts:  Yes DT's:  No Withdrawal Symptoms:  No None  Social History: Current Place of Residence: [place], [place] Place of Birth: [place], [place] Family Members: The patient has 2 brothers, and 2 sister. The patient Marital Status:  Married-First marriage 22 years. Children: None Relationships: It was her mother, later her brother, now her older sister,  (Ginger.) Education:  [employer] Graduate Educational Problems/Performance: Dyslexia. 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 Military History:  None. Legal History: None Hobbies/Interests:Scrapbook, watching house hunting shows, take pictures  Family History:   Family History  Problem Relation Age of Onset  . COPD    . Aneurysm    . Anxiety disorder Mother   . Depression Mother   . Alcohol abuse Father   . Heart murmur Father   . Drug abuse Brother   . Anxiety disorder Brother   . Depression Brother   . Dementia Maternal Grandfather     Mental Status Examination/Evaluation: Objective:  Appearance: Casual and Well Groomed  Eye Contact::  Good  Speech:  Clear and Coherent and Normal Rate  Volume:  Normal  Mood:  "OKAY"  Affect:  Congruent and Full Range  Thought Process:  Coherent, Logical and Loose  Orientation:  Full  Thought Content:  WDL  Suicidal Thoughts:  No  Homicidal Thoughts:  No  Judgement:  Fair  Insight:  Fair  Psychomotor Activity:  Normal  Akathisia:  No  Handed:  Left  Memory: Immediate 3/3, recent: 2/3  Assets:  Communication Skills Desire for Improvement Financial Resources/Insurance Housing  Leisure Time Physical Health Resilience Transportation Vocational/Educational    Laboratory/X-Ray Psychological Evaluation(s)   NONE  NONE   Assessment:   AXIS I Major Depression, Recurrent severe  AXIS II No diagnosis  AXIS III Past Medical History  Diagnosis Date  . Hypercholesterolemia   . Arthritis     Right Hip  . Hypothyroidism   . Chronic cystitis   . Decreased libido   . Dyspareunia   . Breast disorder      Rt Breast Calcifications  . Depression   . Anxiety   . Plantar fasciitis of left foot     2013     AXIS IV other psychosocial or environmental problems  AXIS V 51-60 moderate symptoms   Treatment Plan/Recommendations:  PLAN:  1. Affirm with the patient  that the medications are taken as ordered. Patient expressed understanding of how their medications were to be used.  2. Continue the following psychiatric medications as written prior to this appointment with the following changes:  a) Prozac 80 mg. b) Will increase buspirone to 10 mg BID. c)Will not start clonazepam, I have had a lengthy discussion with this patient regarding the dangers of physiologic and psychological dependence. Given family history and previous substance abuse history, I have discussed alternative options for panic attacks. 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 3. Therapy: brief supportive therapy provided. Continue current services.  4. Risks and benefits, side effects and alternatives discussed with patient, she was given an opportunity to ask questions about her medication, illness, and treatment. All current psychiatric medications have been reviewed and discussed with the patient and adjusted as clinically appropriate. The patient has been provided an accurate and updated list of the medications being now prescribed.  5. Patient told to call clinic if any problems occur. Patient advised to go to ER  if she should develop SI/HI, side effects, or if symptoms worsen. Has crisis numbers to call if needed.   6. No labs warranted at this time.  7. The patient was encouraged to keep all PCP and specialty clinic appointments.  8. Patient was instructed to return to clinic in 1 month.  9. The patient was advised to call and cancel their mental health appointment within 24 hours of the appointment, if they are unable to keep the appointment, as well as the three no show and termination from clinic policy. 10. The patient expressed understanding of the plan and agrees with the above.   Jacqulyn Cane, MD 11/8/20132:19 PM

## 2012-10-31 LAB — DRUG SCREEN, URINE
Benzodiazepines.: NEGATIVE
Creatinine,U: 24.37 mg/dL
Methadone: NEGATIVE
Phencyclidine (PCP): NEGATIVE
Propoxyphene: NEGATIVE

## 2012-11-01 ENCOUNTER — Other Ambulatory Visit (HOSPITAL_COMMUNITY): Payer: Self-pay | Admitting: Psychiatry

## 2012-11-08 MED ORDER — BUSPIRONE HCL 10 MG PO TABS: 10.0000 mg | ORAL_TABLET | Freq: Three times a day (TID) | ORAL | Status: DC

## 2012-11-10 ENCOUNTER — Telehealth (HOSPITAL_COMMUNITY): Payer: Self-pay

## 2012-11-10 DIAGNOSIS — F4321 Adjustment disorder with depressed mood: Secondary | ICD-10-CM

## 2012-11-10 DIAGNOSIS — F419 Anxiety disorder, unspecified: Secondary | ICD-10-CM

## 2012-11-10 MED ORDER — BUSPIRONE HCL 10 MG PO TABS: 10.0000 mg | ORAL_TABLET | Freq: Two times a day (BID) | ORAL | Status: DC | PRN

## 2012-11-10 NOTE — Telephone Encounter (Signed)
Patient called. Will refill medications.

## 2012-11-10 NOTE — Telephone Encounter (Signed)
Pt would like a refill of her buspar she is taking 2 a day now.

## 2012-11-22 ENCOUNTER — Other Ambulatory Visit: Payer: Self-pay | Admitting: Family Medicine

## 2012-11-25 ENCOUNTER — Ambulatory Visit (INDEPENDENT_AMBULATORY_CARE_PROVIDER_SITE_OTHER): Payer: 59 | Admitting: Psychiatry

## 2012-11-25 ENCOUNTER — Encounter (HOSPITAL_COMMUNITY): Payer: Self-pay | Admitting: Psychiatry

## 2012-11-25 VITALS — BP 115/73 | HR 74 | Ht 68.0 in | Wt 168.0 lb

## 2012-11-25 DIAGNOSIS — F4321 Adjustment disorder with depressed mood: Secondary | ICD-10-CM

## 2012-11-25 DIAGNOSIS — F332 Major depressive disorder, recurrent severe without psychotic features: Secondary | ICD-10-CM

## 2012-11-25 DIAGNOSIS — F419 Anxiety disorder, unspecified: Secondary | ICD-10-CM

## 2012-11-25 MED ORDER — FLUOXETINE HCL 40 MG PO CAPS: 80.0000 mg | ORAL_CAPSULE | Freq: Every day | ORAL | Status: DC

## 2012-11-25 MED ORDER — BUSPIRONE HCL 10 MG PO TABS: 10.0000 mg | ORAL_TABLET | Freq: Two times a day (BID) | ORAL | Status: DC | PRN

## 2012-11-25 MED ORDER — BUSPIRONE HCL 10 MG PO TABS: 10.0000 mg | ORAL_TABLET | Freq: Three times a day (TID) | ORAL | Status: DC

## 2012-11-25 NOTE — Progress Notes (Signed)
[facility] Follow-up Outpatient Visit  [name] [birth year]  Date: 11/25/2012  Chief Complaint:  Follow Up.  History of Chief Complaint:  Ms. Herro is a 56 y/o female with a past psychiatric history significant for Major Depression, Recurrent severe. The patient is referred for psychiatric services for medication management.   The patient reports that she has some anxiety regarding a new responsibility at her job which started yesterday. The patient reports taking a quarter of a one mg tablet of clonazepam, which she took this morning. She reports less dizziness since she has decreased her dose of clonazepam to 0.25 mg PRN.  In the area of affective symptoms, patient appears euthymic. Patient denies current suicidal ideation, intent, or plan. Patient denies current homicidal ideation, intent, or plan. Patient denies auditory hallucinations. Patient denies visual hallucinations. Patient denies symptoms of paranoia. Patient states sleep is good, with approximately 8-9 hours of sleep per night, with Diphendramine (ZZyquil).  Appetite is good. Energy level is slightly improved. Patient denies symptoms of anhedonia. Patient denies hopelessness, helplessness, or guilt. She reports   Denies any recent episodes consistent with mania, particularly decreased need for sleep with increased energy, grandiosity, impulsivity, hyperverbal and pressured speech, or increased productivity. Denies any recent symptoms consistent with psychosis, particularly auditory or visual hallucinations, thought broadcasting/insertion/withdrawal, or ideas of reference. Also denies excessive worry to the point of physical symptoms as well as any panic attacks. Denies any history of trauma or symptoms consistent with PTSD such as flashbacks, nightmares, hypervigilance, feelings of numbness or inability to connect with others.   Review of Systems  Constitutional: Negative for fever, chills, diaphoresis, activity  change, fatigue and unexpected weight change.  Cardiovascular: Negative.  Gastrointestinal:  Negative for nausea, vomiting, abdominal pain, diarrhea, constipation, blood in stool, abdominal distention, anal bleeding and rectal pain.  Neurological:Negative for dizziness, tremors, seizures, syncope, facial asymmetry, speech difficulty, weakness, light-headedness, numbness and headaches.   Filed Vitals:   11/25/12 1106  BP: 115/73  Pulse: 74  Height: 5\' 8"  (1.727 m)  Weight: 168 lb (76.204 kg)   Physical Exam  Vitals reviewed.  Constitutional: She appears well-developed and well-nourished. No distress.  Skin: She is not diaphoretic.   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   Suicidal Attempts: Abient denies   Violent Behaviors: Patient has been verbally aggressive.    Past Medical History: Reviewed Past Medical History  Diagnosis Date  . Hypercholesterolemia   . Arthritis     Right Hip  . Hypothyroidism   . Chronic cystitis   . Decreased libido   . Dyspareunia   . Breast disorder      Rt Breast Calcifications  . Depression   . Anxiety   . Plantar fasciitis of left foot     2013   History of Loss of Consciousness: No  Seizure History: No  Cardiac History: No   Allergies: Reviewed Allergies  Allergen Reactions  . Bupropion Hcl     REACTION: irritabillity  . Sulfa Antibiotics Itching   Current Medications: Reviewed Current Outpatient Prescriptions on File Prior to Visit  Medication Sig Dispense Refill  . busPIRone (BUSPAR) 10 MG tablet Take 1 tablet (10 mg total) by mouth 2 (two) times daily as needed.  60  tablet  1  . calcium-vitamin D (OSCAL WITH D) 500-200 MG-UNIT per tablet Take 1  tablet by mouth daily.        . clonazePAM (KLONOPIN) 0.5 MG tablet Whole to half tablet every 8 hours while starting Buspar.  1 tablet  0  . FLUoxetine (PROZAC) 20 MG capsule Take 4 capsules (80 mg total) by mouth daily.  360 capsule  1  . HYDROcodone-acetaminophen (NORCO) 10-325 MG per tablet Take 1 tablet by mouth 2 (two) times daily as needed for pain.  40 tablet  0  . levothyroxine (SYNTHROID, LEVOTHROID) 75 MCG tablet Take 1 tablet (75 mcg total) by mouth daily.  90 tablet  0  . Multiple Vitamin (MULTIVITAMIN) tablet Take 1 tablet by mouth daily.        . nitrofurantoin (MACRODANTIN) 50 MG capsule Take 50 mg by mouth Daily.      . simvastatin (ZOCOR) 40 MG tablet TAKE 1 TABLET (40 MG TOTAL) BY MOUTH AT BEDTIME.  90 tablet  0   Previous Psychotropic Medications: Reviewed Medication   Prozac   Clonazapam   Buspirone   Alprazolam.    Substance Abuse History in the last 12 months: Reviewed History   Substance Use Topics   .  Smoking status:  Former Smoker     Quit date:  09/20/1982   .  Smokeless tobacco:  Never Used   .  Alcohol Use:  0.5 oz/week     1 drink(s) per week   Illicit drugs: Patient admitts to cocaine and marijuana use in the 1980s.  Caffienated beverage: 3 cups.  Medical Consequences of Substance Abuse: N/A  Legal Consequences of Substance Abuse: N/A  Family Consequences of Substance Abuse: N/A  Blackouts: Yes  DT's: No  Withdrawal Symptoms: No None   Social History: Reviewed Current Place of Residence: [place], [place]  Place of Birth: [place], [place]  Family Members: The patient has 2 brothers, and 2 sister. The patient  Marital Status: Married-First marriage 22 years.  Children: None  Relationships: It was her mother, later her brother, now her older sister, (Ginger.)  Education: HS Graduate  Educational Problems/Performance: Dyslexia.  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  Military  History: None.  Legal History: None  Hobbies/Interests:Scrapbook, watching house hunting shows, take pictures   Family History: Reviewed Family History   Problem  Relation  Age of Onset   .  COPD     .  Aneurysm     .  Anxiety disorder  Mother    .  Depression  Mother    .  Alcohol abuse  Father    .  Heart murmur  Father    .  Drug abuse  Brother    .  Anxiety disorder  Brother    .  Depression  Brother    .  Dementia  Maternal Grandfather     Mental Status Examination/Evaluation:  Objective: Appearance: Casual and Well Groomed   Eye Contact:: Good   Speech: Clear and Coherent and Normal Rate   Volume: Normal   Mood: "OKAY" 4/10  Affect: Congruent and Full Range   Thought Process: Coherent, Logical and Loose   Orientation: Full   Thought Content: WDL   Suicidal Thoughts: No   Homicidal Thoughts: No   Judgement: Fair   Insight: Fair   Psychomotor Activity: Normal   Akathisia: No   Handed: Left   Memory: Immediate 3/3, recent: 3/3  Assets: Communication Skills  Desire for Improvement  Financial Resources/Insurance  Housing  Leisure Time  Physical Health  Resilience  Transportation  Vocational/Educational    Laboratory/X-Ray  Psychological Evaluation(s)   NONE  NONE    Assessment:  AXIS I  Major Depression, Recurrent severe   AXIS II  No diagnosis   AXIS III  Past Medical History    Diagnosis  Date    .  Hypercholesterolemia     .  Arthritis       Right Hip    .  Hypothyroidism     .  Chronic cystitis     .  Decreased libido     .  Dyspareunia     .  Breast disorder       Rt Breast Calcifications    .  Depression     .  Anxiety     .  Plantar fasciitis of left foot       2013      AXIS IV  other psychosocial or environmental problems   AXIS V  51-60 moderate symptoms    Treatment Plan/Recommendations:  PLAN:  1. Affirm with the patient that the medications are taken as ordered. Patient expressed understanding of how their medications were to be  used.  2. Continue the following psychiatric medications as written prior to this appointment with the following changes:  a) Prozac 80 mg.  b) Will increase buspirone to 10 mg TID C)Patient is using clonazepam PRN-0.25 mg PRN as needed. 3. Therapy: brief supportive therapy provided. Continue current services.  4. Risks and benefits, side effects and alternatives discussed with patient, she was given an opportunity to ask questions about her medication, illness, and treatment. All current psychiatric medications have been reviewed and discussed with the patient and adjusted as clinically appropriate. The patient has been provided an accurate and updated list of the medications being now prescribed.  5. Patient told to call clinic if any problems occur. Patient advised to go to ER if she should develop SI/HI, side effects, or if symptoms worsen. Has crisis numbers to call if needed.  6. No labs warranted at this time.  7. The patient was encouraged to keep all PCP and specialty clinic appointments.  8. Patient was instructed to return to clinic in 1 month.  9. The patient was advised to call and cancel their mental health appointment within 24 hours of the appointment, if they are unable to keep the appointment, as well as the three no show and termination from clinic policy.  10. The patient expressed understanding of the plan and agrees with the above.  Jacqulyn Cane, MD

## 2012-12-05 ENCOUNTER — Other Ambulatory Visit: Payer: Self-pay | Admitting: Family Medicine

## 2012-12-26 ENCOUNTER — Encounter (HOSPITAL_COMMUNITY): Payer: Self-pay | Admitting: Psychiatry

## 2012-12-26 ENCOUNTER — Ambulatory Visit (INDEPENDENT_AMBULATORY_CARE_PROVIDER_SITE_OTHER): Payer: 59 | Admitting: Psychiatry

## 2012-12-26 ENCOUNTER — Telehealth (HOSPITAL_COMMUNITY): Payer: Self-pay | Admitting: Psychiatry

## 2012-12-26 VITALS — BP 133/85 | HR 78 | Ht 68.0 in | Wt 170.0 lb

## 2012-12-26 DIAGNOSIS — F419 Anxiety disorder, unspecified: Secondary | ICD-10-CM

## 2012-12-26 DIAGNOSIS — F4321 Adjustment disorder with depressed mood: Secondary | ICD-10-CM

## 2012-12-26 DIAGNOSIS — F332 Major depressive disorder, recurrent severe without psychotic features: Secondary | ICD-10-CM

## 2012-12-26 MED ORDER — BUSPIRONE HCL 15 MG PO TABS: 15.0000 mg | ORAL_TABLET | Freq: Three times a day (TID) | ORAL | Status: DC

## 2012-12-26 MED ORDER — FLUOXETINE HCL 40 MG PO CAPS: 80.0000 mg | ORAL_CAPSULE | Freq: Every day | ORAL | Status: DC

## 2012-12-26 NOTE — Progress Notes (Signed)
[facility] Follow-up Outpatient Visit  [name] 09-10-1956  Date: 12/26/2012  Chief Complaint:  Follow Up.  History of Chief Complaint:  [name] is a 57 y/o female with a past psychiatric history significant for Major Depression, Recurrent severe. The patient is referred for psychiatric services for medication management.   The patient reports that she quit her job yesterday as she has not been paid in 3 weeks.  She is now looking for a new jobs as other employees have not been paid in 6 weeks. She states that she has taken clonazepam 0.25 mg. She reports quitting her job has allow her some decrease in her stress levels. She states that she take all 30 mg of buspirone in the morning which has also improved her anxiety. She reports she is taking her medications and denies any side effects.   In the area of affective symptoms, patient appears euthymic. Patient denies current suicidal ideation, intent, or plan. Patient denies current homicidal ideation, intent, or plan. Patient denies auditory hallucinations. Patient denies visual hallucinations. Patient denies symptoms of paranoia. Patient states sleep is good, with approximately 8-9 hours of sleep per night, she is not using diphenhydramine for sleep. appetite is good. Energy level is fair. Patient denies symptoms of anhedonia. Patient denies hopelessness, helplessness, or guilt.   Denies any recent episodes consistent with mania, particularly decreased need for sleep with increased energy, grandiosity, impulsivity, hyperverbal and pressured speech, or increased productivity. Denies any recent symptoms consistent with psychosis, particularly auditory or visual hallucinations, thought broadcasting/insertion/withdrawal, or ideas of reference. Also denies excessive worry to the point of physical symptoms as well as any panic attacks. Denies any history of trauma or symptoms consistent with PTSD such as flashbacks, nightmares,  hypervigilance, feelings of numbness or inability to connect with others.   Review of Systems  Constitutional: Negative for fever, chills, diaphoresis, activity change, fatigue and unexpected weight change.  Cardiovascular: Negative.  Gastrointestinal:  Negative for nausea, vomiting, abdominal pain, diarrhea, constipation, blood in stool, abdominal distention, anal bleeding and rectal pain.  Neurological:Negative for dizziness, tremors, seizures, syncope, facial asymmetry, speech difficulty, weakness, light-headedness, numbness and headaches.   Filed Vitals:   12/26/12 1106  BP: 133/85  Pulse: 78  Height: 5\' 8"  (1.727 m)  Weight: 170 lb (77.111 kg)   Physical Exam  Vitals reviewed.  Constitutional: She appears well-developed and well-nourished. No distress.  Skin: She is not diaphoretic.   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   Suicidal Attempts: Abient denies   Violent Behaviors: Patient has been verbally aggressive.    Past Medical History: Reviewed Past Medical History  Diagnosis Date  . Hypercholesterolemia   . Arthritis     Right Hip  . Hypothyroidism   . Chronic cystitis   . Decreased libido   . Dyspareunia   . Breast disorder      Rt Breast Calcifications  . Depression   . Anxiety   . Plantar fasciitis of left foot     2013   History of Loss of Consciousness: No  Seizure History: No  Cardiac History: No   Allergies: Reviewed Allergies  Allergen Reactions  . Bupropion Hcl     REACTION: irritabillity  . Sulfa Antibiotics Itching  Current Medications: Reviewed Current Outpatient Prescriptions on File Prior to Visit  Medication Sig Dispense Refill  . busPIRone (BUSPAR)  10 MG tablet Take 1 tablet (10 mg total) by mouth 3 (three) times daily.  60 tablet  1  . calcium-vitamin D (OSCAL WITH D) 500-200 MG-UNIT per tablet Take 1 tablet by mouth daily.        . clonazePAM (KLONOPIN) 0.5 MG tablet Whole to half tablet every 8 hours while starting Buspar.  1 tablet  0  . FLUoxetine (PROZAC) 40 MG capsule Take 2 capsules (80 mg total) by mouth daily.  180 capsule  1  . levothyroxine (SYNTHROID, LEVOTHROID) 75 MCG tablet TAKE 1 TABLET (75 MCG TOTAL) BY MOUTH DAILY.  90 tablet  0  . Multiple Vitamin (MULTIVITAMIN) tablet Take 1 tablet by mouth daily.        . nitrofurantoin (MACRODANTIN) 50 MG capsule Take 50 mg by mouth Daily.      . simvastatin (ZOCOR) 40 MG tablet TAKE 1 TABLET (40 MG TOTAL) BY MOUTH AT BEDTIME.  90 tablet  0   Previous Psychotropic Medications: Reviewed Medication   Prozac   Clonazapam   Buspirone   Alprazolam.    Substance Abuse History in the last 12 months: Reviewed History  Substance Use Topics  . Smoking status: Former Smoker    Quit date: 09/20/1982  . Smokeless tobacco: Never Used  . Alcohol Use: 0.0 oz/week    0.5 drink(s) per week     Comment: Use is 1-2 a month.   Illicit drugs: Patient admitts to cocaine and marijuana use in the 1980s.  Caffienated beverage: 4 cups.  Medical Consequences of Substance Abuse: N/A  Legal Consequences of Substance Abuse: N/A  Family Consequences of Substance Abuse: N/A  Blackouts: Yes  DT's: No  Withdrawal Symptoms: No None   Social History: Reviewed Current Place of Residence: [place], [place]  Place of Birth: [place], [place]  Family Members: The patient has 2 brothers, and 2 sister. The patient  Marital Status: Married-First marriage 22 years.  Children: None  Relationships: It was her mother, later her brother, now her older sister, (Ginger.)  Education: HS Graduate  Educational Problems/Performance: Dyslexia.  Religious Beliefs/Practices: None  History of Abuse: none  Occupational  Experiences: Currently unemployed Military History: None.  Legal History: None  Hobbies/Interests:Scrapbook, watching house hunting shows, take pictures   Family History: Reviewed Family History   Problem  Relation  Age of Onset   .  COPD     .  Aneurysm     .  Anxiety disorder  Mother    .  Depression  Mother    .  Alcohol abuse  Father    .  Heart murmur  Father    .  Drug abuse  Brother    .  Anxiety disorder  Brother    .  Depression  Brother    .  Dementia  Maternal Grandfather     Mental Status Examination/Evaluation:  Objective: Appearance: Casual and Well Groomed   Eye Contact:: Good   Speech: Clear and Coherent and Normal Rate   Volume: Normal   Mood: "Good" 5-6/10  Affect: Congruent and Full Range   Thought Process: Coherent, Logical and Loose   Orientation: Full   Thought Content: WDL   Suicidal Thoughts: No   Homicidal Thoughts: No   Judgement: Fair   Insight: Fair   Psychomotor Activity: Normal   Akathisia: No   Handed: Left   Memory: Immediate  3/3, recent: 3/3   Assets: Manufacturing systems engineer  Desire for Improvement  Financial Resources/Insurance  Housing  Leisure Time  Physical Health  Resilience  Transportation  Vocational/Educational    Laboratory/X-Ray  Psychological Evaluation(s)   NONE  NONE    Assessment:  AXIS I  Major Depression, Recurrent severe   AXIS II  No diagnosis   AXIS III  Past Medical History    Diagnosis  Date    .  Hypercholesterolemia     .  Arthritis       Right Hip    .  Hypothyroidism     .  Chronic cystitis     .  Decreased libido     .  Dyspareunia     .  Breast disorder       Rt Breast Calcifications    .  Depression     .  Anxiety     .  Plantar fasciitis of left foot       2013      AXIS IV  other psychosocial or environmental problems   AXIS V  51-60 moderate symptoms    Treatment Plan/Recommendations:  PLAN:  1. Affirm with the patient that the medications are taken as ordered. Patient expressed  understanding of how their medications were to be used.  2. Continue the following psychiatric medications as written prior to this appointment with the following changes:  a) Prozac 80 mg.  b) Will increase buspirone to 15 mg TID C)Patient is using clonazepam PRN-0.25 mg PRN as needed. Will not prescribe this medication secondary to previous history of substance abuse. 3. Therapy: brief supportive therapy provided. Continue current services. Discussed psychosocial stressors.  4. Risks and benefits, side effects and alternatives discussed with patient, she was given an opportunity to ask questions about her medication, illness, and treatment. All current psychiatric medications have been reviewed and discussed with the patient and adjusted as clinically appropriate. The patient has been provided an accurate and updated list of the medications being now prescribed.  5. Patient told to call clinic if any problems occur. Patient advised to go to ER if she should develop SI/HI, side effects, or if symptoms worsen. Has crisis numbers to call if needed.  6. No labs warranted at this time.  7. The patient was encouraged to keep all PCP and specialty clinic appointments.  8. Patient was instructed to return to clinic in 1 month.  9. The patient was advised to call and cancel their mental health appointment within 24 hours of the appointment, if they are unable to keep the appointment, as well as the three no show and termination from clinic policy.  10. The patient expressed understanding of the plan and agrees with the above.  Jacqulyn Cane, MD

## 2012-12-26 NOTE — Telephone Encounter (Signed)
Erroneous Encounter

## 2013-01-23 ENCOUNTER — Ambulatory Visit (HOSPITAL_COMMUNITY): Payer: Self-pay | Admitting: Psychiatry

## 2013-01-24 ENCOUNTER — Ambulatory Visit (INDEPENDENT_AMBULATORY_CARE_PROVIDER_SITE_OTHER): Payer: 59 | Admitting: Psychiatry

## 2013-01-24 ENCOUNTER — Encounter (HOSPITAL_COMMUNITY): Payer: Self-pay | Admitting: Psychiatry

## 2013-01-24 VITALS — BP 118/80 | HR 69 | Ht 68.0 in | Wt 174.0 lb

## 2013-01-24 DIAGNOSIS — F419 Anxiety disorder, unspecified: Secondary | ICD-10-CM

## 2013-01-24 DIAGNOSIS — F332 Major depressive disorder, recurrent severe without psychotic features: Secondary | ICD-10-CM

## 2013-01-24 DIAGNOSIS — F4321 Adjustment disorder with depressed mood: Secondary | ICD-10-CM

## 2013-01-24 MED ORDER — BUSPIRONE HCL 15 MG PO TABS: 15.0000 mg | ORAL_TABLET | Freq: Three times a day (TID) | ORAL | Status: DC

## 2013-01-24 NOTE — Progress Notes (Signed)
[facility] Follow-up Outpatient Visit  [name] 12-08-1956  Date: 01/24/2013   Chief Complaint:  Follow Up.  History of Chief Complaint:  [name] is a 57 y/o female with a past psychiatric history significant for Major Depression, Recurrent severe. The patient is referred for psychiatric services for medication management.   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 She has an orientation. She reports she will have regular hours and be paid on a regular basis.   In the area of affective symptoms, patient appears euthymic. Patient denies current suicidal ideation, intent, or plan. Patient denies current homicidal ideation, intent, or plan. Patient denies auditory hallucinations. Patient denies visual hallucinations. Patient denies symptoms of paranoia. Patient states sleep is good, with approximately  8-9 hours of sleep per night, she is not using diphenhydramine for sleep. She reports appetite is good. Energy level is fair. Patient denies symptoms of anhedonia. Patient denies hopelessness, helplessness, or guilt.   Denies any recent episodes consistent with mania, particularly decreased need for sleep with increased energy, grandiosity, impulsivity, hyperverbal and pressured speech, or increased productivity. Denies any recent symptoms consistent with psychosis, particularly auditory or visual hallucinations, thought broadcasting/insertion/withdrawal, or ideas of reference. Also denies excessive worry to the point of physical symptoms as well as any panic attacks. Denies any history of trauma or symptoms consistent with PTSD such as flashbacks, nightmares, hypervigilance, feelings of numbness or inability to connect with others.   Review of Systems  Constitutional: Negative for fever, chills, diaphoresis, activity change, fatigue and unexpected weight change.  Cardiovascular: Negative.  Gastrointestinal:  Negative for nausea,  vomiting, abdominal pain, diarrhea, constipation, blood in stool, abdominal distention, anal bleeding and rectal pain.  Neurological:Negative for dizziness, tremors, seizures, syncope, facial asymmetry, speech difficulty, weakness, light-headedness, numbness and headaches.   Filed Vitals:   01/24/13 1628  BP: 118/80  Pulse: 69  Height: 5\' 8"  (1.727 m)  Weight: 174 lb (78.926 kg)   Physical Exam  Vitals reviewed.  Constitutional: She appears well-developed and well-nourished. No distress.  Skin: She is not diaphoretic.   Past Psychiatric History: Reviewed Diagnosis: MDD,   Hospitalizations: Patient denies   Outpatient Care: Counselors since the age 25, psychiatrists since 2004   Substance Abuse Care: Patient denies, though admits to cocaine and marijuana use in the past   Self-Mutilation: Patient denies, does have OCD "picking" behavior.   Suicidal Attempts: Abient denies   Violent Behaviors: Patient has been verbally aggressive.    Past Medical History: Reviewed Past Medical History  Diagnosis Date  . Hypercholesterolemia   . Arthritis     Right Hip  . Hypothyroidism   . Chronic cystitis   . Decreased libido   . Dyspareunia   . Breast disorder      Rt Breast Calcifications  . Depression   . Anxiety   . Plantar fasciitis of left foot     2013   History of Loss of Consciousness: No  Seizure History: No  Cardiac History: No   Allergies: Reviewed Allergies  Allergen Reactions  . Bupropion Hcl     REACTION: irritabillity  . Sulfa Antibiotics Itching   Current Medications: Reviewed Current Outpatient Prescriptions on File Prior to Visit  Medication Sig Dispense Refill  . busPIRone (BUSPAR) 15 MG tablet Take 1 tablet (15 mg total) by mouth 3 (three) times daily.  90 tablet  1  . calcium-vitamin D (OSCAL WITH D) 500-200 MG-UNIT per tablet Take 1  tablet by mouth daily.        Marland Kitchen FLUoxetine (PROZAC) 40 MG capsule Take 2 capsules (80 mg total) by mouth daily.  180  capsule  1  . levothyroxine (SYNTHROID, LEVOTHROID) 75 MCG tablet TAKE 1 TABLET (75 MCG TOTAL) BY MOUTH DAILY.  90 tablet  0  . Multiple Vitamin (MULTIVITAMIN) tablet Take 1 tablet by mouth daily.        . nitrofurantoin (MACRODANTIN) 50 MG capsule Take 50 mg by mouth Daily.      . simvastatin (ZOCOR) 40 MG tablet TAKE 1 TABLET (40 MG TOTAL) BY MOUTH AT BEDTIME.  90 tablet  0   Previous Psychotropic Medications: Reviewed Medication   Prozac   Clonazapam   Buspirone   Alprazolam.    Substance Abuse History in the last 12 months: Reviewed History  Substance Use Topics  . Smoking status: Former Smoker    Quit date: 09/20/1982  . Smokeless tobacco: Never Used  . Alcohol Use: 0.0 oz/week    0.5 drink(s) per week     Comment: Use is 1-2 a month.   Illicit drugs: Patient admitts to cocaine and marijuana use in the 1980s.  Caffienated beverage: 4 cups.  Medical Consequences of Substance Abuse: N/A  Legal Consequences of Substance Abuse: N/A  Family Consequences of Substance Abuse: N/A  Blackouts: Yes  DT's: No  Withdrawal Symptoms: No None   Social History: Reviewed Current Place of Residence: [place], [place]  Place of Birth: [place], [place]  Family Members: The patient has 2 brothers, and 2 sister. The patient  Marital Status: Married-First marriage 22 years.  Children: None  Relationships: It was her mother, later her brother, now her older sister, (Ginger.)  Education: HS Graduate  Educational Problems/Performance: Dyslexia.  Religious Beliefs/Practices: None  History of Abuse: none  Occupational Experiences: Currently unemployed Military History: None.  Legal History: None  Hobbies/Interests:Scrapbook, watching house hunting shows, take pictures   Family History: Reviewed Family History   Problem  Relation  Age of Onset   .  COPD     .  Aneurysm     .  Anxiety disorder  Mother    .  Depression  Mother    .  Alcohol abuse  Father    .  Heart murmur  Father    .  Drug  abuse  Brother    .  Anxiety disorder  Brother    .  Depression  Brother    .  Dementia  Maternal Grandfather     Mental Status Examination/Evaluation:  Objective: Appearance: Casual and Well Groomed   Eye Contact:: Good   Speech: Clear and Coherent and Normal Rate   Volume: Normal   Mood: "Good" 5-6/10  Affect: Congruent and Full Range   Thought Process: Coherent, Logical and Loose   Orientation: Full   Thought Content: WDL   Suicidal Thoughts: No   Homicidal Thoughts: No   Judgement: Fair   Insight: Fair   Psychomotor Activity: Normal   Akathisia: No   Handed: Left   Memory: Immediate 3/3, recent: 3/3   Assets: Communication Skills  Desire for Improvement  Financial Resources/Insurance  Housing  Leisure Time  Physical Health  Resilience  Transportation  Vocational/Educational    Laboratory/X-Ray  Psychological Evaluation(s)   NONE  NONE    Assessment:  AXIS I  Major Depression, Recurrent severe   AXIS II  No diagnosis   AXIS III  Past Medical History    Diagnosis  Date    .  Hypercholesterolemia     .  Arthritis       Right Hip    .  Hypothyroidism     .  Chronic cystitis     .  Decreased libido     .  Dyspareunia     .  Breast disorder       Rt Breast Calcifications    .  Depression     .  Anxiety     .  Plantar fasciitis of left foot       2013      AXIS IV  other psychosocial or environmental problems   AXIS V  51-60 moderate symptoms    Treatment Plan/Recommendations:  PLAN:  1. Affirm with the patient that the medications are taken as ordered. Patient expressed understanding of how their medications were to be used.  2. Continue the following psychiatric medications as written prior to this appointment with the following changes:  a) Prozac 80 mg.  b) Will continue buspirone to 15 mg TID C)Discontinue clonazepam. 3. Therapy: brief supportive therapy provided. Continue current services. Discussed psychosocial stressors.  4. Risks and benefits,  side effects and alternatives discussed with patient, she was given an opportunity to ask questions about her medication, illness, and treatment. All current psychiatric medications have been reviewed and discussed with the patient and adjusted as clinically appropriate. The patient has been provided an accurate and updated list of the medications being now prescribed.  5. Patient told to call clinic if any problems occur. Patient advised to go to ER if she should develop SI/HI, side effects, or if symptoms worsen. Has crisis numbers to call if needed.  6. No labs warranted at this time.  7. The patient was encouraged to keep all PCP and specialty clinic appointments.  8. Patient was instructed to return to clinic in 1 month.  9. The patient was advised to call and cancel their mental health appointment within 24 hours of the appointment, if they are unable to keep the appointment, as well as the three no show and termination from clinic policy.  10. The patient expressed understanding of the plan and agrees with the above.  Jacqulyn Cane, MD

## 2013-02-06 ENCOUNTER — Ambulatory Visit (INDEPENDENT_AMBULATORY_CARE_PROVIDER_SITE_OTHER): Payer: 59 | Admitting: Family Medicine

## 2013-02-06 ENCOUNTER — Encounter: Payer: Self-pay | Admitting: Family Medicine

## 2013-02-06 VITALS — BP 104/71 | HR 63 | Ht 68.0 in | Wt 169.0 lb

## 2013-02-06 DIAGNOSIS — R140 Abdominal distension (gaseous): Secondary | ICD-10-CM

## 2013-02-06 DIAGNOSIS — R141 Gas pain: Secondary | ICD-10-CM

## 2013-02-06 DIAGNOSIS — R32 Unspecified urinary incontinence: Secondary | ICD-10-CM

## 2013-02-06 DIAGNOSIS — E039 Hypothyroidism, unspecified: Secondary | ICD-10-CM

## 2013-02-06 DIAGNOSIS — K1370 Unspecified lesions of oral mucosa: Secondary | ICD-10-CM

## 2013-02-06 DIAGNOSIS — N390 Urinary tract infection, site not specified: Secondary | ICD-10-CM

## 2013-02-06 LAB — POCT URINALYSIS DIPSTICK
Leukocytes, UA: NEGATIVE
Nitrite, UA: NEGATIVE
Protein, UA: NEGATIVE
pH, UA: 6

## 2013-02-06 NOTE — Patient Instructions (Addendum)
Work on Metallurgist, drinking more water and consider trying a probiotic, like Librarian, academic.

## 2013-02-06 NOTE — Progress Notes (Signed)
Subjective:    Patient ID: [name], female    DOB: 1956-04-06, 57 y.o.   MRN: [id number]  HPI Says after urinates she will wipe and then stand up and then will leak.  Says not happening with sneezing or coughing.  No urgency.  No hematuria.  No dysuria. + former smoker. Hx of recurrent  Infection. Occasionally has difficulty initiating urination.  Growth in the mouth.  Has been slwoly getting bigger. Getting harder to swallow pills.  Has been there for years.  She says her dentist about 10 years ago had recommended that she have a biopsy. That her current medicines never said anything about it. It is not painful. She says when she eats crunchy things like potato chips it does get irritated and stay sore for a couple of days.  Bloating-she has been bloating on and off for the last few months. No known triggers such as antibiotics recently. She denies any constipation symptoms. She denies any nausea or vomiting. No blood in the stools. Her colonoscopy is up-to-date was performed in 2012. She does not take any supplements such as fiber.  She is not sure if she is lactose intolerant.   Review of Systems BP 104/71  Pulse 63  Ht 5\' 8"  (1.727 m)  Wt 169 lb (76.658 kg)  BMI 25.7 kg/m2    Allergies  Allergen Reactions  . Bupropion Hcl     REACTION: irritabillity  . Sulfa Antibiotics Itching    Past Medical History  Diagnosis Date  . Hypercholesterolemia   . Arthritis     Right Hip  . Hypothyroidism   . Chronic cystitis   . Decreased libido   . Dyspareunia   . Breast disorder      Rt Breast Calcifications  . Depression   . Anxiety   . Plantar fasciitis of left foot     2013    Past Surgical History  Procedure Laterality Date  . Scoliosis repair with harrington rods    . Breast enhancement surgery    . Plastic surgry to eyes      History   Social History  . Marital Status: Married    Spouse Name: N/A    Number of Children: N/A  . Years of Education: N/A    Occupational History  . Not on file.   Social History Main Topics  . Smoking status: Former Smoker    Quit date: 09/20/1982  . Smokeless tobacco: Never Used  . Alcohol Use: 0.0 oz/week    0.5 drink(s) per week     Comment: Use is 1-2 a month.  . Drug Use: No  . Sexually Active: Yes -- Female partner(s)   Other Topics Concern  . Not on file   Social History Narrative  . No narrative on file    Family History  Problem Relation Age of Onset  . COPD    . Aneurysm    . Anxiety disorder Mother   . Depression Mother   . Alcohol abuse Father   . Heart murmur Father   . Drug abuse Brother   . Anxiety disorder Brother   . Depression Brother   . Dementia Maternal Grandfather     Outpatient Encounter Prescriptions as of 02/06/2013  Medication Sig Dispense Refill  . busPIRone (BUSPAR) 15 MG tablet Take 1 tablet (15 mg total) by mouth 3 (three) times daily.  90 tablet  3  . calcium-vitamin D (OSCAL WITH D) 500-200 MG-UNIT per tablet Take 1 tablet by mouth  daily.        Marland Kitchen FLUoxetine (PROZAC) 40 MG capsule Take 2 capsules (80 mg total) by mouth daily.  180 capsule  1  . Garcinia Cambogia-Chromium 500-200 MG-MCG TABS Take 1 tablet by mouth 2 (two) times daily.      Marland Kitchen levothyroxine (SYNTHROID, LEVOTHROID) 75 MCG tablet TAKE 1 TABLET (75 MCG TOTAL) BY MOUTH DAILY.  90 tablet  0  . Multiple Vitamin (MULTIVITAMIN) tablet Take 1 tablet by mouth daily.        . nitrofurantoin (MACRODANTIN) 50 MG capsule Take 50 mg by mouth Daily.      . simvastatin (ZOCOR) 40 MG tablet TAKE 1 TABLET (40 MG TOTAL) BY MOUTH AT BEDTIME.  90 tablet  0   No facility-administered encounter medications on file as of 02/06/2013.          Objective:   Physical Exam  Constitutional: She is oriented to person, place, and time. She appears well-developed and well-nourished.  HENT:  Head: Normocephalic and atraumatic.  Right Ear: External ear normal.  Left Ear: External ear normal.  Nose: Nose normal.   Mouth/Throat: Oropharynx is clear and moist.  TMs and canals are clear.   Eyes: Conjunctivae and EOM are normal. Pupils are equal, round, and reactive to light.  Neck: Neck supple. No thyromegaly present.  Cardiovascular: Normal rate, regular rhythm and normal heart sounds.   Pulmonary/Chest: Effort normal and breath sounds normal. She has no wheezes.  Lymphadenopathy:    She has no cervical adenopathy.  Neurological: She is alert and oriented to person, place, and time.  Skin: Skin is warm and dry.  Psychiatric: She has a normal mood and affect.          Assessment & Plan:  Urinary incontinence - urinalysis only showed trace blood. Will send for a culture to confirm. I suspect it will be normal. I think she could have some possible bladder prolapse. Thus when she stands up for her bladder is able to more fully empty. She may also have some urethral narrowing, such as a stricture since she's also noticed that she sometimes has difficulty initiating urination.  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 I would like her to see ENT for further evaluation, especially since she feels like it has been getting larger and actually causing a little bit of obstruction when she tries to swallow pills.  Bloating - most likely benign especially with a history of normal colonoscopy in the last year. Recommend a trial of gradually increasing her fiber, increasing water and held in her bowels move. She might want to try an over-the-counter probiotic such as a line. She might even want to try a lactose free diet for 3-4 weeks to see if this helps as well. If she's not improving over the next 3-4 weeks asked her to call the office back and we will consider GI referral at that point in time.consider rechecking thyroid as well.

## 2013-02-07 LAB — CBC WITH DIFFERENTIAL/PLATELET
Eosinophils Absolute: 0.2 10*3/uL (ref 0.0–0.7)
Eosinophils Relative: 4 % (ref 0–5)
HCT: 41.3 % (ref 36.0–46.0)
Lymphocytes Relative: 32 % (ref 12–46)
Lymphs Abs: 2 10*3/uL (ref 0.7–4.0)
MCH: 29.5 pg (ref 26.0–34.0)
MCV: 86.9 fL (ref 78.0–100.0)
Monocytes Absolute: 0.4 10*3/uL (ref 0.1–1.0)
Platelets: 299 10*3/uL (ref 150–400)
RBC: 4.75 MIL/uL (ref 3.87–5.11)
RDW: 14.8 % (ref 11.5–15.5)
WBC: 6.2 10*3/uL (ref 4.0–10.5)

## 2013-02-07 LAB — COMPLETE METABOLIC PANEL WITH GFR
BUN: 15 mg/dL (ref 6–23)
CO2: 28 mEq/L (ref 19–32)
Calcium: 10 mg/dL (ref 8.4–10.5)
Chloride: 102 mEq/L (ref 96–112)
Creat: 0.79 mg/dL (ref 0.50–1.10)
GFR, Est African American: >89 mL/min
GFR, Est Non African American: 83 mL/min
Total Bilirubin: 0.6 mg/dL (ref 0.3–1.2)

## 2013-02-07 LAB — GAMMA GT: GGT: 17 U/L (ref 7–51)

## 2013-02-07 LAB — AMYLASE: Amylase: 44 U/L (ref 0–105)

## 2013-02-07 LAB — LIPASE: Lipase: 26 U/L (ref 0–75)

## 2013-03-16 ENCOUNTER — Encounter: Payer: Self-pay | Admitting: Family Medicine

## 2013-03-16 ENCOUNTER — Ambulatory Visit (INDEPENDENT_AMBULATORY_CARE_PROVIDER_SITE_OTHER): Payer: 59 | Admitting: Family Medicine

## 2013-03-16 VITALS — BP 116/76 | HR 76 | Temp 98.5°F | Ht 68.0 in | Wt 168.0 lb

## 2013-03-16 DIAGNOSIS — S61409A Unspecified open wound of unspecified hand, initial encounter: Secondary | ICD-10-CM

## 2013-03-16 DIAGNOSIS — W5501XA Bitten by cat, initial encounter: Secondary | ICD-10-CM

## 2013-03-16 MED ORDER — AMOXICILLIN-POT CLAVULANATE 875-125 MG PO TABS: 1.0000 | ORAL_TABLET | Freq: Two times a day (BID) | ORAL | Status: DC

## 2013-03-16 NOTE — Progress Notes (Signed)
  Subjective:    Patient ID: [name], female    DOB: 1956/03/06, 57 y.o.   MRN: [id number]  HPI Cat bit 3 weeks ago.  Puncture wound.  Says looked fine until yesterday started getting red and hot. Worse today.  No fever.   No drainage from the wound.  She has been using Neosporin on it.  She has a cold so feels a little run down but no fever.    Review of Systems     Objective:   Physical Exam  Constitutional: She appears well-developed and well-nourished.  HENT:  Head: Normocephalic and atraumatic.  Skin: Skin is warm and dry.  Erythema on posterior right hand. There is a puncture wound in the center or dorsum of hand.  Erythema from knuckles to wrist (13 cm) x 9 cm.    Psychiatric: She has a normal mood and affect. Her behavior is normal.          Assessment & Plan:  Cat bite -will start Augmentin twice a day. She might notice a slight increase of erythema in the next 4 hours until he had boxers working but if she notices any streaking towards the elbow or into the fingers and she is to call our office immediately. Call if any active drainage from the wound. She can use cool compresses for comfort. Make sure complete full course of antibiotics. If it's getting worse and I explained her that she might need to go to the hospital for IV antibiotics.

## 2013-03-16 NOTE — Patient Instructions (Signed)
Animal Bite An animal bite can result in a scratch on the skin, deep open cut, puncture of the skin, crush injury, or tearing away of the skin or a body part. Dogs are responsible for most animal bites. Children are bitten more often than adults. An animal bite can range from very mild to more serious. A small bite from your house pet is no cause for alarm. However, some animal bites can become infected or injure a bone or other tissue. You must seek medical care if:  The skin is broken and bleeding does not slow down or stop after 15 minutes.  The puncture is deep and difficult to clean (such as a cat bite).  Pain, warmth, redness, or pus develops around the wound.  The bite is from a stray animal or rodent. There may be a risk of rabies infection.  The bite is from a snake, raccoon, skunk, fox, coyote, or bat. There may be a risk of rabies infection.  The person bitten has a chronic illness such as diabetes, liver disease, or cancer, or the person takes medicine that lowers the immune system.  There is concern about the location and severity of the bite. It is important to clean and protect an animal bite wound right away to prevent infection. Follow these steps:  Clean the wound with plenty of water and soap.  Apply an antibiotic cream.  Apply gentle pressure over the wound with a clean towel or gauze to slow or stop bleeding.  Elevate the affected area above the heart to help stop any bleeding.  Seek medical care. Getting medical care within 8 hours of the animal bite leads to the best possible outcome. DIAGNOSIS  Your caregiver will most likely:  Take a detailed history of the animal and the bite injury.  Perform a wound exam.  Take your medical history. Blood tests or X-rays may be performed. Sometimes, infected bite wounds are cultured and sent to a lab to identify the infectious bacteria.  TREATMENT  Medical treatment will depend on the location and type of animal bite as  well as the patient's medical history. Treatment may include:  Wound care, such as cleaning and flushing the wound with saline solution, bandaging, and elevating the affected area.  Antibiotics.  Tetanus immunization.  Rabies immunization.  Leaving the wound open to heal. This is often done with animal bites, due to the high risk of infection. However, in certain cases, wound closure with stitches, wound adhesive, skin adhesive strips, or staples may be used. Infected bites that are left untreated may require intravenous (IV) antibiotics and surgical treatment in the hospital. HOME CARE INSTRUCTIONS  Follow your caregiver's instructions for wound care.  Take all medicines as directed.  If your caregiver prescribes antibiotics, take them as directed. Finish them even if you start to feel better.  Follow up with your caregiver for further exams or immunizations as directed. You may need a tetanus shot if:  You cannot remember when you had your last tetanus shot.  You have never had a tetanus shot.  The injury broke your skin. If you get a tetanus shot, your arm may swell, get red, and feel warm to the touch. This is common and not a problem. If you need a tetanus shot and you choose not to have one, there is a rare chance of getting tetanus. Sickness from tetanus can be serious. SEEK MEDICAL CARE IF:  You notice warmth, redness, soreness, swelling, pus discharge, or a bad   smell coming from the wound.  You have a red line on the skin coming from the wound.  You have a fever, chills, or a general ill feeling.  You have nausea or vomiting.  You have continued or worsening pain.  You have trouble moving the injured part.  You have other questions or concerns. MAKE SURE YOU:  Understand these instructions.  Will watch your condition.  Will get help right away if you are not doing well or get worse. Document Released: 08/25/2011 Document Revised: 02/29/2012 Document  Reviewed: 08/25/2011 Exit[employer] Patient Information 2013 [employer].  

## 2013-04-04 ENCOUNTER — Other Ambulatory Visit: Payer: Self-pay | Admitting: Family Medicine

## 2013-04-09 ENCOUNTER — Other Ambulatory Visit (HOSPITAL_COMMUNITY): Payer: Self-pay | Admitting: Psychiatry

## 2013-04-12 ENCOUNTER — Other Ambulatory Visit: Payer: Self-pay | Admitting: Family Medicine

## 2013-04-12 DIAGNOSIS — Z1231 Encounter for screening mammogram for malignant neoplasm of breast: Secondary | ICD-10-CM

## 2013-04-24 ENCOUNTER — Ambulatory Visit (HOSPITAL_COMMUNITY): Payer: Self-pay | Admitting: Psychiatry

## 2013-04-26 ENCOUNTER — Encounter (HOSPITAL_COMMUNITY): Payer: Self-pay | Admitting: Psychiatry

## 2013-04-26 ENCOUNTER — Ambulatory Visit (INDEPENDENT_AMBULATORY_CARE_PROVIDER_SITE_OTHER): Payer: 59 | Admitting: Psychiatry

## 2013-04-26 VITALS — BP 94/71 | HR 70 | Ht 68.0 in | Wt 168.0 lb

## 2013-04-26 DIAGNOSIS — F419 Anxiety disorder, unspecified: Secondary | ICD-10-CM

## 2013-04-26 DIAGNOSIS — F332 Major depressive disorder, recurrent severe without psychotic features: Secondary | ICD-10-CM

## 2013-04-26 DIAGNOSIS — F4321 Adjustment disorder with depressed mood: Secondary | ICD-10-CM

## 2013-04-26 MED ORDER — FLUOXETINE HCL 40 MG PO CAPS: 80.0000 mg | ORAL_CAPSULE | Freq: Every day | ORAL | Status: DC

## 2013-04-26 MED ORDER — BUSPIRONE HCL 15 MG PO TABS: 15.0000 mg | ORAL_TABLET | Freq: Three times a day (TID) | ORAL | Status: DC

## 2013-04-26 NOTE — Progress Notes (Signed)
[name]    [name] Follow-up Outpatient Visit  [name] [birth year]  Date: 04/26/2013   Chief Complaint:  Follow Up.  History of Chief Complaint:  [name] is a 57 y/o female with a past psychiatric history significant for Major Depression, Recurrent severe. The patient is referred for psychiatric services for medication management.   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 She is doing well. She denies any current stressors and is enjoying her job and life. She states that she is taking her medications and denies any side effects.   In the area of affective symptoms, patient appears euthymic. Patient denies current suicidal ideation, intent, or plan. Patient denies current homicidal ideation, intent, or plan. Patient denies auditory hallucinations. Patient denies visual hallucinations. Patient denies symptoms of paranoia. Patient states sleep is good, with approximately  8-10 hours of sleep per night, she is not using medications sleep. She reports appetite is good. Energy level is fair. Patient denies symptoms of anhedonia. Patient denies hopelessness, helplessness, or guilt.   Denies any recent episodes consistent with mania, particularly decreased need for sleep with increased energy, grandiosity, impulsivity, hyperverbal and pressured speech, or increased productivity. Denies any recent symptoms consistent with psychosis, particularly auditory or visual hallucinations, thought broadcasting/insertion/withdrawal, or ideas of reference. Also denies excessive worry to the point of physical symptoms as well as any panic attacks. Denies any history of trauma or symptoms consistent with PTSD such as flashbacks, nightmares, hypervigilance, feelings of numbness or inability to connect with others.   Review of Systems  Constitutional: Negative for fever, chills, diaphoresis, activity  change, fatigue and unexpected weight change.  Cardiovascular: Negative.  Gastrointestinal:  Negative for nausea, vomiting, abdominal pain, diarrhea, constipation, blood in stool, abdominal distention, anal bleeding and rectal pain.  Musculoskeletal: Patient denies muscle weakness, or gait weakness. Neurological:Negative for dizziness, tremors, seizures, syncope, facial asymmetry, speech difficulty, weakness, light-headedness, numbness and headaches.   Filed Vitals:   04/26/13 1526  BP: 94/71  Pulse: 70  Height: 5\' 8"  (1.727 m)  Weight: 168 lb (76.204 kg)   Physical Exam  Vitals reviewed.  Constitutional: She appears well-developed and well-nourished. No distress.  Skin: She is not diaphoretic.   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   Suicidal Attempts: Abient denies   Violent Behaviors: Patient has been verbally aggressive.    Past Medical History: Reviewed Past Medical History  Diagnosis Date  . Hypercholesterolemia   . Arthritis     Right Hip  . Hypothyroidism   . Chronic cystitis   . Dyspareunia   . Breast disorder      Rt Breast Calcifications  . Depression   . Anxiety   . Plantar fasciitis of left foot     2013   History of Loss of Consciousness: No  Seizure History: No  Cardiac History: No   Allergies: Reviewed Allergies  Allergen Reactions  . Bupropion Hcl     REACTION: irritabillity  . Sulfa Antibiotics Itching   Current Medications: Reviewed Current Outpatient Prescriptions on File Prior to Visit  Medication Sig Dispense Refill  . busPIRone (BUSPAR) 15 MG tablet Take 1 tablet (15 mg total) by mouth  3 (three) times daily.  90 tablet  3  . calcium-vitamin D (OSCAL WITH  D) 500-200 MG-UNIT per tablet Take 1 tablet by mouth daily.        Marland Kitchen FLUoxetine (PROZAC) 40 MG capsule Take 2 capsules (80 mg total) by mouth daily.  180 capsule  1  . Garcinia Cambogia-Chromium 500-200 MG-MCG TABS Take 1 tablet by mouth 2 (two) times daily.      Marland Kitchen levothyroxine (SYNTHROID, LEVOTHROID) 75 MCG tablet TAKE 1 TABLET (75 MCG TOTAL) BY MOUTH DAILY.  90 tablet  0  . Multiple Vitamin (MULTIVITAMIN) tablet Take 1 tablet by mouth daily.        . simvastatin (ZOCOR) 40 MG tablet TAKE 1 TABLET (40 MG TOTAL) BY MOUTH AT BEDTIME.  90 tablet  0  . nitrofurantoin (MACRODANTIN) 50 MG capsule Take 50 mg by mouth Daily.       No current facility-administered medications on file prior to visit.   Previous Psychotropic Medications: Reviewed Medication   Prozac   Clonazapam   Buspirone   Alprazolam.    Substance Abuse History in the last 12 months: Reviewed History  Substance Use Topics  . Smoking status: Former Smoker    Quit date: 09/20/1982  . Smokeless tobacco: Never Used  . Alcohol Use: 0.0 oz/week    0.5 drink(s) per week     Comment: Use is 1-2 a month.   Illicit drugs: Patient admitts to cocaine and marijuana use in the 1980s.  Caffienated beverage: 4 cups.  Medical Consequences of Substance Abuse: N/A  Legal Consequences of Substance Abuse: N/A  Family Consequences of Substance Abuse: N/A  Blackouts: Yes  DT's: No  Withdrawal Symptoms: No None   Social History: Reviewed Current Place of Residence: [place], [place]  Place of Birth: [place], [place]  Family Members: The patient has 2 brothers, and 2 sister. The patient  Marital Status: Married-First marriage 22 years.  Children: None  Relationships: It was her mother, later her brother, now her older sister, ([name].)  Education: HS Graduate  Educational Problems/Performance: Dyslexia.  Religious Beliefs/Practices: None  History of Abuse: none  Occupational Experiences: Currently unemployed Military History: None.  Legal  History: None  Hobbies/Interests:Scrapbook, watching house hunting shows, take pictures   Family History: Reviewed Family History   Problem  Relation  Age of Onset   .  COPD     .  Aneurysm     .  Anxiety disorder  Mother    .  Depression  Mother    .  Alcohol abuse  Father    .  Heart murmur  Father    .  Drug abuse  Brother    .  Anxiety disorder  Brother    .  Depression  Brother    .  Dementia  Maternal Grandfather     Mental Status Examination/Evaluation:  Objective: Appearance: Casual and Well Groomed   Eye Contact:: Good   Speech: Clear and Coherent and Normal Rate   Volume: Normal   Mood: "Pretty Good" 8/10  Affect: Congruent and Full Range   Thought Process: Coherent, Logical and Loose   Orientation: Full   Thought Content: WDL   Suicidal Thoughts: No   Homicidal Thoughts: No   Judgement: Fair   Insight: Fair   Psychomotor Activity: Normal   Akathisia: No   Handed: Left   Memory: Immediate 3/3, recent: 3/3   Assets: Communication Skills  Desire for Improvement  Financial Resources/Insurance  Housing  Leisure Time  Physical Health  Resilience  Transportation  Vocational/Educational    Laboratory/X-Ray  Psychological Evaluation(s)   NONE  NONE    Assessment:  AXIS I  Major Depression, Recurrent severe   AXIS II  No diagnosis   AXIS III  Past Medical History    Diagnosis  Date    .  Hypercholesterolemia     .  Arthritis       Right Hip    .  Hypothyroidism     .  Chronic cystitis     .  Decreased libido     .  Dyspareunia     .  Breast disorder       Rt Breast Calcifications    .  Depression     .  Anxiety     .  Plantar fasciitis of left foot       2013      AXIS IV  other psychosocial or environmental problems   AXIS V  51-60 moderate symptoms    Treatment Plan/Recommendations:  PLAN:  1. Affirm with the patient that the medications are taken as ordered. Patient expressed understanding of how their medications were to be used.  2.  Continue the following psychiatric medications as written prior to this appointment with the following changes:  a) Prozac 80 mg.  b) Will continue buspirone to 15 mg TID C)Discontinue clonazepam. 3. Therapy: brief supportive therapy provided. Continue current services. Discussed psychosocial stressors.  4. Risks and benefits, side effects and alternatives discussed with patient, she was given an opportunity to ask questions about her medication, illness, and treatment. All current psychiatric medications have been reviewed and discussed with the patient and adjusted as clinically appropriate. The patient has been provided an accurate and updated list of the medications being now prescribed.  5. Patient told to call clinic if any problems occur. Patient advised to go to ER if she should develop SI/HI, side effects, or if symptoms worsen. Has crisis numbers to call if needed.  6. No labs warranted at this time.  7. The patient was encouraged to keep all PCP and specialty clinic appointments.  8. Patient was instructed to return to clinic in 4 months.  9. The patient was advised to call and cancel their mental health appointment within 24 hours of the appointment, if they are unable to keep the appointment, as well as the three no show and termination from clinic policy.  10. The patient expressed understanding of the plan and agrees with the above.  Jacqulyn Cane, M.D.  04/26/2013 3:21 PM

## 2013-05-01 ENCOUNTER — Ambulatory Visit (HOSPITAL_BASED_OUTPATIENT_CLINIC_OR_DEPARTMENT_OTHER)
Admission: RE | Admit: 2013-05-01 | Discharge: 2013-05-01 | Disposition: A | Discharge status: Home / Self Care | Payer: 59 | Source: Ambulatory Visit | Attending: Family Medicine | Admitting: Family Medicine

## 2013-05-01 DIAGNOSIS — Z1231 Encounter for screening mammogram for malignant neoplasm of breast: Secondary | ICD-10-CM

## 2013-05-04 ENCOUNTER — Ambulatory Visit: Payer: Self-pay | Admitting: Obstetrics & Gynecology

## 2013-05-16 ENCOUNTER — Other Ambulatory Visit: Payer: Self-pay | Admitting: Family Medicine

## 2013-06-02 ENCOUNTER — Telehealth (HOSPITAL_COMMUNITY): Payer: Self-pay | Admitting: Psychiatry

## 2013-06-02 DIAGNOSIS — F419 Anxiety disorder, unspecified: Secondary | ICD-10-CM

## 2013-06-02 DIAGNOSIS — F4321 Adjustment disorder with depressed mood: Secondary | ICD-10-CM

## 2013-06-02 MED ORDER — BUSPIRONE HCL 15 MG PO TABS: 15.0000 mg | ORAL_TABLET | Freq: Three times a day (TID) | ORAL | Status: DC

## 2013-06-02 NOTE — Telephone Encounter (Signed)
Fax request for 90 day supply of buspirone will fill the same.

## 2013-06-25 ENCOUNTER — Other Ambulatory Visit: Payer: Self-pay | Admitting: Family Medicine

## 2013-06-28 ENCOUNTER — Other Ambulatory Visit: Payer: Self-pay | Admitting: Family Medicine

## 2013-06-28 NOTE — Telephone Encounter (Signed)
Is this ok to fill. She last got on 07/19/2012 under med history. Barry Dienes, LPN

## 2013-07-11 ENCOUNTER — Ambulatory Visit: Payer: Self-pay | Admitting: Obstetrics & Gynecology

## 2013-08-01 ENCOUNTER — Ambulatory Visit (INDEPENDENT_AMBULATORY_CARE_PROVIDER_SITE_OTHER): Payer: 59 | Admitting: Obstetrics & Gynecology

## 2013-08-01 ENCOUNTER — Encounter: Payer: Self-pay | Admitting: Obstetrics & Gynecology

## 2013-08-01 VITALS — BP 99/72 | HR 66 | Resp 16 | Ht 68.5 in | Wt 164.0 lb

## 2013-08-01 DIAGNOSIS — Z01419 Encounter for gynecological examination (general) (routine) without abnormal findings: Secondary | ICD-10-CM

## 2013-08-02 NOTE — Progress Notes (Signed)
  Subjective:    [name] is a 57 y.o. female who presents for an annual exam. The patient has no complaints today. Pt is off HRT and doing well.  The patient is sexually active. GYN screening history: last pap: was normal. The patient wears seatbelts: yes. The patient participates in regular exercise: yes. Has the patient ever been transfused or tattooed?: not asked. The patient reports that there is not domestic violence in her life.   Menstrual History: OB History   Grav Para Term Preterm Abortions TAB SAB Ect Mult Living   0                No LMP recorded. Patient is postmenopausal.    The following portions of the patient's history were reviewed and updated as appropriate: allergies, current medications, past family history, past medical history, past social history, past surgical history and problem list.  Review of Systems A comprehensive review of systems was negative.    Objective:   Filed Vitals:   08/01/13 1536  BP: 99/72  Pulse: 66  Resp: 16  Height: 5' 8.5" (1.74 m)  Weight: 164 lb (74.39 kg)      Vitals:  WNL General appearance: alert, cooperative and no distress Head: Normocephalic, without obvious abnormality, atraumatic Eyes: negative Throat: lips, mucosa, and tongue normal; teeth and gums normal Lungs: clear to auscultation bilaterally Breasts: normal appearance, no masses or tenderness, No nipple retraction or dimpling, No nipple discharge or bleeding.  Saline implants. Heart: regular rate and rhythm Abdomen: soft, non-tender; bowel sounds normal; no masses,  no organomegaly Pelvic: cervix normal in appearance, external genitalia normal, no adnexal masses or tenderness, no bladder tenderness, no cervical motion tenderness, perianal skin: no external genital warts noted, rectovaginal septum normal, urethra without abnormality or discharge, uterus normal size, shape, and consistency and vagina normal without discharge Extremities: no edema, redness or  tenderness in the calves or thighs Skin: no lesions or rash Lymph nodes: Axillary adenopathy: none    .    Assessment:    Healthy female exam.    Plan:     Follow up in 1 years. Mammogram. nml this year.   Pap not indicated this year.  Q3 yr testing.

## 2013-08-11 ENCOUNTER — Telehealth: Payer: Self-pay | Admitting: Physician Assistant

## 2013-08-11 NOTE — Telephone Encounter (Signed)
Call pt: insurance sent letter might not be taking zocor daily. Let her know really important to stay on her statin for cardiovascular health.

## 2013-08-11 NOTE — Telephone Encounter (Signed)
Left message for patient to return call.

## 2013-08-14 NOTE — Telephone Encounter (Signed)
Pt returned call and states she has been forgetting to take Zocor at night but she will start back taking it on a routine basis. Barry Dienes, LPN

## 2013-08-28 ENCOUNTER — Ambulatory Visit (HOSPITAL_COMMUNITY): Payer: Self-pay | Admitting: Psychiatry

## 2013-09-15 ENCOUNTER — Encounter (HOSPITAL_COMMUNITY): Payer: Self-pay | Admitting: Psychiatry

## 2013-09-15 ENCOUNTER — Ambulatory Visit (INDEPENDENT_AMBULATORY_CARE_PROVIDER_SITE_OTHER): Payer: 59 | Admitting: Psychiatry

## 2013-09-15 VITALS — BP 116/79 | HR 71 | Ht 68.5 in | Wt 164.0 lb

## 2013-09-15 DIAGNOSIS — F4321 Adjustment disorder with depressed mood: Secondary | ICD-10-CM

## 2013-09-15 DIAGNOSIS — F419 Anxiety disorder, unspecified: Secondary | ICD-10-CM

## 2013-09-15 DIAGNOSIS — F332 Major depressive disorder, recurrent severe without psychotic features: Secondary | ICD-10-CM

## 2013-09-15 MED ORDER — BUSPIRONE HCL 15 MG PO TABS: 15.0000 mg | ORAL_TABLET | Freq: Two times a day (BID) | ORAL | Status: DC

## 2013-09-15 MED ORDER — FLUOXETINE HCL 40 MG PO CAPS: 80.0000 mg | ORAL_CAPSULE | Freq: Every day | ORAL | Status: DC

## 2013-09-15 NOTE — Progress Notes (Signed)
[facility] Follow-up Outpatient Visit  [name] 1956-09-13  Date: 09/15/2013   Chief Complaint:  Follow Up.  History of Chief Complaint:  [name] is a 57 y/o female with a past psychiatric history significant for Major Depression, Recurrent severe. The patient is referred for psychiatric services for medication management.   The patient report she tried to take herself off Prozac about 6 weeks but after 3 weeks she noticed a worsening of depression and anxiety.  The patient reports she restarted her medications 3 weeks ago and is starting to notice some improvement. She reports she has been able to decrease buspirone from 45 mg QAM to 30 mg QAM. She has started a walking group yesterday. She reports she is taking her medications and denies any denies.  In the area of affective symptoms, patient appears euthymic. Patient denies current suicidal ideation, intent, or plan. Patient denies current homicidal ideation, intent, or plan. Patient denies auditory hallucinations. Patient denies visual hallucinations. Patient denies symptoms of paranoia. Patient states sleep is good, with approximately  8-10  hours of sleep per night.  She reports appetite is good. Energy level is fair to low. Patient denies symptoms of anhedonia. Patient denies hopelessness, helplessness, or guilt.   Denies any recent episodes consistent with mania, particularly decreased need for sleep with increased energy, grandiosity, impulsivity, hyperverbal and pressured speech, or increased productivity. Denies any recent symptoms consistent with psychosis, particularly auditory or visual hallucinations, thought broadcasting/insertion/withdrawal, or ideas of reference. Also denies excessive worry to the point of physical symptoms as well as any panic attacks. Denies any history of trauma or symptoms consistent with PTSD such as flashbacks, nightmares, hypervigilance, feelings of numbness or inability to connect with  others.   Review of Systems  Constitutional: Negative for fever, chills, diaphoresis, activity change, fatigue and unexpected weight change.  Cardiovascular: Negative.  Gastrointestinal:  Negative for nausea, vomiting, abdominal pain, diarrhea, constipation, blood in stool, abdominal distention, anal bleeding and rectal pain.  Musculoskeletal: Patient denies muscle weakness, or gait weakness. Neurological:Negative for dizziness, tremors, seizures, syncope, facial asymmetry, speech difficulty, weakness, light-headedness, numbness and headaches.   Filed Vitals:   09/15/13 1021  BP: 116/79  Pulse: 71  Height: 5' 8.5" (1.74 m)  Weight: 164 lb (74.39 kg)   Physical Exam  Vitals reviewed.  Constitutional: She appears well-developed and well-nourished. No distress.  Skin: She is not diaphoretic.  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   Suicidal Attempts: Abient denies   Violent Behaviors: Patient has been verbally aggressive.    Past Medical History: Reviewed Past Medical History  Diagnosis Date  . Hypercholesterolemia   . Arthritis     Right Hip  . Hypothyroidism   . Chronic cystitis   . Dyspareunia   . Breast disorder      Rt Breast Calcifications  . Depression   . Anxiety   . Plantar fasciitis of left foot     2013   History of Loss of Consciousness: No  Seizure History: No  Cardiac History: No   Allergies: Reviewed Allergies  Allergen Reactions  . Bupropion Hcl     REACTION: irritabillity  .  Sulfa Antibiotics Itching   Current Medications: Reviewed Current Outpatient  Prescriptions on File Prior to Visit  Medication Sig Dispense Refill  . calcium-vitamin D (OSCAL WITH D) 500-200 MG-UNIT per tablet Take 1 tablet by mouth daily.        . clonazePAM (KLONOPIN) 0.5 MG tablet TAKE 1 TABLET BY MOUTH EVERY DAY AS NEEDED  90 tablet  0  . FLUoxetine (PROZAC) 40 MG capsule Take 2 capsules (80 mg total) by mouth daily.  180 capsule  1  . Garcinia Cambogia-Chromium 500-200 MG-MCG TABS Take 1 tablet by mouth 2 (two) times daily.      Marland Kitchen levothyroxine (SYNTHROID, LEVOTHROID) 75 MCG tablet TAKE 1 TABLET (75 MCG TOTAL) BY MOUTH DAILY.  90 tablet  0  . Multiple Vitamin (MULTIVITAMIN) tablet Take 1 tablet by mouth daily.        . simvastatin (ZOCOR) 40 MG tablet TAKE 1 TABLET (40 MG TOTAL) BY MOUTH AT BEDTIME.  90 tablet  0  . nitrofurantoin (MACRODANTIN) 50 MG capsule Take 50 mg by mouth Daily.       No current facility-administered medications on file prior to visit.   Previous Psychotropic Medications: Reviewed Medication   Prozac   Clonazapam   Buspirone   Alprazolam.    Substance Abuse History in the last 12 months: Reviewed History  Substance Use Topics  . Smoking status: Former Smoker    Quit date: 09/20/1982  . Smokeless tobacco: Never Used  . Alcohol Use: 0.0 oz/week    0.5 drink(s) per week     Comment: Use is 1-2 a month.   Illicit drugs: Patient admitts to cocaine and marijuana use in the 1980s.  Caffienated beverage: 4 cups.  Medical Consequences of Substance Abuse: N/A  Legal Consequences of Substance Abuse: N/A  Family Consequences of Substance Abuse: N/A  Blackouts: Yes  DT's: No  Withdrawal Symptoms: No None   Social History: Reviewed Current Place of Residence: [place], [place]  Place of Birth: [place], [place]  Family Members: The patient has 2 brothers, and 2 sister. The patient  Marital Status: Married-First marriage 22 years.  Children: None  Relationships: It was her mother, later her brother, now her older sister, (Ginger.)  Education:  HS Graduate  Educational Problems/Performance: Dyslexia.  Religious Beliefs/Practices: None  History of Abuse: none  Occupational Experiences: She is currently working and enjoys her job. Military History: None.  Legal History: None  Hobbies/Interests:Scrapbook, watching [place] hunting shows, take pictures   Family History: Reviewed Family History   Problem  Relation  Age of Onset   .  COPD     .  Aneurysm     .  Anxiety disorder  Mother    .  Depression  Mother    .  Alcohol abuse  Father    .  Heart murmur  Father    .  Drug abuse  Brother    .  Anxiety disorder  Brother    .  Depression  Brother    .  Dementia  Maternal Grandfather     Psychiatric Specialty Exam:  Objective: Appearance: Casual and Well Groomed   Eye Contact:: Good   Speech: Clear and Coherent and Normal Rate   Volume: Normal   Mood: "Good"  Depression: 6/10 (0=Very depressed; 5=Neutral; 10=Very Happy)  Anxiety- 4/10 (0=no anxiety; 5= moderate/tolerable anxiety; 10= panic attacks)  Affect: Congruent and Full Range   Thought Process: Coherent, Logical and Loose   Orientation: Full   Thought Content: WDL  Suicidal Thoughts: No   Homicidal Thoughts: No   Judgement: Fair   Insight: Fair   Psychomotor Activity: Normal   Akathisia: No   Handed: Left   Memory: Immediate 3/3, recent: 3/3   Assets: Communication Skills  Desire for Improvement  Financial Resources/Insurance  Housing  Leisure Time  Physical Health  Resilience  Transportation  Vocational/Educational    Laboratory/X-Ray  Psychological Evaluation(s)   NONE  NONE    Assessment:  AXIS I  Major Depression, Recurrent severe   AXIS II  No diagnosis   AXIS III  Past Medical History    Diagnosis  Date    .  Hypercholesterolemia     .  Arthritis       Right Hip    .  Hypothyroidism     .  Chronic cystitis     .  Decreased libido     .  Dyspareunia     .  Breast disorder       Rt Breast Calcifications    .  Depression     .  Anxiety      .  Plantar fasciitis of left foot       2013      AXIS IV  other psychosocial or environmental problems   AXIS V  51-60 moderate symptoms    Treatment Plan/Recommendations:  PLAN:  1. Affirm with the patient that the medications are taken as ordered. Patient expressed understanding of how their medications were to be used.  2. Continue the following psychiatric medications as written prior to this appointment with the following changes:  a) Prozac 80 mg.  b) Will continue buspirone to 15 mg BID C)Discontinue clonazepam. 3. Therapy: brief supportive therapy provided. Continue current services. Discussed psychosocial stressors.  4. Risks and benefits, side effects and alternatives discussed with patient, she was given an opportunity to ask questions about her medication, illness, and treatment. All current psychiatric medications have been reviewed and discussed with the patient and adjusted as clinically appropriate. The patient has been provided an accurate and updated list of the medications being now prescribed.  5. Patient told to call clinic if any problems occur. Patient advised to go to ER if she should develop SI/HI, side effects, or if symptoms worsen. Has crisis numbers to call if needed.  6. No labs warranted at this time.  7. The patient was encouraged to keep all PCP and specialty clinic appointments.  8. Patient was instructed to return to clinic in 2 months.  9. The patient was advised to call and cancel their mental health appointment within 24 hours of the appointment, if they are unable to keep the appointment, as well as the three no show and termination from clinic policy.  10. The patient expressed understanding of the plan and agrees with the above.  Jacqulyn Cane, M.D.  09/15/2013 10:18 AM

## 2013-09-18 ENCOUNTER — Other Ambulatory Visit: Payer: Self-pay | Admitting: Family Medicine

## 2013-09-18 ENCOUNTER — Other Ambulatory Visit: Payer: Self-pay | Admitting: *Deleted

## 2013-09-18 NOTE — Telephone Encounter (Signed)
Needs f/u lab work

## 2013-11-03 ENCOUNTER — Encounter: Payer: Self-pay | Admitting: Family Medicine

## 2013-11-03 ENCOUNTER — Ambulatory Visit (INDEPENDENT_AMBULATORY_CARE_PROVIDER_SITE_OTHER): Payer: 59 | Admitting: Family Medicine

## 2013-11-03 VITALS — BP 104/70 | HR 97 | Temp 98.1°F | Wt 161.0 lb

## 2013-11-03 DIAGNOSIS — M545 Low back pain: Secondary | ICD-10-CM

## 2013-11-03 DIAGNOSIS — M4120 Other idiopathic scoliosis, site unspecified: Secondary | ICD-10-CM

## 2013-11-03 DIAGNOSIS — G8929 Other chronic pain: Secondary | ICD-10-CM

## 2013-11-03 MED ORDER — METAXALONE 800 MG PO TABS: 800.0000 mg | ORAL_TABLET | Freq: Three times a day (TID) | ORAL | Status: DC

## 2013-11-03 MED ORDER — HYDROCODONE-ACETAMINOPHEN 10-325 MG PO TABS: 1.0000 | ORAL_TABLET | Freq: Two times a day (BID) | ORAL | Status: DC | PRN

## 2013-11-03 NOTE — Progress Notes (Signed)
  Subjective:    Patient ID: [name], female    DOB: [birth year], 57 y.o.   MRN: [id number]  HPI Followup chronic low back pain and scoliosis. Also last saw her was in August of 2013 and we discussed adding additional strategies to help control her pain besides medications. She did PT last year and that was helpful. Does help but then when stops it etc worse.  Today pain is lumbar and is tight.  Had rod in her upper back. Icing really helps.     Review of Systems     Objective:   Physical Exam  Constitutional: She appears well-developed and well-nourished.  HENT:  Head: Normocephalic and atraumatic.  Eyes: Conjunctivae are normal. Pupils are equal, round, and reactive to light.  Musculoskeletal:  Normal lumbar flexion, extension, rotation right and left, side bending. She is nontender over the lumbar spine or paraspinous muscles. Nontender over the SI joints.  Skin: Skin is warm and dry.  Psychiatric: She has a normal mood and affect. Her behavior is normal.          Assessment & Plan:  Chronic back pain/scoliosis - she is okay with doing physical therapy again since it was helpful. Insurance will cover this at least once a year and if it's helpful I think is definitely worth doing. She can continue work with a [employer] as needed. Did refill her hydrocodone today. I also wrote for Skelaxin to see if that would help during the daytime without making her too sedated at work. Followup as needed.  The did recommend she followup for her other medical problems including her thyroid in February.

## 2013-11-08 ENCOUNTER — Ambulatory Visit (HOSPITAL_COMMUNITY): Payer: 59 | Admitting: Psychiatry

## 2013-11-13 ENCOUNTER — Ambulatory Visit (HOSPITAL_COMMUNITY): Payer: Self-pay | Admitting: Psychiatry

## 2013-11-20 ENCOUNTER — Telehealth: Payer: Self-pay | Admitting: *Deleted

## 2013-11-20 NOTE — Telephone Encounter (Signed)
Agree, needs appt for possible UTI

## 2013-11-20 NOTE — Telephone Encounter (Signed)
Pt called and wanted to know if she could get a refill of macrodantin. She stated that her urine has been cloudy and frequent. I told her that she may need to come in to give a urine sample.[name] [place]

## 2013-11-21 NOTE — Telephone Encounter (Signed)
Called pt and lvm informing her that she will need to make and appt for possible UTI and have her urine checked told to call and make an appt for this .[name] [place]

## 2013-11-23 ENCOUNTER — Encounter: Payer: Self-pay | Admitting: Emergency Medicine

## 2013-11-23 ENCOUNTER — Ambulatory Visit: Payer: Self-pay | Admitting: Sports Medicine

## 2013-11-23 ENCOUNTER — Emergency Department (INDEPENDENT_AMBULATORY_CARE_PROVIDER_SITE_OTHER)
Admission: EM | Admit: 2013-11-23 | Discharge: 2013-11-23 | Disposition: A | Discharge status: Home / Self Care | Payer: 59 | Source: Home / Self Care

## 2013-11-23 DIAGNOSIS — R300 Dysuria: Secondary | ICD-10-CM

## 2013-11-23 LAB — POCT URINALYSIS DIP (MANUAL ENTRY)
Bilirubin, UA: NEGATIVE
Glucose, UA: NEGATIVE
Ketones, POC UA: NEGATIVE
Nitrite, UA: POSITIVE
Spec Grav, UA: 1.015 (ref 1.005–1.03)
Urobilinogen, UA: 0.2 (ref 0–1)

## 2013-11-23 MED ORDER — CEPHALEXIN 500 MG PO CAPS: 500.0000 mg | ORAL_CAPSULE | Freq: Three times a day (TID) | ORAL | Status: DC

## 2013-11-23 MED ORDER — NITROFURANTOIN MACROCRYSTAL 50 MG PO CAPS: 50.0000 mg | ORAL_CAPSULE | Freq: Every day | ORAL | Status: DC | PRN

## 2013-11-23 NOTE — ED Notes (Signed)
Dysuria x 1 week

## 2013-11-23 NOTE — ED Provider Notes (Signed)
CSN: [id number]     Arrival date & time 11/23/13  1800 History   None    Chief Complaint  Patient presents with  . Dysuria    HPI  DYSURIA Onset:  1 week  Description: dysuria, increased urinary frequency  Modifying factors: hx/o recurrent/postcoital UTIs. Ran out of ppx macrobid  Symptoms Urgency:  yes Frequency: yes  Hesitancy:  yes Hematuria:  no Flank Pain:  no Fever: no Nausea/Vomiting:  no Missed LMP: no STD exposure: no Discharge: no Irritants: no Rash: no  Red Flags   More than 3 UTI's last 12 months:  n PMH of  Diabetes or Immunosuppression:  no Renal Disease/Calculi: no Urinary Tract Abnormality:  no Instrumentation or Trauma: no    Past Medical History  Diagnosis Date  . Hypercholesterolemia   . Arthritis     Right Hip  . Hypothyroidism   . Chronic cystitis   . Dyspareunia   . Breast disorder      Rt Breast Calcifications  . Depression   . Anxiety   . Plantar fasciitis of left foot     2013   Past Surgical History  Procedure Laterality Date  . Scoliosis repair with harrington rods    . Breast enhancement surgery    . Plastic surgry to eyes     Family History  Problem Relation Age of Onset  . COPD    . Aneurysm    . Anxiety disorder Mother   . Depression Mother   . Alcohol abuse Father   . Heart murmur Father   . Drug abuse Brother   . Anxiety disorder Brother   . Depression Brother   . Dementia Maternal Grandfather    History  Substance Use Topics  . Smoking status: Former Smoker    Quit date: 09/20/1982  . Smokeless tobacco: Never Used  . Alcohol Use: 0.0 oz/week    0.5 drink(s) per week     Comment: Use is 1-2 a month.   OB History   Grav Para Term Preterm Abortions TAB SAB Ect Mult Living   0              Review of Systems  All other systems reviewed and are negative.    Allergies  Bupropion hcl and Sulfa antibiotics  Home Medications   Current Outpatient Rx  Name  Route  Sig  Dispense  Refill  . busPIRone  (BUSPAR) 15 MG tablet   Oral   Take 1 tablet (15 mg total) by mouth 2 (two) times daily.   180 tablet   1   . calcium-vitamin D (OSCAL WITH D) 500-200 MG-UNIT per tablet   Oral   Take 1 tablet by mouth daily.           . cephALEXin (KEFLEX) 500 MG capsule   Oral   Take 1 capsule (500 mg total) by mouth 3 (three) times daily.   21 capsule   0   . clonazePAM (KLONOPIN) 0.5 MG tablet      TAKE 1 TABLET BY MOUTH EVERY DAY AS NEEDED   90 tablet   0   . FLUoxetine (PROZAC) 40 MG capsule   Oral   Take 2 capsules (80 mg total) by mouth daily.   180 capsule   1     Discontinue previous prescription of fluoxetine.   Marland Kitchen HYDROcodone-acetaminophen (NORCO) 10-325 MG per tablet   Oral   Take 1 tablet by mouth 2 (two) times daily as needed.   60  tablet   0   . levothyroxine (SYNTHROID, LEVOTHROID) 75 MCG tablet      TAKE 1 TABLET (75 MCG TOTAL) BY MOUTH DAILY.   30 tablet   0     Needs f/u lab work   . metaxalone (SKELAXIN) 800 MG tablet   Oral   Take 1 tablet (800 mg total) by mouth 3 (three) times daily.   90 tablet   0   . Multiple Vitamin (MULTIVITAMIN) tablet   Oral   Take 1 tablet by mouth daily.           . nitrofurantoin (MACRODANTIN) 50 MG capsule   Oral   Take 1 capsule (50 mg total) by mouth daily as needed.   30 capsule   3   . simvastatin (ZOCOR) 40 MG tablet      TAKE 1 TABLET (40 MG TOTAL) BY MOUTH AT BEDTIME.   90 tablet   0    BP 118/77  Pulse 58  Temp(Src) 97.9 F (36.6 C) (Oral)  Ht 5\' 8"  (1.727 m)  Wt 163 lb (73.936 kg)  BMI 24.79 kg/m2  SpO2 98% Physical Exam  Constitutional: She appears well-developed and well-nourished.  HENT:  Head: Normocephalic and atraumatic.  Eyes: Conjunctivae are normal. Pupils are equal, round, and reactive to light.  Neck: Normal range of motion. Neck supple.  Cardiovascular: Normal rate and regular rhythm.   Pulmonary/Chest: Effort normal and breath sounds normal.  Abdominal: Soft. Bowel sounds are  normal.  No flank pain  + suprapubic tenderness   Musculoskeletal: Normal range of motion.  Neurological: She is alert.  Skin: Skin is warm and dry.    ED Course  Procedures (including critical care time) Labs Review Labs Reviewed  URINE CULTURE  POCT URINALYSIS DIP (MANUAL ENTRY)   Imaging Review No results found.    MDM   1. Dysuria    Will treat with keflex Urine culture Also refilled post coital macrobid Discussed infectious and GU red flags at length.  Follow up as needed.     The patient and/or caregiver has been counseled thoroughly with regard to treatment plan and/or medications prescribed including dosage, schedule, interactions, rationale for use, and possible side effects and they verbalize understanding. Diagnoses and expected course of recovery discussed and will return if not improved as expected or if the condition worsens. Patient and/or caregiver verbalized understanding.         Doree Albee, MD 11/23/13 [phone]

## 2013-11-25 LAB — URINE CULTURE: Colony Count: >=100000

## 2013-11-26 ENCOUNTER — Telehealth: Payer: Self-pay

## 2013-11-26 NOTE — ED Notes (Signed)
Left a message on voice mail asking how patient is feeling and advising to call back with any questions or concerns.  

## 2013-11-27 ENCOUNTER — Telehealth: Payer: Self-pay | Admitting: Emergency Medicine

## 2013-12-01 ENCOUNTER — Ambulatory Visit (INDEPENDENT_AMBULATORY_CARE_PROVIDER_SITE_OTHER): Payer: 59 | Admitting: Psychiatry

## 2013-12-01 ENCOUNTER — Encounter (HOSPITAL_COMMUNITY): Payer: Self-pay | Admitting: Psychiatry

## 2013-12-01 VITALS — BP 113/78 | HR 64 | Ht 68.0 in | Wt 165.5 lb

## 2013-12-01 DIAGNOSIS — F332 Major depressive disorder, recurrent severe without psychotic features: Secondary | ICD-10-CM

## 2013-12-01 DIAGNOSIS — F419 Anxiety disorder, unspecified: Secondary | ICD-10-CM

## 2013-12-01 DIAGNOSIS — F4321 Adjustment disorder with depressed mood: Secondary | ICD-10-CM

## 2013-12-01 NOTE — Progress Notes (Signed)
[facility] Follow-up Outpatient Visit  [name] Jul 16, 1956  Date: 12/01/2013  Chief Complaint:  Follow Up.  History of Chief Complaint:   HPI Comments: [name] is a 57 y/o female with a past psychiatric history significant for Major Depression, Recurrent severe. The patient is referred for psychiatric services for medication management.    . Location: She reports she is doing okay with some disappointment in herself due to "not doing thing I need to do like exercise."   . Quality: The patient reports she has had to use a quarter tablet of clonazepam 0.5 mg once in two weeks secondary with have to deal with a Bipolar Disorder.  She reports she is taking her medications and denies any side effects.  In the area of affective symptoms, patient appears euthymic. Patient denies current suicidal ideation, intent, or plan. Patient denies current homicidal ideation, intent, or plan. Patient denies auditory hallucinations. Patient denies visual hallucinations. Patient denies symptoms of paranoia. Patient states sleep is good, with approximately  8-10  hours of sleep per night.  She reports appetite is good. Energy level is fair to low. Patient denies symptoms of anhedonia. Patient denies hopelessness, helplessness, or guilt.   . Severity: Depression: 4/10 (0=Very depressed; 5=Neutral; 10=Very Happy)  Anxiety- 4/10 (0=no anxiety; 5= moderate/tolerable anxiety; 10= panic attacks)  . Duration: The patient notes depression since her early 3's; and worsened since 05-12-2007 with the death of her parents.  She states her depression has improved  . Timing: Mood is worse n the afternoon at 5 PM.  . Context: Not taking care of herself.  . Modifying factors: Mood improves with exercise.  . Associated signs and symptoms: Denies any recent episodes consistent with mania, particularly decreased need for sleep with increased energy, grandiosity, impulsivity, hyperverbal and pressured  speech, or increased productivity. Denies any recent symptoms consistent with psychosis, particularly auditory or visual hallucinations, thought broadcasting/insertion/withdrawal, or ideas of reference. Also denies excessive worry to the point of physical symptoms as well as any panic attacks. Denies any history of trauma or symptoms consistent with PTSD such as flashbacks, nightmares, hypervigilance, feelings of numbness or inability to connect with others.     Review of Systems  Constitutional: Negative for fever, chills, diaphoresis, activity change, fatigue and unexpected weight change.  Cardiovascular: Negative.  Gastrointestinal:  Negative for nausea, vomiting, abdominal pain, diarrhea, constipation, blood in stool, abdominal distention, anal bleeding and rectal pain.  Musculoskeletal: Patient denies muscle weakness, or gait weakness. Neurological:Negative for dizziness, tremors, seizures, syncope, facial asymmetry, speech difficulty, weakness, light-headedness, numbness and headaches.   Filed Vitals:   12/01/13 0907  BP: 113/78  Pulse: 64  Height: 5\' 8"  (1.727 m)  Weight: 165 lb 8 oz (75.07 kg)   Physical Exam  Vitals reviewed.  Constitutional: She appears well-developed and well-nourished. No distress.  Skin: She is not diaphoretic.   Musculoskeletal: Strength & Muscle Tone: within normal limits Gait & Station: normal Patient leans: N/A  Past Psychiatric History: Reviewed Diagnosis: MDD,   Hospitalizations: Patient denies   Outpatient Care: Counselors since the age 33, psychiatrists since 05-12-03   Substance Abuse Care: Patient denies, though admits to cocaine and marijuana use in the past   Self-Mutilation: Patient denies, does have OCD "picking" behavior.   Suicidal Attempts: Abient denies   Violent Behaviors: Patient has been verbally aggressive.    Past Medical History: Reviewed Past Medical History  Diagnosis Date  . Hypercholesterolemia   . Arthritis     Right  Hip  . Hypothyroidism   . Chronic cystitis   . Dyspareunia   . Breast disorder      Rt Breast Calcifications  . Depression   . Anxiety   . Plantar fasciitis of left foot     2013   History of Loss of Consciousness: No  Seizure History: No  Cardiac History: No   Allergies: Reviewed Allergies  Allergen Reactions  . Bupropion Hcl     REACTION: irritabillity  . Sulfa Antibiotics Itching   Current Medications: Reviewed Current Outpatient Prescriptions on File Prior to Visit  Medication Sig Dispense Refill  . busPIRone (BUSPAR) 15 MG tablet Take 1 tablet (15 mg total) by mouth 2 (two) times daily.  180 tablet  1  . calcium-vitamin D (OSCAL WITH D) 500-200 MG-UNIT per tablet Take 1 tablet by mouth daily.        . cephALEXin (KEFLEX) 500 MG capsule Take 1 capsule (500 mg total) by mouth 3 (three) times daily.  21 capsule  0  . clonazePAM (KLONOPIN) 0.5 MG tablet TAKE 1 TABLET BY MOUTH EVERY DAY AS NEEDED  90 tablet  0  . FLUoxetine (PROZAC) 40 MG capsule Take 2 capsules (80 mg total) by mouth daily.  180 capsule  1  . HYDROcodone-acetaminophen (NORCO) 10-325 MG per tablet Take 1 tablet by mouth 2 (two) times daily as needed.  60 tablet  0  . levothyroxine (SYNTHROID, LEVOTHROID) 75 MCG tablet TAKE 1 TABLET (75 MCG TOTAL) BY MOUTH DAILY.  30 tablet  0  . metaxalone (SKELAXIN) 800 MG tablet Take 1 tablet (800 mg total) by mouth 3 (three) times daily.  90 tablet  0  . Multiple Vitamin (MULTIVITAMIN) tablet Take 1 tablet by mouth daily.        . nitrofurantoin (MACRODANTIN) 50 MG capsule Take 1 capsule (50 mg total) by mouth daily as needed.  30 capsule  3  . simvastatin (ZOCOR) 40 MG tablet TAKE 1 TABLET (40 MG TOTAL) BY MOUTH AT BEDTIME.  90 tablet  0   No current facility-administered medications on file prior to visit.   Previous Psychotropic Medications: Reviewed Medication   Prozac   Clonazapam   Buspirone   Alprazolam.    Substance Abuse History in the last 12 months:  Reviewed History  Substance Use Topics  . Smoking status: Former Smoker    Quit date: 09/20/1982  . Smokeless tobacco: Never Used  . Alcohol Use: 0.0 oz/week    0.5 drink(s) per week     Comment: Use is 1-2 a month.  Caffeine: 2 cups a day  Illicit drugs: Patient admitts to cocaine and marijuana use in the 1980s.  Caffienated beverage: 4 cups.  Medical Consequences of Substance Abuse: N/A  Legal Consequences of Substance Abuse: N/A  Family Consequences of Substance Abuse: N/A  Blackouts: Yes  DT's: No  Withdrawal Symptoms: No None   Social History: Reviewed Current Place of Residence: [place], [place]  Place of Birth: [place], [place]  Family Members: The patient has 2 brothers, and 2 sister. The patient  Marital Status: Married-First marriage 22 years.  Children: None  Relationships: It was her mother, later her brother, now her older sister, (Ginger.)  Education: HS Graduate  Educational Problems/Performance: Dyslexia.  Religious Beliefs/Practices: None  History of Abuse: none  Occupational Experiences: She is currently working and enjoys her job. Military History: None.  Legal History: None  Hobbies/Interests:Scrapbook, watching house hunting shows, take pictures   Family History: Reviewed Family History  Problem  Relation  Age of Onset   .  COPD     .  Aneurysm     .  Anxiety disorder  Mother    .  Depression  Mother    .  Alcohol abuse  Father    .  Heart murmur  Father    .  Drug abuse  Brother    .  Anxiety disorder  Brother    .  Depression  Brother    .  Dementia  Maternal Grandfather     Psychiatric Specialty Exam:  Objective: Appearance: Casual and Well Groomed   Eye Contact:: Good   Speech: Clear and Coherent and Normal Rate   Volume: Normal   Mood: "A little discouraged"  Depression: 4/10 (0=Very depressed; 5=Neutral; 10=Very Happy)  Anxiety- 4/10 (0=no anxiety; 5= moderate/tolerable anxiety; 10= panic attacks)  Affect: Congruent and Full Range    Thought Process: Coherent, Logical and Loose   Orientation: Full   Thought Content: WDL   Suicidal Thoughts: No   Homicidal Thoughts: No   Judgement: Fair   Insight: Fair   Psychomotor Activity: Normal   Akathisia: No   Handed: Left   Memory: Immediate 3/3, recent: 2/3   Assets: Communication Skills  Desire for Improvement  Financial Resources/Insurance  Housing  Leisure Time  Physical Health  Resilience  Transportation  Vocational/Educational    Laboratory/X-Ray  Psychological Evaluation(s)   NONE  NONE    Assessment:  AXIS I  Major Depression, Recurrent severe   AXIS II  No diagnosis   AXIS III  Past Medical History    Diagnosis  Date    .  Hypercholesterolemia     .  Arthritis       Right Hip    .  Hypothyroidism     .  Chronic cystitis     .  Decreased libido     .  Dyspareunia     .  Breast disorder       Rt Breast Calcifications    .  Depression     .  Anxiety     .  Plantar fasciitis of left foot       2013      AXIS IV  other psychosocial or environmental problems   AXIS V  51-60 moderate symptoms    Treatment Plan/Recommendations:   Plan of Care:  PLAN:  1. Affirm with the patient that the medications are taken as ordered. Patient  expressed understanding of how their medications were to be used.    Laboratory:  No labs warranted at this time.    Psychotherapy: Therapy: brief supportive therapy provided.  Discussed psychosocial stressors in detail.   Medications:  Continue  the following psychiatric medications as written prior to this appointment with the following changes::  a) Prozac 80 mg.  b) Increased Buspirone 15 mg-3 tablet in the morning and one tablet at bedtime. c) Asked patient to limit and try to discontinue clonazepam. -Risks and benefits, side effects and alternatives discussed with patient, she was given an opportunity to ask questions about her medication, illness, and treatment. All current psychiatric medications have been  reviewed and discussed with the patient and adjusted as clinically appropriate. The patient has been provided an accurate and updated list of the medications being now prescribed.   Routine PRN Medications:  Negative  Consultations: The patient was encouraged to keep all PCP and specialty clinic appointments.   Safety Concerns:   Patient told to  call clinic if any problems occur. Patient advised to go to  ER  if she should develop SI/HI, side effects, or if symptoms worsen. Has crisis numbers to call if needed.    Other:   8. Patient was instructed to return to clinic in 3 months.  9. The patient was advised to call and cancel their mental health appointment within 24 hours of the appointment, if they are unable to keep the appointment, as well as the three no show and termination from clinic policy. 10. The patient expressed understanding of the plan and agrees with the above.  Time Spent: 30 minutes  Jacqulyn Cane, M.D.  12/01/2013 9:05 AM

## 2013-12-06 ENCOUNTER — Telehealth (HOSPITAL_COMMUNITY): Payer: Self-pay

## 2013-12-08 NOTE — Telephone Encounter (Signed)
Called patient. She reports that she has not had any SI/HI/AVH.  She has one episode of a short crying spell and recognizes that this time of year worsens her mood.   PLAN:  Continue current dose of medications. Will consider another trial of wellbutrin.

## 2013-12-17 ENCOUNTER — Other Ambulatory Visit: Payer: Self-pay | Admitting: Family Medicine

## 2014-02-02 ENCOUNTER — Ambulatory Visit (HOSPITAL_COMMUNITY): Payer: Self-pay | Admitting: Psychiatry

## 2014-02-09 ENCOUNTER — Emergency Department (INDEPENDENT_AMBULATORY_CARE_PROVIDER_SITE_OTHER)
Admission: EM | Admit: 2014-02-09 | Discharge: 2014-02-09 | Disposition: A | Discharge status: Home / Self Care | Payer: 59 | Source: Home / Self Care | Attending: Family Medicine | Admitting: Family Medicine

## 2014-02-09 ENCOUNTER — Encounter: Payer: Self-pay | Admitting: Emergency Medicine

## 2014-02-09 DIAGNOSIS — L282 Other prurigo: Secondary | ICD-10-CM

## 2014-02-09 MED ORDER — CLOBETASOL PROPIONATE 0.05 % EX OINT: 1.0000 "application " | TOPICAL_OINTMENT | Freq: Two times a day (BID) | CUTANEOUS | Status: DC

## 2014-02-09 MED ORDER — PREDNISONE 50 MG PO TABS: ORAL_TABLET | ORAL | Status: DC

## 2014-02-09 NOTE — ED Provider Notes (Signed)
CSN: [id number]     Arrival date & time 02/09/14  1443 History   First MD Initiated Contact with Patient 02/09/14 1449     Chief Complaint  Patient presents with  . Rash      HPI  HPI  This patient complains of a RASH  Location: anterior chest/neck  Onset: 3-4 days  Course: has had intermittent rash of similar distribution over past 4-5 months. Usually self resolves within 1-2 days. Has now persisted over past 4 days.   Self-treated with: neosporin, anti itch cream   Improvement with treatment: no  History  Itching: yes  Tenderness: no  New medications/antibiotics: no  Pet exposure: no  Recent travel or tropical exposure: no  New soaps, shampoos, detergent, clothing: no  Tick/insect exposure: no  Chemical Exposure: no  Red Flags  Feeling ill: no  Fever: no  Facial/tongue swelling/difficulty breathing: no  Diabetic or immunocompromised: no    Past Medical History  Diagnosis Date  . Hypercholesterolemia   . Arthritis     Right Hip  . Hypothyroidism   . Chronic cystitis   . Dyspareunia   . Breast disorder      Rt Breast Calcifications  . Depression   . Anxiety   . Plantar fasciitis of left foot     2013   Past Surgical History  Procedure Laterality Date  . Scoliosis repair with harrington rods    . Breast enhancement surgery    . Plastic surgry to eyes     Family History  Problem Relation Age of Onset  . COPD    . Aneurysm    . Anxiety disorder Mother   . Depression Mother   . Alcohol abuse Father   . Heart murmur Father   . Drug abuse Brother   . Anxiety disorder Brother   . Depression Brother   . Dementia Maternal Grandfather    History  Substance Use Topics  . Smoking status: Former Smoker    Quit date: 09/20/1982  . Smokeless tobacco: Never Used  . Alcohol Use: 0.0 oz/week    0.5 drink(s) per week     Comment: Use is 1-2 a month.   OB History   Grav Para Term Preterm Abortions TAB SAB Ect Mult Living   0              Review of Systems   All other systems reviewed and are negative.      Allergies  Bupropion hcl and Sulfa antibiotics  Home Medications   Current Outpatient Rx  Name  Route  Sig  Dispense  Refill  . busPIRone (BUSPAR) 15 MG tablet   Oral   Take 30 mg by mouth as directed. 3 tablet in the morning and one tablet at bedtime.         . calcium-vitamin D (OSCAL WITH D) 500-200 MG-UNIT per tablet   Oral   Take 1 tablet by mouth daily.           . cephALEXin (KEFLEX) 500 MG capsule   Oral   Take 1 capsule (500 mg total) by mouth 3 (three) times daily.   21 capsule   0   . clonazePAM (KLONOPIN) 0.5 MG tablet      TAKE 1 TABLET BY MOUTH EVERY DAY AS NEEDED   90 tablet   0   . FLUoxetine (PROZAC) 40 MG capsule   Oral   Take 2 capsules (80 mg total) by mouth daily.   180 capsule  1     Discontinue previous prescription of fluoxetine.   Marland Kitchen. HYDROcodone-acetaminophen (NORCO) 10-325 MG per tablet   Oral   Take 1 tablet by mouth 2 (two) times daily as needed.   60 tablet   0   . levothyroxine (SYNTHROID, LEVOTHROID) 75 MCG tablet      TAKE 1 TABLET (75 MCG TOTAL) BY MOUTH DAILY.   30 tablet   3   . metaxalone (SKELAXIN) 800 MG tablet   Oral   Take 1 tablet (800 mg total) by mouth 3 (three) times daily.   90 tablet   0   . Multiple Vitamin (MULTIVITAMIN) tablet   Oral   Take 1 tablet by mouth daily.           . nitrofurantoin (MACRODANTIN) 50 MG capsule   Oral   Take 1 capsule (50 mg total) by mouth daily as needed.   30 capsule   3   . simvastatin (ZOCOR) 40 MG tablet      TAKE 1 TABLET (40 MG TOTAL) BY MOUTH AT BEDTIME.   90 tablet   3    BP 95/64  Pulse 79  Temp(Src) 98 F (36.7 C) (Oral)  Resp 16  Ht 5' 8.5" (1.74 m)  Wt 158 lb (71.668 kg)  BMI 23.67 kg/m2  SpO2 98% Physical Exam  Constitutional: She appears well-developed and well-nourished.  HENT:  Head: Normocephalic and atraumatic.  Eyes: Conjunctivae are normal. Pupils are equal, round, and reactive  to light.  Neck: Normal range of motion.  Cardiovascular: Normal rate.   Pulmonary/Chest: Effort normal and breath sounds normal.  Abdominal: Soft.  Musculoskeletal: Normal range of motion.  Neurological: She is alert.  Skin: Rash noted.     Diffuse erythematous lesions with faint scaling      ED Course  Procedures (including critical care time) Labs Review Labs Reviewed - No data to display Imaging Review No results found.    MDM   Final diagnoses:  Pruritic rash    Pruritic rash of unknown etiology  Skin KOH scraping negative for fungi.  Will place on topical steroid.  Oral steroid if minimal response.  Discussed general, systemic and derm red flags.  Derm referral if sxs persist despite treatment.  Follow up as needed.     The patient and/or caregiver has been counseled thoroughly with regard to treatment plan and/or medications prescribed including dosage, schedule, interactions, rationale for use, and possible side effects and they verbalize understanding. Diagnoses and expected course of recovery discussed and will return if not improved as expected or if the condition worsens. Patient and/or caregiver verbalized understanding.         Doree AlbeeSteven Onur Mori, MD 02/09/14 1520

## 2014-02-09 NOTE — ED Notes (Signed)
Pt c/o itching rash on chest x 4 days. She reports that she has had a similar rash intermittently for the past few months. She has applied neosporin and anti itch cream with some relief.

## 2014-03-08 ENCOUNTER — Telehealth (HOSPITAL_COMMUNITY): Payer: Self-pay

## 2014-03-08 DIAGNOSIS — F4321 Adjustment disorder with depressed mood: Secondary | ICD-10-CM

## 2014-03-08 DIAGNOSIS — F419 Anxiety disorder, unspecified: Secondary | ICD-10-CM

## 2014-03-09 NOTE — Telephone Encounter (Signed)
Patient rescheduled for next Friday.

## 2014-03-12 ENCOUNTER — Ambulatory Visit (HOSPITAL_COMMUNITY): Payer: Self-pay | Admitting: Psychiatry

## 2014-03-15 ENCOUNTER — Other Ambulatory Visit: Payer: Self-pay | Admitting: *Deleted

## 2014-03-16 ENCOUNTER — Encounter (HOSPITAL_COMMUNITY): Payer: Self-pay | Admitting: Psychiatry

## 2014-03-16 ENCOUNTER — Ambulatory Visit (INDEPENDENT_AMBULATORY_CARE_PROVIDER_SITE_OTHER): Payer: 59 | Admitting: Psychiatry

## 2014-03-16 ENCOUNTER — Encounter (INDEPENDENT_AMBULATORY_CARE_PROVIDER_SITE_OTHER): Payer: Self-pay

## 2014-03-16 VITALS — BP 128/85 | HR 58 | Wt 156.0 lb

## 2014-03-16 DIAGNOSIS — F419 Anxiety disorder, unspecified: Secondary | ICD-10-CM

## 2014-03-16 DIAGNOSIS — F332 Major depressive disorder, recurrent severe without psychotic features: Secondary | ICD-10-CM

## 2014-03-16 DIAGNOSIS — F4321 Adjustment disorder with depressed mood: Secondary | ICD-10-CM

## 2014-03-16 MED ORDER — BUSPIRONE HCL 15 MG PO TABS: 15.0000 mg | ORAL_TABLET | ORAL | Status: DC

## 2014-03-16 MED ORDER — FLUOXETINE HCL 40 MG PO CAPS: 80.0000 mg | ORAL_CAPSULE | Freq: Every day | ORAL | Status: DC

## 2014-03-16 NOTE — Progress Notes (Signed)
[facility] Follow-up Outpatient Visit  [name] [birth year]  Date: 03/16/2014  Chief Complaint:  Follow Up.  History of Chief Complaint:   HPI Comments: Ms. Lusty is a 58 y/o female with a past psychiatric history significant for Major Depression, Recurrent severe. The patient is referred for psychiatric services for medication management.    . Location: She has reported shortness of breath on exertion. 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  . Quality: The patient reports she has she has .  She reports she is taking her medications and denies any side effects.  In the area of affective symptoms, patient appears euthymic. Patient denies current suicidal ideation, intent, or plan. Patient denies current homicidal ideation, intent, or plan. Patient denies auditory hallucinations. Patient denies visual hallucinations. Patient denies symptoms of paranoia. Patient states sleep is good, with approximately 10  hours of sleep per night.  She reports appetite is good. Energy level is pretty good.  Patient denies symptoms of anhedonia. Patient denies hopelessness, helplessness, or guilt.   . Severity: Depression: 5-7/10 (0=Very depressed; 5=Neutral; 10=Very Happy)  Anxiety- 0-1/10 (0=no anxiety; 5= moderate/tolerable anxiety; 10= panic attacks)  . Duration: The patient notes depression since her early 35's; and worsened since 05-03-07 with the death of her parents.  She states her depression has improved  . Timing: Mood is worse n the afternoon at 5 PM.  . Context: Being late. Being afraid she will "screw up at work."  . Modifying factors: Mood improves with exercise.  . Associated signs and symptoms: Denies any recent episodes consistent with mania, particularly decreased need for sleep with increased energy, grandiosity, impulsivity, hyperverbal and pressured speech, or increased productivity. Denies any recent symptoms  consistent with psychosis, particularly auditory or visual hallucinations, thought broadcasting/insertion/withdrawal, or ideas of reference. Also denies excessive worry to the point of physical symptoms as well as any panic attacks. Denies any history of trauma or symptoms consistent with PTSD such as flashbacks, nightmares, hypervigilance, feelings of numbness or inability to connect with others.     Review of Systems  Constitutional: Negative for fever, chills and malaise/fatigue.  Eyes: Negative for blurred vision, double vision, photophobia and pain.  Respiratory: Positive for shortness of breath. Negative for cough, hemoptysis and sputum production.   Cardiovascular: Negative for chest pain, palpitations, orthopnea and claudication.  Gastrointestinal: Negative for heartburn, nausea, vomiting, abdominal pain and diarrhea.  Skin: Negative for itching and rash.  Neurological: Negative for dizziness, tingling and tremors.     Filed Vitals:   03/16/14 1503  BP: 128/85  Pulse: 58  Weight: 156 lb (70.761 kg)   Physical Exam  Vitals reviewed.  Constitutional: She appears well-developed and well-nourished. No distress.  Skin: She is not diaphoretic.   Musculoskeletal: Strength & Muscle Tone: within normal limits Gait & Station: normal Patient leans: N/A  Past Psychiatric History: Reviewed Diagnosis: MDD,   Hospitalizations: Patient denies   Outpatient Care: Counselors since the age 73, psychiatrists since 05-03-2003   Substance Abuse Care: Patient denies, though admits to cocaine and marijuana use in the past   Self-Mutilation: Patient denies, does have OCD "picking" behavior.   Suicidal Attempts: Abient denies   Violent Behaviors: Patient has been verbally aggressive.    Past Medical History: Reviewed Past Medical History  Diagnosis Date  . Hypercholesterolemia   . Arthritis     Right Hip  . Hypothyroidism   . Chronic cystitis   . Dyspareunia   .  Breast disorder      Rt  Breast Calcifications  . Depression   . Anxiety   . Plantar fasciitis of left foot     2013   History of Loss of Consciousness: No  Seizure History: No  Cardiac History: No   Allergies: Reviewed Allergies  Allergen Reactions  . Bupropion Hcl     REACTION: irritabillity  . Sulfa Antibiotics Itching   Current Medications: Reviewed Current Outpatient Prescriptions on File Prior to Visit  Medication Sig Dispense Refill  . busPIRone (BUSPAR) 15 MG tablet Take 30 mg by mouth as directed. 3 tablet in the morning and one tablet at bedtime.      . calcium-vitamin D (OSCAL WITH D) 500-200 MG-UNIT per tablet Take 1 tablet by mouth daily.        . cephALEXin (KEFLEX) 500 MG capsule Take 1 capsule (500 mg total) by mouth 3 (three) times daily.  21 capsule  0  . clobetasol ointment (TEMOVATE) 0.05 % Apply 1 application topically 2 (two) times daily.  30 g  0  . clonazePAM (KLONOPIN) 0.5 MG tablet TAKE 1 TABLET BY MOUTH EVERY DAY AS NEEDED  90 tablet  0  . FLUoxetine (PROZAC) 40 MG capsule Take 2 capsules (80 mg total) by mouth daily.  180 capsule  1  . levothyroxine (SYNTHROID, LEVOTHROID) 75 MCG tablet TAKE 1 TABLET (75 MCG TOTAL) BY MOUTH DAILY.  30 tablet  3  . metaxalone (SKELAXIN) 800 MG tablet Take 1 tablet (800 mg total) by mouth 3 (three) times daily.  90 tablet  0  . Multiple Vitamin (MULTIVITAMIN) tablet Take 1 tablet by mouth daily.        . nitrofurantoin (MACRODANTIN) 50 MG capsule Take 1 capsule (50 mg total) by mouth daily as needed.  30 capsule  3  . predniSONE (DELTASONE) 50 MG tablet 1 tab daily x 5 days  5 tablet  0  . simvastatin (ZOCOR) 40 MG tablet TAKE 1 TABLET (40 MG TOTAL) BY MOUTH AT BEDTIME.  90 tablet  3   No current facility-administered medications on file prior to visit.   Previous Psychotropic Medications: Reviewed Medication   Prozac   Clonazapam   Buspirone   Alprazolam.    Substance Abuse History in the last 12 months: Reviewed History  Substance Use  Topics  . Smoking status: Former Smoker    Quit date: 09/20/1982  . Smokeless tobacco: Never Used  . Alcohol Use: 0.0 oz/week    0.5 drink(s) per week     Comment: Use is 1-2 a month.  Caffeine: 2 cups a day  Illicit drugs: Patient admitts to cocaine and marijuana use in the 1980s.  Caffienated beverage: 4 cups.  Medical Consequences of Substance Abuse: N/A  Legal Consequences of Substance Abuse: N/A  Family Consequences of Substance Abuse: N/A  Blackouts: Yes  DT's: No  Withdrawal Symptoms: No None   Social History: Reviewed Current Place of Residence: [place], [place]  Place of Birth: [place], [place]  Family Members: The patient has 2 brothers, and 2 sister. The patient  Marital Status: Married-First marriage 22 years.  Children: None  Relationships: It was her mother, later her brother, now her older sister, (Ginger.)  Education: HS Graduate  Educational Problems/Performance: Dyslexia.  Religious Beliefs/Practices: None  History of Abuse: none  Occupational Experiences: She is currently working and enjoys her job. Military History: None.  Legal History: None  Hobbies/Interests:Scrapbook, watching house hunting shows, take pictures   Family History: Reviewed Family  History   Problem  Relation  Age of Onset   .  COPD     .  Aneurysm     .  Anxiety disorder  Mother    .  Depression  Mother    .  Alcohol abuse  Father    .  Heart murmur  Father    .  Drug abuse  Brother    .  Anxiety disorder  Brother    .  Depression  Brother    .  Dementia  Maternal Grandfather     Psychiatric Specialty Exam:  Objective: Appearance: Casual and Well Groomed   Eye Contact:: Good   Speech: Clear and Coherent and Normal Rate   Volume: Normal   Mood: "It isn't as good as it should be giving all the postives"    Affect: Congruent and Full Range   Thought Process: Coherent, Logical and Loose   Orientation: Full   Thought Content: WDL   Suicidal Thoughts: No   Homicidal Thoughts: No    Judgement: Fair   Insight: Fair   Psychomotor Activity: Normal   Akathisia: No   Handed: Left   Memory: Immediate 3/3, recent: 1/3   Assets: Communication Skills  Desire for Improvement  Financial Resources/Insurance  Housing  Leisure Time  Physical Health  Resilience  Transportation  Vocational/Educational    Laboratory/X-Ray  Psychological Evaluation(s)   NONE  NONE    Assessment:  AXIS I  Major Depression, Recurrent severe-stable and improving  AXIS II  No diagnosis   AXIS III  Past Medical History    Diagnosis  Date    .  Hypercholesterolemia     .  Arthritis       Right Hip    .  Hypothyroidism     .  Chronic cystitis     .  Decreased libido     .  Dyspareunia     .  Breast disorder       Rt Breast Calcifications    .  Depression     .  Anxiety     .  Plantar fasciitis of left foot       2013      AXIS IV  other psychosocial or environmental problems   AXIS V  64   Treatment Plan/Recommendations:   Plan of Care:  PLAN:  1. Affirm with the patient that the medications are taken as ordered. Patient  expressed understanding of how their medications were to be used.    Laboratory:  No labs warranted at this time.    Psychotherapy: Therapy: brief supportive therapy provided.  Discussed psychosocial stressors in detail. More than 50% of the visit was spent on individual therapy/counseling.   Medications:  Continue  the following psychiatric medications as written prior to this appointment with the following changes::  a) Prozac 80 mg.  b) Increased Buspirone 15 mg-3 tablet in the morning and one tablet at bedtime. c) Asked patient to limit and try to discontinue clonazepam. -Risks and benefits, side effects and alternatives discussed with patient, she was given an opportunity to ask questions about her medication, illness, and treatment. All current psychiatric medications have been reviewed and discussed with the patient and adjusted as clinically  appropriate. The patient has been provided an accurate and updated list of the medications being now prescribed.   Routine PRN Medications:  Negative  Consultations: The patient was encouraged to keep all PCP and specialty clinic appointments. Advised her to go to  PCP for exertional dyspnea.  Safety Concerns:   Patient told to call clinic if any problems occur. Patient advised to go to  ER  if she should develop SI/HI, side effects, or if symptoms worsen. Has crisis numbers to call if needed.    Other:   8. Patient was instructed to return to clinic in 3 months.  9. The patient was advised to call and cancel their mental health appointment within 24 hours of the appointment, if they are unable to keep the appointment, as well as the three no show and termination from clinic policy. 10. The patient expressed understanding of the plan and agrees with the above. 11. Patient informed that April 15th, 2015 would be my last day at this clinic.   Time Spent: 30 minutes  Jacqulyn Cane, M.D.  03/16/2014 2:58 PM

## 2014-03-16 NOTE — Telephone Encounter (Signed)
Pt will need an appt for future refills.Loralee PacasBarkley, Brittlyn Cloe BelmarLynetta

## 2014-03-19 ENCOUNTER — Encounter: Payer: Self-pay | Admitting: Family Medicine

## 2014-03-19 ENCOUNTER — Ambulatory Visit (INDEPENDENT_AMBULATORY_CARE_PROVIDER_SITE_OTHER): Payer: 59

## 2014-03-19 ENCOUNTER — Ambulatory Visit (INDEPENDENT_AMBULATORY_CARE_PROVIDER_SITE_OTHER): Payer: 59 | Admitting: Family Medicine

## 2014-03-19 VITALS — BP 119/83 | HR 122 | Wt 156.0 lb

## 2014-03-19 DIAGNOSIS — J309 Allergic rhinitis, unspecified: Secondary | ICD-10-CM

## 2014-03-19 DIAGNOSIS — R0602 Shortness of breath: Secondary | ICD-10-CM

## 2014-03-19 NOTE — Progress Notes (Signed)
Subjective:    Patient ID: [name], female    DOB: 09-21-1956, 58 y.o.   MRN: [id number]  HPI Started a walking program with moderate intensity. Says she just feels SOB when she gets 10-15 minutes. It is severe enough that she actually has to stop walking. She says that they're women who are heavier than her older than her to have more endurance than she does. Former smoker but quit 20 years ago. Parents smoked, and she was around that as a child..  No other enviromental exposure in the work place such as dust or glass. Feels like can't get enough air in.  Denies wheezing or childhood asthma. No cough.  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 This was about 2 weeks ago.  Does have cats at home.  Constantly runy nose with midl congestion that is chronic.    Review of Systems  BP 119/83  Pulse 122  Wt 156 lb (70.761 kg)  SpO2 98%  PF 290 L/min    Allergies  Allergen Reactions  . Bupropion Hcl     REACTION: irritabillity  . Sulfa Antibiotics Itching    Past Medical History  Diagnosis Date  . Hypercholesterolemia   . Arthritis     Right Hip  . Hypothyroidism   . Chronic cystitis   . Dyspareunia   . Breast disorder      Rt Breast Calcifications  . Depression   . Anxiety   . Plantar fasciitis of left foot     2013    Past Surgical History  Procedure Laterality Date  . Scoliosis repair with harrington rods    . Breast enhancement surgery    . Plastic surgry to eyes      History   Social History  . Marital Status: Married    Spouse Name: N/A    Number of Children: N/A  . Years of Education: N/A   Occupational History  . Not on file.   Social History Main Topics  . Smoking status: Former Smoker    Quit date: 09/20/1982  . Smokeless tobacco: Never Used  . Alcohol Use: 0.0 oz/week    0.5 drink(s) per week     Comment: Use is 1-2 a month.  . Drug Use: No  . Sexual Activity:  Yes    Partners: Male   Other Topics Concern  . Not on file   Social History Narrative  . No narrative on file    Family History  Problem Relation Age of Onset  . COPD    . Aneurysm    . Anxiety disorder Mother   . Depression Mother   . Alcohol abuse Father   . Heart murmur Father   . Drug abuse Brother   . Anxiety disorder Brother   . Depression Brother   . Dementia Maternal Grandfather     Outpatient Encounter Prescriptions as of 03/19/2014  Medication Sig  . busPIRone (BUSPAR) 15 MG tablet Take 1 tablet (15 mg total) by mouth as directed. 3 tablet in the morning and one tablet at  [facility]  . calcium-vitamin D (OSCAL WITH D) 500-200 MG-UNIT per tablet Take 1 tablet by mouth daily.    . cephALEXin (KEFLEX) 500 MG capsule Take 1 capsule (500 mg total) by mouth 3 (three) times daily.  . clonazePAM (KLONOPIN) 0.5 MG tablet TAKE 1 TABLET BY MOUTH EVERY DAY AS NEEDED  .  FLUoxetine (PROZAC) 40 MG capsule Take 2 capsules (80 mg total) by mouth daily.  Marland Kitchen. levothyroxine (SYNTHROID, LEVOTHROID) 75 MCG tablet TAKE 1 TABLET (75 MCG TOTAL) BY MOUTH DAILY.  . metaxalone (SKELAXIN) 800 MG tablet Take 1 tablet (800 mg total) by mouth 3 (three) times daily.  . Multiple Vitamin (MULTIVITAMIN) tablet Take 1 tablet by mouth daily.    . nitrofurantoin (MACRODANTIN) 50 MG capsule Take 1 capsule (50 mg total) by mouth daily as needed.  . simvastatin (ZOCOR) 40 MG tablet TAKE 1 TABLET (40 MG TOTAL) BY MOUTH AT BEDTIME.          Objective:   Physical Exam  Constitutional: She is oriented to person, place, and time. She appears well-developed and well-nourished.  HENT:  Head: Normocephalic and atraumatic.  Cardiovascular: Normal rate, regular rhythm and normal heart sounds.   Pulmonary/Chest: Effort normal and breath sounds normal.  Neurological: She is alert and oriented to person, place, and time.  Skin: Skin is warm and dry.  Psychiatric: She has a normal mood and affect. Her behavior is  normal.          Assessment & Plan:  SOB with exercise. - Lung exam today is normal. will get CXR and perform spirometry and consider allergy testing.  Did do a peak flow today and she was in the yellow zone. Will followup with full spirometry testing here in the office. Will evaluate for COPD versus asthma.  AR - Discussed recommend allergy testing. Seh may even has allergy to cats which she has in the home.

## 2014-03-20 LAB — ALLERGY PROFILE REGION II-DC, DE, MD, ~~LOC~~, VA
Allergen, D pternoyssinus,d7: <0.1 kU/L
Alternaria Alternata: <0.1 kU/L
Aspergillus fumigatus, m3: <0.1 kU/L
Bermuda Grass: 0.16 kU/L — ABNORMAL HIGH
Box Elder IgE: <0.1 kU/L
CAT DANDER: 0.23 kU/L — AB
Cladosporium Herbarum: <0.1 kU/L
Cockroach: <0.1 kU/L
D. farinae: <0.1 kU/L
Dog Dander: <0.1 kU/L
IgE (Immunoglobulin E), Serum: 9 IU/mL (ref 0.0–180.0)
MEADOW GRASS: 0.55 kU/L — AB
Pecan/Hickory Tree IgE: <0.1 kU/L

## 2014-03-28 ENCOUNTER — Other Ambulatory Visit: Payer: Self-pay | Admitting: *Deleted

## 2014-03-30 ENCOUNTER — Ambulatory Visit (INDEPENDENT_AMBULATORY_CARE_PROVIDER_SITE_OTHER): Payer: 59 | Admitting: Family Medicine

## 2014-03-30 ENCOUNTER — Encounter: Payer: Self-pay | Admitting: Family Medicine

## 2014-03-30 VITALS — BP 98/70 | HR 67 | Wt 156.0 lb

## 2014-03-30 DIAGNOSIS — R0602 Shortness of breath: Secondary | ICD-10-CM

## 2014-03-30 DIAGNOSIS — J449 Chronic obstructive pulmonary disease, unspecified: Secondary | ICD-10-CM

## 2014-03-30 MED ORDER — ALBUTEROL SULFATE (2.5 MG/3ML) 0.083% IN NEBU
2.5000 mg | INHALATION_SOLUTION | Freq: Once | RESPIRATORY_TRACT | Status: AC
  Administered 2014-03-30: 2.5 mg via RESPIRATORY_TRACT

## 2014-03-30 MED ORDER — VALACYCLOVIR HCL 500 MG PO TABS: 500.0000 mg | ORAL_TABLET | Freq: Two times a day (BID) | ORAL | Status: DC

## 2014-03-30 MED ORDER — ALBUTEROL SULFATE HFA 108 (90 BASE) MCG/ACT IN AERS: 2.0000 | INHALATION_SPRAY | Freq: Four times a day (QID) | RESPIRATORY_TRACT | Status: DC | PRN

## 2014-03-30 NOTE — Progress Notes (Signed)
   Subjective:    Patient ID: [name], female    DOB: May 08, 1956, 58 y.o.   MRN: [id number]  HPI Information from prior note: "Started a walking program with moderate intensity. Says she just feels SOB when she gets 10-15 minutes. It is severe enough that she actually has to stop walking. She says that they're women who are heavier than her older than her to have more endurance than she does. Former smoker but quit 20 years ago. Parents smoked, and she was around that as a child.. No other enviromental exposure in the work place such as dust or glass. Feels like can't get enough air in. Denies wheezing or childhood asthma. No cough. 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 This was about 2 weeks ago. Does have cats at home. Constantly runy nose with midl congestion that is chronic".   Here for spirometry today for further evaluation.   Review of Systems     Objective:   Physical Exam  Constitutional: She is oriented to person, place, and time. She appears well-developed and well-nourished.  HENT:  Head: Normocephalic and atraumatic.  Eyes: Conjunctivae and EOM are normal.  Cardiovascular: Normal rate.   Pulmonary/Chest: Effort normal.  Neurological: She is alert and oriented to person, place, and time.  Skin: Skin is dry. No pallor.  Psychiatric: She has a normal mood and affect. Her behavior is normal.          Assessment & Plan:  Shortness of breath-spirometry shows a ratio of 69%. FEV1 is 98% and FEC of it is 111%. This is consistent with mild COPD. Interestingly she did have significant improvement in small airways, FEF 25-75% after albuterol treatment. She had improvement of 25%. I think she would benefit greatly from albuterol inhaler preexercise period was in a prescription for PROAIR. Reviewed with her correct technique on using the inhaler. Recommend 2 puffs 10-15 minutes before exercise as  needed. She typically walks 2-3 times per week. Call and let me know if she doesn't feel like this is helping.

## 2014-04-02 ENCOUNTER — Other Ambulatory Visit: Payer: Self-pay | Admitting: *Deleted

## 2014-04-02 MED ORDER — LEVOTHYROXINE SODIUM 75 MCG PO TABS: ORAL_TABLET | ORAL | Status: DC

## 2014-04-11 ENCOUNTER — Other Ambulatory Visit (HOSPITAL_COMMUNITY): Payer: Self-pay | Admitting: *Deleted

## 2014-04-11 DIAGNOSIS — F411 Generalized anxiety disorder: Secondary | ICD-10-CM

## 2014-04-11 MED ORDER — BUSPIRONE HCL 15 MG PO TABS: 15.0000 mg | ORAL_TABLET | ORAL | Status: DC

## 2014-04-11 NOTE — Telephone Encounter (Signed)
Recived fax requesting 90 day supply for Buspar patient. Authorized by Dr. Donell BeersPlovsky (in Dr. Darl HouseholderPuthuvel's absence)

## 2014-07-26 ENCOUNTER — Emergency Department (INDEPENDENT_AMBULATORY_CARE_PROVIDER_SITE_OTHER)
Admission: EM | Admit: 2014-07-26 | Discharge: 2014-07-26 | Disposition: A | Discharge status: Home / Self Care | Payer: 59 | Source: Home / Self Care | Attending: Emergency Medicine | Admitting: Emergency Medicine

## 2014-07-26 ENCOUNTER — Encounter: Payer: Self-pay | Admitting: Emergency Medicine

## 2014-07-26 ENCOUNTER — Emergency Department (INDEPENDENT_AMBULATORY_CARE_PROVIDER_SITE_OTHER): Payer: 59

## 2014-07-26 DIAGNOSIS — S92309A Fracture of unspecified metatarsal bone(s), unspecified foot, initial encounter for closed fracture: Secondary | ICD-10-CM

## 2014-07-26 DIAGNOSIS — S92355A Nondisplaced fracture of fifth metatarsal bone, left foot, initial encounter for closed fracture: Secondary | ICD-10-CM

## 2014-07-26 DIAGNOSIS — X58XXXA Exposure to other specified factors, initial encounter: Secondary | ICD-10-CM

## 2014-07-26 NOTE — ED Notes (Signed)
Left foot injury yesterday fell getting out of bed

## 2014-07-26 NOTE — ED Provider Notes (Signed)
CSN: [id number]     Arrival date & time 07/26/14  1127 History   First MD Initiated Contact with Patient 07/26/14 1139     Chief Complaint  Patient presents with  . Foot Injury   (Consider location/radiation/quality/duration/timing/severity/associated sxs/prior Treatment) HPI Yesterday morning, she was getting out of bed, and accidentally twisted and injured left lateral foot. Progressive worsening pain and swelling left lateral foot. Severe pain with weightbearing which makes her worse. Pain is sharp, 6/10 at rest. 8/10 when trying to weight-bear. She tried ibuprofen which helped a little. She took a hydrocodone which helped. She states that she is prescribed hydrocodone by her physician for scoliosis, and averages about one hydrocodone a month when necessary severe back pain. Associated symptoms: Denies numbness or tingling . No chest pain or nausea or vomiting or abdominal or GI symptoms Past Medical History  Diagnosis Date  . Hypercholesterolemia   . Arthritis     Right Hip  . Hypothyroidism   . Chronic cystitis   . Dyspareunia   . Breast disorder      Rt Breast Calcifications  . Depression   . Anxiety   . Plantar fasciitis of left foot     2013   Past Surgical History  Procedure Laterality Date  . Scoliosis repair with harrington rods    . Breast enhancement surgery    . Plastic surgry to eyes     Family History  Problem Relation Age of Onset  . COPD Father     smoker  . Aneurysm    . Anxiety disorder Mother   . Depression Mother   . Alcohol abuse Father   . Heart murmur Father   . Drug abuse Brother   . Anxiety disorder Brother   . Depression Brother   . Dementia Maternal Grandfather    History  Substance Use Topics  . Smoking status: Former Smoker    Quit date: 09/20/1982  . Smokeless tobacco: Never Used  . Alcohol Use: 0.0 oz/week    0.5 drink(s) per week     Comment: Use is 1-2 a month.   OB History   Grav Para Term Preterm Abortions TAB SAB Ect Mult  Living   0              Review of Systems  All other systems reviewed and are negative.   Allergies  Bupropion hcl and Sulfa antibiotics  Home Medications   Prior to Admission medications   Medication Sig Start Date End Date Taking? Authorizing Provider  albuterol (PROAIR HFA) 108 (90 BASE) MCG/ACT inhaler Inhale 2 puffs into the lungs every 6 (six) hours as needed for wheezing or shortness of breath. Before exercise. 03/30/14   Agapito Games, MD  busPIRone (BUSPAR) 15 MG tablet Take 1 tablet (15 mg total) by mouth as directed. 3 tablet in the morning and one tablet at  [facility]. 04/11/14   Archer Asa, MD  calcium-vitamin D (OSCAL WITH D) 500-200 MG-UNIT per tablet Take 1 tablet by mouth daily.      Historical Provider, MD  cephALEXin (KEFLEX) 500 MG capsule Take 1 capsule (500 mg total) by mouth 3 (three) times daily. 11/23/13   Doree Albee, MD  clobetasol ointment (TEMOVATE) 0.05 %  02/09/14   Historical Provider, MD  clonazePAM (KLONOPIN) 0.5 MG tablet TAKE 1 TABLET BY MOUTH EVERY DAY AS NEEDED 06/28/13   Agapito Games, MD  FLUoxetine (PROZAC) 40 MG capsule Take 2 capsules (80 mg total) by mouth daily. 03/16/14  Larena SoxShaji J Puthuvel, MD  levothyroxine (SYNTHROID, LEVOTHROID) 75 MCG tablet TAKE 1 TABLET (75 MCG TOTAL) BY MOUTH DAILY. 04/02/14   Agapito Gamesatherine D Metheney, MD  metaxalone (SKELAXIN) 800 MG tablet Take 1 tablet (800 mg total) by mouth 3 (three) times daily. 11/03/13   Agapito Gamesatherine D Metheney, MD  Multiple Vitamin (MULTIVITAMIN) tablet Take 1 tablet by mouth daily.      Historical Provider, MD  nitrofurantoin (MACRODANTIN) 50 MG capsule Take 1 capsule (50 mg total) by mouth daily as needed. 11/23/13   Doree AlbeeSteven Newton, MD  predniSONE (DELTASONE) 50 MG tablet  02/09/14   Historical Provider, MD  simvastatin (ZOCOR) 40 MG tablet TAKE 1 TABLET (40 MG TOTAL) BY MOUTH AT BEDTIME. 12/17/13   Agapito Gamesatherine D Metheney, MD  valACYclovir (VALTREX) 500 MG tablet Take 1 tablet (500 mg total) by  mouth 2 (two) times daily. X 3 days. 03/30/14   Agapito Gamesatherine D Metheney, MD   BP 104/75  Pulse 68  Temp(Src) 98.4 F (36.9 C) (Oral)  Ht 5\' 4"  (1.626 m)  Wt 150 lb (68.04 kg)  BMI 25.73 kg/m2  SpO2 96% Physical Exam  Nursing note and vitals reviewed. Constitutional: She is oriented to person, place, and time. She appears well-developed and well-nourished. No distress.  Uncomfortable from left foot pain . Sitting in wheelchair.  HENT:  Head: Normocephalic and atraumatic.  Eyes: Conjunctivae and EOM are normal. Pupils are equal, round, and reactive to light. No scleral icterus.  Neck: Normal range of motion.  Cardiovascular: Normal rate.   Pulmonary/Chest: Effort normal.  Abdominal: She exhibits no distension.  Musculoskeletal:       Left ankle: Normal. She exhibits normal range of motion. No tenderness. Achilles tendon normal. Achilles tendon exhibits normal Thompson's test results.       Left foot: She exhibits decreased range of motion, bony tenderness and swelling. She exhibits normal capillary refill, no deformity and no laceration.       Feet:  4+ tenderness to palpation, swelling, ecchymosis left lateral foot. Toes and toenails within normal limits. Neurovascular distally intact  Neurological: She is alert and oriented to person, place, and time.  Skin: Skin is warm. No rash noted.  Psychiatric: She has a normal mood and affect.    ED Course  Procedures (including critical care time) Labs Review Labs Reviewed - No data to display  Imaging Review Dg Foot Complete Left  07/26/2014   CLINICAL DATA:  Injury.  Left foot pain.  EXAM: LEFT FOOT - COMPLETE 3+ VIEW  COMPARISON:  None.  FINDINGS: There is a nondisplaced fracture of the proximal metaphysis of the fifth metatarsal. This is a Jones fracture. It does not involve the fifth metatarsal cuboid articulation.  No other fractures. Joints are normally spaced and aligned. There is lateral midfoot soft tissue swelling adjacent to  the fracture.  IMPRESSION: Nondisplaced fracture of the left fifth metatarsal proximal metaphysis.   Electronically Signed   By: Amie Portlandavid  Ormond M.D.   On: 07/26/2014 12:17     MDM   1. Nondisplaced fracture of fifth left metatarsal bone, closed, initial encounter    reviewed above x-ray with patient  Encourage rest, ice, compression with ACE bandage, and elevation of injured body part.--Ace bandage applied left foot Cam boot applied left foot She has enough hydrocodone at home to use when necessary severe pain Otherwise, ibuprofen 600 mg 3 times a day p.c. when necessary moderate pain Note written to excuse from work for the next 5 days as her  job requires standing and walking--any further work notes would need to be decided upon and written by orthopedist. Followup with orthopedist within the next 3-4 days. She declined crutches, but discussed that option. Precautions discussed. Red flags discussed. Questions invited and answered. Patient voiced understanding and agreement.   Lajean Manes, MD 07/26/14 [phone]

## 2014-08-08 ENCOUNTER — Telehealth: Payer: Self-pay | Admitting: *Deleted

## 2014-08-08 NOTE — Telephone Encounter (Signed)
Pt called and asked if Dr. Linford ArnoldMetheney would refill her pain medication. She was seen in [facility] 2 wks ago for a broken foot. I told her that Dr. Linford ArnoldMetheney would not give refills for her pain meds and that she should f/u with her. She proceeds to tell me that she has a f/u appt with an orthopedist I told her that she should get her pain meds from them and that she can try IBU, tylenol, rest, ice, moist heat for her pain. She stated that she has been doing this already.Loralee PacasBarkley, Vivi Piccirilli [place]

## 2014-09-10 ENCOUNTER — Telehealth (HOSPITAL_COMMUNITY): Payer: Self-pay

## 2014-09-11 ENCOUNTER — Encounter: Payer: Self-pay | Admitting: Family Medicine

## 2014-09-11 ENCOUNTER — Ambulatory Visit (INDEPENDENT_AMBULATORY_CARE_PROVIDER_SITE_OTHER): Payer: 59 | Admitting: Family Medicine

## 2014-09-11 VITALS — BP 102/65 | HR 66 | Temp 98.0°F | Ht 68.0 in | Wt 152.0 lb

## 2014-09-11 DIAGNOSIS — J441 Chronic obstructive pulmonary disease with (acute) exacerbation: Secondary | ICD-10-CM

## 2014-09-11 DIAGNOSIS — J209 Acute bronchitis, unspecified: Secondary | ICD-10-CM

## 2014-09-11 DIAGNOSIS — R0602 Shortness of breath: Secondary | ICD-10-CM

## 2014-09-11 MED ORDER — ALBUTEROL SULFATE (2.5 MG/3ML) 0.083% IN NEBU
2.5000 mg | INHALATION_SOLUTION | Freq: Once | RESPIRATORY_TRACT | Status: AC
  Administered 2014-09-11: 2.5 mg via RESPIRATORY_TRACT

## 2014-09-11 MED ORDER — PREDNISONE 20 MG PO TABS: 40.0000 mg | ORAL_TABLET | Freq: Every day | ORAL | Status: DC

## 2014-09-11 MED ORDER — DOXYCYCLINE HYCLATE 100 MG PO TABS: 100.0000 mg | ORAL_TABLET | Freq: Two times a day (BID) | ORAL | Status: DC

## 2014-09-11 NOTE — Patient Instructions (Signed)
ProAir - can use 2-4 puffs every 4 hours.   Complete the antibiotic. Call if not better in 5-6 days.   Take the prednisone.

## 2014-09-11 NOTE — Progress Notes (Signed)
   Subjective:    Patient ID: [name], female    DOB: 07/27/1956, 58 y.o.   MRN: [id number]  URI    Dry cough x 9 days she denies fevers,chills, and non productive cough.No ST or nasal congestion or HA.  she has been using dayquil this has not helped she reports feeling weak and has had trouble breathing and low energy. Hx of mild COPD.    Review of Systems     Objective:   Physical Exam  Constitutional: She is oriented to person, place, and time. She appears well-developed and well-nourished.  HENT:  Head: Normocephalic and atraumatic.  Right Ear: External ear normal.  Left Ear: External ear normal.  Nose: Nose normal.  Mouth/Throat: Oropharynx is clear and moist.  TMs and canals are clear.   Eyes: Conjunctivae and EOM are normal. Pupils are equal, round, and reactive to light.  Neck: Neck supple. No thyromegaly present.  Cardiovascular: Normal rate, regular rhythm and normal heart sounds.   Pulmonary/Chest: Effort normal and breath sounds normal. She has no wheezes.  Lymphadenopathy:    She has no cervical adenopathy.  Neurological: She is alert and oriented to person, place, and time.  Skin: Skin is warm and dry.  Psychiatric: She has a normal mood and affect.          Assessment & Plan:  Acute bronchitis-  Increase albuterol to every 4 hours 2-4 puffs.    COPD exacerbation - will treat with prednisone x5 days and doxycycline. If she's not improving in the next 5-6 days then please give the office a call back. Refill albuterol as needed.

## 2014-09-14 NOTE — Telephone Encounter (Signed)
Error

## 2014-10-05 ENCOUNTER — Ambulatory Visit (INDEPENDENT_AMBULATORY_CARE_PROVIDER_SITE_OTHER): Payer: 59 | Admitting: Family Medicine

## 2014-10-05 ENCOUNTER — Encounter: Payer: Self-pay | Admitting: Family Medicine

## 2014-10-05 VITALS — BP 126/88 | HR 69 | Temp 98.3°F | Wt 152.0 lb

## 2014-10-05 DIAGNOSIS — B9689 Other specified bacterial agents as the cause of diseases classified elsewhere: Secondary | ICD-10-CM

## 2014-10-05 DIAGNOSIS — J329 Chronic sinusitis, unspecified: Secondary | ICD-10-CM

## 2014-10-05 DIAGNOSIS — A499 Bacterial infection, unspecified: Secondary | ICD-10-CM

## 2014-10-05 MED ORDER — DOXYCYCLINE HYCLATE 100 MG PO TABS: ORAL_TABLET | ORAL | Status: DC

## 2014-10-05 MED ORDER — AMOXICILLIN-POT CLAVULANATE 500-125 MG PO TABS: ORAL_TABLET | ORAL | Status: AC

## 2014-10-05 NOTE — Progress Notes (Signed)
CC: [name] is a 58 y.o. female is here for Sore Throat and Cough   Subjective: HPI:  Complains of nasal congestion and sore throat that has been present for the past week worsening on a daily basis now moderate in severity. Slight improvement from lozenges nothing else seems to make better or worse. Accompanied by nonproductive cough mild in severity. Symptoms are persistent.the day. Reports subjective fever last night. Denies chills, sweats, shortness of breath, wheezing, blood in sputum, chest pain, motor or sensory disturbances   Review Of Systems Outlined In HPI  Past Medical History  Diagnosis Date  . Hypercholesterolemia   . Arthritis     Right Hip  . Hypothyroidism   . Chronic cystitis   . Dyspareunia   . Breast disorder      Rt Breast Calcifications  . Depression   . Anxiety   . Plantar fasciitis of left foot     2013    Past Surgical History  Procedure Laterality Date  . Scoliosis repair with harrington rods    . Breast enhancement surgery    . Plastic surgry to eyes     Family History  Problem Relation Age of Onset  . COPD Father     smoker  . Aneurysm    . Anxiety disorder Mother   . Depression Mother   . Alcohol abuse Father   . Heart murmur Father   . Drug abuse Brother   . Anxiety disorder Brother   . Depression Brother   . Dementia Maternal Grandfather     History   Social History  . Marital Status: Married    Spouse Name: N/A    Number of Children: N/A  . Years of Education: N/A   Occupational History  . Not on file.   Social History Main Topics  . Smoking status: Former Smoker    Quit date: 09/20/1982  . Smokeless tobacco: Never Used  . Alcohol Use: 0.0 oz/week    0.5 drink(s) per week     Comment: Use is 1-2 a month.  . Drug Use: No  . Sexual Activity: Yes    Partners: Male   Other Topics Concern  . Not on file   Social History Narrative  . No narrative on file     Objective: BP 126/88  Pulse 69  Temp(Src) 98.3 F  (36.8 C) (Oral)  Wt 152 lb (68.947 kg)  General: Alert and Oriented, No Acute Distress HEENT: Pupils equal, round, reactive to light. Conjunctivae clear.  External ears unremarkable, canals clear with intact TMs with appropriate landmarks.  Middle ear appears open without effusion. Pink inferior turbinates.  Moist mucous membranes, pharynx without inflammation nor lesion however moderate cobblestoning and postnasal drip.  Neck supple without palpable lymphadenopathy nor abnormal masses. Lungs: Clear to auscultation bilaterally, no wheezing/ronchi/rales.  Comfortable work of breathing. Good air movement. Extremities: No peripheral edema.  Strong peripheral pulses.  Mental Status: No depression, anxiety, nor agitation. Skin: Warm and dry.  Assessment & Plan: [name] was seen today for sore throat and cough.  Diagnoses and associated orders for this visit:  Bacterial sinusitis - Discontinue: doxycycline (VIBRA-TABS) 100 MG tablet; One by mouth twice a day for ten days. - amoxicillin-clavulanate (AUGMENTIN) 500-125 MG per tablet; Take one by mouth every 8 hours for ten total days.    Bacterial sinusitis: Start Augmentin consider nasal saline washes and Alka-Seltzer cold and sinus. Doxycycline was initially prescribed however after I saw her this was recently taken in September  her pharmacy was called to not fill this medication   Return if symptoms worsen or fail to improve.

## 2014-10-08 ENCOUNTER — Telehealth: Payer: Self-pay | Admitting: *Deleted

## 2014-10-08 NOTE — Telephone Encounter (Signed)
Pt left a message requesting reccomendations of otc cough medication. Since she doesn't have dx of hypertension called and left a message on her vm that she could try delsym or she can ask pharmacist about other reccomendations

## 2014-10-09 ENCOUNTER — Telehealth: Payer: Self-pay | Admitting: *Deleted

## 2014-10-09 MED ORDER — PREDNISONE 50 MG PO TABS: ORAL_TABLET | ORAL | Status: DC

## 2014-10-09 NOTE — Telephone Encounter (Signed)
Prednisone was Rxed by Dr. Judie PetitM on Sept 22nd and she should have been out of it by our encounter on Friday which is why it was taken off her med list.  If she's still having any nasal congestion or no improvement with her cough I'm ok with her restarting prednisone.  If she needs it can you call in 50mg  tabs one by mouth daily for five days?

## 2014-10-09 NOTE — Telephone Encounter (Signed)
Did you really mean to D?C prednisone for pt? Pt left a message that prednisone was to be sent to her pharm but it was not there.

## 2014-11-26 ENCOUNTER — Encounter: Payer: Self-pay | Admitting: Family Medicine

## 2014-11-26 ENCOUNTER — Ambulatory Visit (INDEPENDENT_AMBULATORY_CARE_PROVIDER_SITE_OTHER): Payer: 59 | Admitting: Family Medicine

## 2014-11-26 VITALS — BP 93/59 | HR 67 | Temp 98.2°F | Ht 68.0 in | Wt 155.0 lb

## 2014-11-26 DIAGNOSIS — Z23 Encounter for immunization: Secondary | ICD-10-CM

## 2014-11-26 DIAGNOSIS — E785 Hyperlipidemia, unspecified: Secondary | ICD-10-CM

## 2014-11-26 DIAGNOSIS — M545 Low back pain, unspecified: Secondary | ICD-10-CM

## 2014-11-26 DIAGNOSIS — E039 Hypothyroidism, unspecified: Secondary | ICD-10-CM

## 2014-11-26 DIAGNOSIS — M8580 Other specified disorders of bone density and structure, unspecified site: Secondary | ICD-10-CM

## 2014-11-26 MED ORDER — HYDROCODONE-ACETAMINOPHEN 10-325 MG PO TABS: 1.0000 | ORAL_TABLET | Freq: Two times a day (BID) | ORAL | Status: DC | PRN

## 2014-11-26 NOTE — Progress Notes (Signed)
Subjective:    Patient ID: Sharon AmbleNancy Greene, female    DOB: [birth year], 58 y.o.   MRN: [id number]  HPI COPD - Rarely using albuterol no chronic cough.  No excess sputum.  Last spirometry was in April 2015.  hypothyroid - No recent skin or hair changes.  No change in energy level. She is happy with her regimen and says she takes it consistently.  Chronic low Back Pain - she would lie refillon hydocodone. She plans on going back to the chiropractor. She's also going to start a walking group in February and is excited about this.  Review of Systems  BP 93/59 mmHg  Pulse 67  Temp(Src) 98.2 F (36.8 C)  Ht 5\' 8"  (1.727 m)  Wt 155 lb (70.308 kg)  BMI 23.57 kg/m2  SpO2 96%    Allergies  Allergen Reactions  . Bupropion Hcl     REACTION: irritabillity  . Sulfa Antibiotics Itching    Past Medical History  Diagnosis Date  . Hypercholesterolemia   . Arthritis     Right Hip  . Hypothyroidism   . Chronic cystitis   . Dyspareunia   . Breast disorder      Rt Breast Calcifications  . Depression   . Anxiety   . Plantar fasciitis of left foot     2013    Past Surgical History  Procedure Laterality Date  . Scoliosis repair with harrington rods    . Breast enhancement surgery    . Plastic surgry to eyes      History   Social History  . Marital Status: Married    Spouse Name: N/A    Number of Children: N/A  . Years of Education: N/A   Occupational History  . Not on file.   Social History Main Topics  . Smoking status: Former Smoker    Quit date: 09/20/1982  . Smokeless tobacco: Never Used  . Alcohol Use: 0.0 oz/week    0.5 drink(s) per week     Comment: Use is 1-2 a month.  . Drug Use: No  . Sexual Activity:    Partners: Male   Other Topics Concern  . Not on file   Social History Narrative    Family History  Problem Relation Age of Onset  . COPD Father     smoker  . Aneurysm    . Anxiety disorder Mother   . Depression Mother   . Alcohol abuse Father    . Heart murmur Father   . Drug abuse Brother   . Anxiety disorder Brother   . Depression Brother   . Dementia Maternal Grandfather     Outpatient Encounter Prescriptions as of 11/26/2014  Medication Sig  . albuterol (PROAIR HFA) 108 (90 BASE) MCG/ACT inhaler Inhale 2 puffs into the lungs every 6 (six) hours as needed for wheezing or shortness of breath. Before exercise.  . busPIRone (BUSPAR) 15 MG tablet Take 15 mg by mouth as directed. 3 tablet in the morning  . calcium-vitamin D (OSCAL WITH D) 500-200 MG-UNIT per tablet Take 2 tablets by mouth daily.   . clonazePAM (KLONOPIN) 0.5 MG tablet TAKE 1 TABLET BY MOUTH EVERY DAY AS NEEDED  . FLUoxetine (PROZAC) 40 MG capsule Take 2 capsules (80 mg total) by mouth daily.  Marland Kitchen. levothyroxine (SYNTHROID, LEVOTHROID) 75 MCG tablet TAKE 1 TABLET (75 MCG TOTAL) BY MOUTH DAILY.  . Multiple Vitamin (MULTIVITAMIN) tablet Take 1 tablet by mouth daily.    . [DISCONTINUED] predniSONE (DELTASONE) 50 MG  tablet Take one tablet by mouth once a day for five days  . HYDROcodone-acetaminophen (NORCO) 10-325 MG per tablet Take 1 tablet by mouth 2 (two) times daily as needed.          Objective:   Physical Exam  Constitutional: She is oriented to person, place, and time. She appears well-developed and well-nourished.  HENT:  Head: Normocephalic and atraumatic.  Cardiovascular: Normal rate, regular rhythm and normal heart sounds.   Pulmonary/Chest: Effort normal and breath sounds normal.  Neurological: She is alert and oriented to person, place, and time.  Skin: Skin is warm and dry.  Psychiatric: She has a normal mood and affect. Her behavior is normal.          Assessment & Plan:  COPD, mild-stable. Follow up in 6 months. Will do repeat spirometry at that time. Next  Hypothyroidism-due to recheck TSH. We will call with levels and adjust dose if needed. Next  Osteopenia-we'll check vitamin D level. Next  Hyperlipidemia-over due to recheck lipids.  Will call with results once available.  Chronic low back pain-hydrocodone refill today. Reminded her to continue to use fairly. She's mostly using Advil and plans on getting back in with her chiropractor.

## 2014-11-27 ENCOUNTER — Other Ambulatory Visit: Payer: Self-pay | Admitting: Family Medicine

## 2014-11-27 DIAGNOSIS — Z1231 Encounter for screening mammogram for malignant neoplasm of breast: Secondary | ICD-10-CM

## 2014-11-30 ENCOUNTER — Ambulatory Visit (HOSPITAL_BASED_OUTPATIENT_CLINIC_OR_DEPARTMENT_OTHER)
Admission: RE | Admit: 2014-11-30 | Discharge: 2014-11-30 | Disposition: A | Discharge status: Home / Self Care | Payer: 59 | Source: Ambulatory Visit | Attending: Family Medicine | Admitting: Family Medicine

## 2014-11-30 DIAGNOSIS — Z1231 Encounter for screening mammogram for malignant neoplasm of breast: Secondary | ICD-10-CM | POA: Insufficient documentation

## 2014-12-01 LAB — TSH: TSH: 4.374 u[IU]/mL (ref 0.350–4.500)

## 2014-12-01 LAB — COMPLETE METABOLIC PANEL WITH GFR
ALK PHOS: 50 U/L (ref 39–117)
ALT: 11 U/L (ref 0–35)
AST: 20 U/L (ref 0–37)
Albumin: 4.3 g/dL (ref 3.5–5.2)
BUN: 23 mg/dL (ref 6–23)
CO2: 30 mEq/L (ref 19–32)
CREATININE: 0.84 mg/dL (ref 0.50–1.10)
Calcium: 9.7 mg/dL (ref 8.4–10.5)
Chloride: 102 mEq/L (ref 96–112)
GFR, EST NON AFRICAN AMERICAN: 76 mL/min
GFR, Est African American: 88 mL/min
GLUCOSE: 77 mg/dL (ref 70–99)
Potassium: 4.9 mEq/L (ref 3.5–5.3)
Sodium: 140 mEq/L (ref 135–145)
Total Bilirubin: 0.3 mg/dL (ref 0.2–1.2)
Total Protein: 6.8 g/dL (ref 6.0–8.3)

## 2014-12-01 LAB — LIPID PANEL
CHOL/HDL RATIO: 4.5 ratio
CHOLESTEROL: 308 mg/dL — AB (ref 0–200)
HDL: 69 mg/dL (ref >39)
LDL Cholesterol: 222 mg/dL — ABNORMAL HIGH (ref 0–99)
TRIGLYCERIDES: 86 mg/dL (ref <150)
VLDL: 17 mg/dL (ref 0–40)

## 2014-12-01 LAB — VITAMIN D 25 HYDROXY (VIT D DEFICIENCY, FRACTURES): Vit D, 25-Hydroxy: 35 ng/mL (ref 30–100)

## 2014-12-03 ENCOUNTER — Telehealth: Payer: Self-pay | Admitting: *Deleted

## 2014-12-03 NOTE — Telephone Encounter (Signed)
Pt stated that Dr. Linford ArnoldMetheney gave her vicodin for her back and she has tried some OTC meds also. She wanted to know if there is something that she can take that is OTC and not as strong as vicodin that she can take in a day to day basis when not taking the vicodin. She stated that she only takes the vicodin when needed. She has tried advil, aleve, tylenol EX, bayer back and body. Will forward to pcp for f/u.Loralee PacasBarkley, Mialynn Shelvin [place]

## 2014-12-04 NOTE — Telephone Encounter (Signed)
She can combine the Aleve which is BID with the EX Tylenol Arthritis.  Has she gotten back in with her chiropractor? We can do PT as well.

## 2014-12-04 NOTE — Telephone Encounter (Signed)
Pt informed of recommendations.[name]  

## 2014-12-05 ENCOUNTER — Other Ambulatory Visit: Payer: Self-pay | Admitting: Family Medicine

## 2014-12-06 ENCOUNTER — Telehealth: Payer: Self-pay | Admitting: *Deleted

## 2014-12-06 DIAGNOSIS — M545 Low back pain: Secondary | ICD-10-CM

## 2014-12-06 NOTE — Telephone Encounter (Signed)
Pt would like referral to PT to be made.Sharon PacasBarkley, Sharon Millar [place]

## 2014-12-09 NOTE — Telephone Encounter (Signed)
Placed.  Thank you.

## 2014-12-18 ENCOUNTER — Encounter: Payer: Self-pay | Admitting: Obstetrics & Gynecology

## 2014-12-18 ENCOUNTER — Ambulatory Visit (INDEPENDENT_AMBULATORY_CARE_PROVIDER_SITE_OTHER): Payer: 59 | Admitting: Obstetrics & Gynecology

## 2014-12-18 VITALS — Ht 68.0 in | Wt 156.0 lb

## 2014-12-18 DIAGNOSIS — Z01419 Encounter for gynecological examination (general) (routine) without abnormal findings: Secondary | ICD-10-CM

## 2014-12-18 DIAGNOSIS — N952 Postmenopausal atrophic vaginitis: Secondary | ICD-10-CM

## 2014-12-18 DIAGNOSIS — M8580 Other specified disorders of bone density and structure, unspecified site: Secondary | ICD-10-CM

## 2014-12-18 MED ORDER — ESTRADIOL 10 MCG VA TABS: ORAL_TABLET | VAGINAL | Status: DC

## 2014-12-19 ENCOUNTER — Ambulatory Visit (HOSPITAL_COMMUNITY): Payer: Self-pay | Admitting: Physician Assistant

## 2014-12-19 ENCOUNTER — Telehealth: Payer: Self-pay | Admitting: *Deleted

## 2014-12-19 LAB — WET PREP BY MOLECULAR PROBE
Candida species: NEGATIVE
GARDNERELLA VAGINALIS: NEGATIVE
Trichomonas vaginosis: NEGATIVE

## 2014-12-19 NOTE — Progress Notes (Signed)
  Subjective:    [name] is a 58 y.o. female who presents for an annual exam.The patient is sexually active however seh is having pain with intercourse and would like some help.  Pain is with both insertion and with deep penetration. GYN screening history: last pap: was normal. (neg cotesting in 2013).  The patient wears seatbelts: yes. The patient participates in regular exercise: yes. The patient reports that there is not domestic violence in her life. Pt denies any post menopausal bleeding.  Pt had recent foot fracture and has been menopausal for >10 years.  She is low vit D level as well.    Menstrual History: OB History    Gravida Para Term Preterm AB TAB SAB Ectopic Multiple Living   0               No LMP recorded. Patient is postmenopausal.    The following portions of the patient's history were reviewed and updated as appropriate: allergies, current medications, past family history, past medical history, past social history, past surgical history and problem list.  Review of Systems Pertinent items are noted in HPI.    Objective:      Filed Vitals:   12/18/14 1449  Height: 5\' 8"  (1.727 m)  Weight: 156 lb (70.761 kg)   Vitals:  WNL General appearance: alert, cooperative and no distress Head: Normocephalic, without obvious abnormality, atraumatic Eyes: negative Throat: lips, mucosa, and tongue normal; teeth and gums normal Lungs: clear to auscultation bilaterally Breasts: normal appearance, no masses or tenderness, No nipple retraction or dimpling, No nipple discharge or bleeding Heart: regular rate and rhythm Abdomen: soft, non-tender; bowel sounds normal; no masses,  no organomegaly  Pelvic:  External Genitalia:  Tanner V, no lesion Urethra:  No prolapse Vagina:  Pale pink atrophic, friable with speculum, no discharge Cervix:  No CMT, no lesion Uterus:  Normal size and contour, non tender Adnexa:  Normal, no masses, non tender Rectovaginal Septum:  Non  tender, no masses  Extremities: no edema, redness or tenderness in the calves or thighs Skin: no lesions or rash Lymph nodes: Axillary adenopathy: none     .    Assessment:    Healthy female exam.   Atrohpic vaginitis    Plan:    Mammogram and pap up to date. Dexa bone scan for recent fracture and risk factors BD affirm to r/o vaginitis Vagifem

## 2014-12-19 NOTE — Telephone Encounter (Signed)
LM on voicemail of notified of neg wet prep.

## 2014-12-24 ENCOUNTER — Ambulatory Visit: Payer: Self-pay | Admitting: Physical Therapy

## 2014-12-24 ENCOUNTER — Ambulatory Visit (HOSPITAL_COMMUNITY)
Admission: RE | Admit: 2014-12-24 | Discharge: 2014-12-24 | Disposition: A | Discharge status: Home / Self Care | Payer: 59 | Source: Ambulatory Visit | Attending: Obstetrics & Gynecology | Admitting: Obstetrics & Gynecology

## 2014-12-24 DIAGNOSIS — Z13820 Encounter for screening for osteoporosis: Secondary | ICD-10-CM | POA: Diagnosis present

## 2014-12-24 DIAGNOSIS — M8580 Other specified disorders of bone density and structure, unspecified site: Secondary | ICD-10-CM | POA: Diagnosis not present

## 2014-12-28 ENCOUNTER — Ambulatory Visit (INDEPENDENT_AMBULATORY_CARE_PROVIDER_SITE_OTHER): Payer: 59 | Admitting: Physical Therapy

## 2014-12-28 DIAGNOSIS — M6281 Muscle weakness (generalized): Secondary | ICD-10-CM

## 2014-12-28 DIAGNOSIS — R293 Abnormal posture: Secondary | ICD-10-CM

## 2014-12-28 DIAGNOSIS — M545 Low back pain: Secondary | ICD-10-CM

## 2015-01-09 ENCOUNTER — Telehealth: Payer: Self-pay | Admitting: *Deleted

## 2015-01-09 NOTE — Telephone Encounter (Signed)
Pt notified of BMD results and recommendations.

## 2015-01-09 NOTE — Telephone Encounter (Signed)
-----   Message from Lesly DukesKelly H Leggett, MD sent at 01/08/2015  6:00 PM EST ----- Pt has osteopenia.  Needs to continue Vit D and Calcium as well as weight baring exercise.  Rpt Dexa in Jan 2018

## 2015-01-11 ENCOUNTER — Encounter: Payer: Self-pay | Admitting: Physical Therapy

## 2015-01-11 ENCOUNTER — Ambulatory Visit (HOSPITAL_COMMUNITY): Payer: Self-pay | Admitting: Psychiatry

## 2015-01-14 ENCOUNTER — Encounter: Payer: Self-pay | Admitting: Physical Therapy

## 2015-01-18 ENCOUNTER — Encounter (INDEPENDENT_AMBULATORY_CARE_PROVIDER_SITE_OTHER): Payer: Self-pay

## 2015-01-18 ENCOUNTER — Ambulatory Visit (INDEPENDENT_AMBULATORY_CARE_PROVIDER_SITE_OTHER): Payer: 59 | Admitting: Psychiatry

## 2015-01-18 ENCOUNTER — Encounter (HOSPITAL_COMMUNITY): Payer: Self-pay | Admitting: Psychiatry

## 2015-01-18 ENCOUNTER — Encounter (INDEPENDENT_AMBULATORY_CARE_PROVIDER_SITE_OTHER): Payer: 59 | Admitting: Physical Therapy

## 2015-01-18 VITALS — BP 101/80 | HR 80 | Ht 68.0 in | Wt 153.0 lb

## 2015-01-18 DIAGNOSIS — F332 Major depressive disorder, recurrent severe without psychotic features: Secondary | ICD-10-CM

## 2015-01-18 DIAGNOSIS — F4321 Adjustment disorder with depressed mood: Secondary | ICD-10-CM

## 2015-01-18 DIAGNOSIS — R293 Abnormal posture: Secondary | ICD-10-CM

## 2015-01-18 DIAGNOSIS — F411 Generalized anxiety disorder: Secondary | ICD-10-CM

## 2015-01-18 DIAGNOSIS — F419 Anxiety disorder, unspecified: Secondary | ICD-10-CM

## 2015-01-18 DIAGNOSIS — M6281 Muscle weakness (generalized): Secondary | ICD-10-CM

## 2015-01-18 DIAGNOSIS — M545 Low back pain: Secondary | ICD-10-CM

## 2015-01-18 MED ORDER — FLUOXETINE HCL 20 MG PO TABS: 60.0000 mg | ORAL_TABLET | Freq: Every day | ORAL | Status: DC

## 2015-01-18 MED ORDER — BUSPIRONE HCL 15 MG PO TABS: 15.0000 mg | ORAL_TABLET | ORAL | Status: DC

## 2015-01-18 MED ORDER — FLUOXETINE HCL 40 MG PO CAPS: 80.0000 mg | ORAL_CAPSULE | Freq: Every day | ORAL | Status: DC

## 2015-01-18 NOTE — Progress Notes (Signed)
Patient ID: [name], female   DOB: Oct 14, 1956, 59 y.o.   MRN: [id number]   [facility] Follow[facility] Out[facility] Visit  Sharon Ambleancy Trahan Oct 14, 1956  Date:01/17/2015  Chief Complaint:  Follow Up.  History of Chief Complaint:   HPI Comments: Ms. [name] is a 59 y/o female with a past psychiatric history significant for Major Depression, Recurrent severe. The patient is referred for psychiatric services for medication management.    . Location: She has not followed up for 7 to 8 months. Says she was able to get her meds and continues to take it.  Her depression dates back to 5 years ago when her brother was involved in drugs and she lost her mom. Those stressors are better now. Brother not using. She has supportive husband. Tolerating meds and she wanted to gradually get off some meds. NO panic attacks and worries are less bothersome. Decrease libido but otherwise no side effects.   In the area of affective symptoms, patient appears euthymic. Patient denies current suicidal ideation, intent, or plan. Patient denies current homicidal ideation, intent, or plan. Patient denies auditory hallucinations. Patient denies visual hallucinations. Patient denies symptoms of paranoia. Patient states sleep is good, with approximately 10  hours of sleep per night.  She reports appetite is good. Energy level is pretty good.  Patient denies symptoms of anhedonia. Patient denies hopelessness, helplessness, or guilt.   . Severity: Depression: 5-7/10 (0=Very depressed; 5=Neutral; 10=Very Happy)  Anxiety- 0-1/10 (0=no anxiety; 5= moderate/tolerable anxiety; 10= panic attacks)  . Duration: The patient notes depression since her early 930's; and worsened since 2008 with the death of her parents.  She states her depression has improved  . Timing: Mood is worse n the afternoon at 5 PM.  . Context: Being late. Being afraid she will "screw up at work."  . Modifying factors: Mood improves with  exercise.  . Associated signs and symptoms: Denies any recent episodes consistent with mania, particularly decreased need for sleep with increased energy, grandiosity, impulsivity, hyperverbal and pressured speech, or increased productivity. Denies any recent symptoms consistent with psychosis, particularly auditory or visual hallucinations, thought broadcasting/insertion/withdrawal, or ideas of reference. Also denies excessive worry to the point of physical symptoms as well as any panic attacks. Denies any history of trauma or symptoms consistent with PTSD such as flashbacks, nightmares, hypervigilance, feelings of numbness or inability to connect with others.     Review of Systems  Constitutional: Negative.   Cardiovascular: Negative for chest pain.  Gastrointestinal: Negative for nausea.  Neurological: Negative for tremors.  Psychiatric/Behavioral: Negative for suicidal ideas and substance abuse.     Filed Vitals:   01/18/15 1457  BP: 101/80  Pulse: 80  Height: 5\' 8"  (1.727 m)  Weight: 153 lb (69.4 kg)   Physical Exam  Vitals reviewed.  Constitutional: She appears well-developed and well-nourished. No distress.  Skin: She is not diaphoretic.   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   Suicidal Attempts: [name] denies   Violent Behaviors: Patient has been verbally aggressive.    Past Medical History: Reviewed Past Medical History  Diagnosis Date  . Hypercholesterolemia   . Arthritis  Right Hip  . Hypothyroidism   . Chronic cystitis   . Dyspareunia   . Breast disorder      Rt  Breast Calcifications  . Depression   . Anxiety   . Plantar fasciitis of left foot     2013   History of Loss of Consciousness: No  Seizure History: No  Cardiac History: No   Allergies: Reviewed Allergies  Allergen Reactions  . Bupropion Hcl     REACTION: irritabillity  . Sulfa Antibiotics Itching   Current Medications: Reviewed Current Outpatient Prescriptions on File Prior to Visit  Medication Sig Dispense Refill  . albuterol (PROAIR HFA) 108 (90 BASE) MCG/ACT inhaler Inhale 2 puffs into the lungs every 6 (six) hours as needed for wheezing or shortness of breath. Before exercise. 1 Inhaler 0  . calcium-vitamin D (OSCAL WITH D) 500-200 MG-UNIT per tablet Take 2 tablets by mouth daily.     . clonazePAM (KLONOPIN) 0.5 MG tablet TAKE 1 TABLET BY MOUTH EVERY DAY AS NEEDED 90 tablet 0  . Estradiol 10 MCG TABS vaginal tablet Insert one tablet per vagina nightly for 2 weeks then 2 times a week for 2 months 18 tablet 1  . HYDROcodone-acetaminophen (NORCO) 10-325 MG per tablet Take 1 tablet by mouth 2 (two) times daily as needed. 60 tablet 0  . levothyroxine (SYNTHROID, LEVOTHROID) 75 MCG tablet TAKE 1 TABLET (75 MCG TOTAL) BY MOUTH DAILY. 90 tablet 1  . Multiple Vitamin (MULTIVITAMIN) tablet Take 1 tablet by mouth daily.      . nitrofurantoin (MACRODANTIN) 50 MG capsule Take 50 mg by mouth 4 (four) times daily.     No current facility-administered medications on file prior to visit.   Previous Psychotropic Medications: Reviewed Medication   Prozac   Clonazapam   Buspirone   Alprazolam.    Substance Abuse History in the last 12 months: Reviewed History  Substance Use Topics  . Smoking status: Former Smoker    Quit date: 09/20/1982  . Smokeless tobacco: Never Used  . Alcohol Use: 0.0 oz/week    0.5 drink(s) per week     Comment: Use is 1-2 a month.  Caffeine: 2 cups a day  Illicit drugs: Patient admitts to cocaine and marijuana use in the 1980s.  Caffienated beverage: 4  cups.   Family History: Reviewed Family History   Problem  Relation  Age of Onset   .  COPD     .  Aneurysm     .  Anxiety disorder  Mother    .  Depression  Mother    .  Alcohol abuse  Father    .  Heart murmur  Father    .  Drug abuse  Brother    .  Anxiety disorder  Brother    .  Depression  Brother    .  Dementia  Maternal Grandfather     Psychiatric Specialty Exam:  Objective: Appearance: Casual and Well Groomed   Eye Contact:: Good   Speech: Clear and Coherent and Normal Rate   Volume: Normal   Mood: "It isn't as good as it should be giving all the postives"    Affect: Congruent and Full Range   Thought Process: Coherent, Logical and Loose   Orientation: Full   Thought Content: WDL   Suicidal Thoughts: No   Homicidal Thoughts: No   Judgement: Fair   Insight: Fair   Psychomotor Activity: Normal   Akathisia: No   Handed: Left   Memory: Immediate  3/3, recent: 1/3   . Assets: Communication Skills  Desire for Improvement  Financial Resources/Insurance  Housing  Leisure Time  Physical Health  Resilience  Transportation  Vocational/Educational    Laboratory/X-Ray  Psychological Evaluation(s)   NONE  NONE    Assessment:  AXIS I  Major Depression, Recurrent severe-stable and improving. Generalized anxiety disorder  AXIS II  No diagnosis   AXIS III  Past Medical History    Diagnosis  Date    .  Hypercholesterolemia     .  Arthritis       Right Hip    .  Hypothyroidism     .  Chronic cystitis     .  Decreased libido     .  Dyspareunia     .  Breast disorder       Rt Breast Calcifications    .  Depression     .  Anxiety     .  Plantar fasciitis of left foot       2013      AXIS IV  other psychosocial or environmental problems   AXIS V  65   Treatment Plan/Recommendations:   Plan of Care:  PLAN:  1. Affirm with the patient that the medications are taken as ordered. Patient  expressed understanding of how their medications were to be used.     Laboratory:  No labs warranted at this time.    Psychotherapy: Therapy: brief supportive therapy provided.  Discussed psychosocial stressors in detail. More than 50% of the visit was spent on individual therapy/counseling.   Medications:  Continue  the following psychiatric medications as written prior to this appointment with the following changes::  a) Lower Prozac 60 mg. Patient understands to call if symptoms worsen. Also decreased to counteract sexual concerns.  b) continue buspirone  2 in am one in the afernoon. c) Asked patient to limit and try to discontinue clonazepam. -Risks and benefits, side effects and alternatives discussed with patient, she was given an opportunity to ask questions about her medication, illness, and treatment. All current psychiatric medications have been reviewed and discussed with the patient and adjusted as clinically appropriate. The patient has been provided an accurate and updated list of the medications being now prescribed.   Routine PRN Medications:  Negative  Consultations: The patient was encouraged to keep all PCP and specialty clinic appointments. Advised her to go to PCP for exertional dyspnea.  Safety Concerns:   Patient told to call clinic if any problems occur. Patient advised to go to  ER  if she should develop SI/HI, side effects, or if symptoms worsen. Has crisis numbers to call if needed.    Other:   8. Patient was instructed to return to clinic in 2 months.  9. The patient was advised to call and cancel their mental health appointment within 24 hours of the appointment, if they are unable to keep the appointment, as well as the three no show and termination from clinic policy. 10. The patient expressed understanding of the plan and agrees with the above.      Thresa Ross, MD  01/18/2015 3:06 PM

## 2015-01-21 ENCOUNTER — Telehealth (HOSPITAL_COMMUNITY): Payer: Self-pay | Admitting: *Deleted

## 2015-01-21 ENCOUNTER — Encounter: Payer: 59 | Admitting: Physical Therapy

## 2015-01-21 NOTE — Telephone Encounter (Signed)
Per Dr. Gilmore LarocheAkhtar, please advise pt to take 2 (40mg ) tabs of Prozac, then the next day take 1 (40mg  total) of Prozac. Pt verbally states and shows understanding.

## 2015-01-21 NOTE — Telephone Encounter (Signed)
Pt would like to be advise on how to take Prozac. Please call pt to advise.

## 2015-01-25 ENCOUNTER — Encounter (INDEPENDENT_AMBULATORY_CARE_PROVIDER_SITE_OTHER): Payer: 59 | Admitting: Physical Therapy

## 2015-01-25 DIAGNOSIS — M545 Low back pain: Secondary | ICD-10-CM

## 2015-01-25 DIAGNOSIS — R293 Abnormal posture: Secondary | ICD-10-CM

## 2015-01-25 DIAGNOSIS — M6281 Muscle weakness (generalized): Secondary | ICD-10-CM

## 2015-01-28 ENCOUNTER — Encounter (INDEPENDENT_AMBULATORY_CARE_PROVIDER_SITE_OTHER): Payer: 59 | Admitting: Physical Therapy

## 2015-01-28 DIAGNOSIS — R293 Abnormal posture: Secondary | ICD-10-CM

## 2015-01-28 DIAGNOSIS — M545 Low back pain: Secondary | ICD-10-CM

## 2015-01-28 DIAGNOSIS — M6281 Muscle weakness (generalized): Secondary | ICD-10-CM

## 2015-02-01 ENCOUNTER — Encounter: Payer: 59 | Admitting: Physical Therapy

## 2015-02-19 ENCOUNTER — Other Ambulatory Visit (HOSPITAL_COMMUNITY): Payer: Self-pay | Admitting: Psychiatry

## 2015-02-27 ENCOUNTER — Other Ambulatory Visit: Payer: Self-pay | Admitting: *Deleted

## 2015-02-27 DIAGNOSIS — Z79890 Hormone replacement therapy (postmenopausal): Secondary | ICD-10-CM

## 2015-02-27 MED ORDER — ESTRADIOL 10 MCG VA TABS
ORAL_TABLET | VAGINAL | Status: DC
Start: 2015-02-27 — End: 2015-04-15

## 2015-02-27 NOTE — Telephone Encounter (Signed)
Pt never picked up her RX for Estradiol @ her pharmacy so med was resent per Dr Leggett's previous note.

## 2015-03-01 ENCOUNTER — Ambulatory Visit (INDEPENDENT_AMBULATORY_CARE_PROVIDER_SITE_OTHER): Payer: 59 | Admitting: Physician Assistant

## 2015-03-01 ENCOUNTER — Encounter: Payer: Self-pay | Admitting: Physician Assistant

## 2015-03-01 VITALS — BP 112/70 | HR 68 | Ht 68.0 in | Wt 159.0 lb

## 2015-03-01 DIAGNOSIS — R21 Rash and other nonspecific skin eruption: Secondary | ICD-10-CM | POA: Diagnosis not present

## 2015-03-01 MED ORDER — TRIAMCINOLONE ACETONIDE 0.1 % EX CREA: 1.0000 "application " | TOPICAL_CREAM | Freq: Two times a day (BID) | CUTANEOUS | Status: DC

## 2015-03-01 NOTE — Patient Instructions (Signed)
Actinic Keratosis Actinic keratosis is a precancerous growth on the skin. This means it could develop into skin cancer if it is not treated. About 1% of actinic keratoses turn into skin cancer within a year. It is important to have all such growths removed to prevent them from developing into skin cancer. CAUSES  Actinic keratosis is caused by getting too much ultraviolet (UV) radiation from the sun or other UV light sources. RISK FACTORS Factors that increase your chances of getting actinic keratosis include:  Having light-colored skin and blue eyes.  Having blonde or red hair.  Spending a lot of time in the sun.  Age. The risk of actinic keratosis increases with age. SYMPTOMS  Actinic keratosis growths look like scaly, rough spots of skin. They can be as small as a pinhead or as big as a quarter. They may itch, hurt, or feel sensitive. Sometimes there is a little tag of pink or gray skin growing off them. In some cases, actinic keratoses are easier felt than seen. They do not go away with the use of moisturizing lotions or creams. Actinic keratoses appear most often on areas of skin that get a lot of sun exposure. These areas include the:  Scalp.  Face.  Ears.  Lips.  Upper back.  Backs of the hands.  Forearms. DIAGNOSIS  Your health care provider can usually tell what is wrong by performing a physical exam. A tissue sample (biopsy) may also be taken and examined under a microscope. TREATMENT  Actinic keratosis can be treated several ways. Most treatments can be done in your health care provider's office. Treatment options may include:  Curettage. A tool is used to gently scrape off the growth.  Cryosurgery. Liquid nitrogen is applied to the growth to freeze it. The growth eventually falls off the skin.  Medicated creams, such as 5-fluorouracil or imiquimod. The medicine destroys the cells in the growth.  Chemical peels. Chemicals are applied to the growth and the outer  layers of skin are peeled off.  Photodynamic therapy. A drug that makes your skin more sensitive to light is applied to the skin. A strong, blue light is aimed at the skin and destroys the growth. PREVENTION  To prevent future sun damage:  Try to avoid the sun between 10:00 a.m. and 4:00 p.m. when it is the strongest.  Use a sunscreen or sunblock with SPF 30 or greater.  Apply sunscreen at least 30 minutes before exposure to the sun.  Always wear protective hats, clothing, and sunglasses with UV protection.  Avoid medicines, herbs, and foods that increase your sensitivity to sunlight.  Avoid tanning beds. HOME CARE INSTRUCTIONS   If your skin was covered with a bandage, change and remove the bandage as directed by your health care provider.  Keep the treated area dry as directed by your health care provider.  Apply any creams as prescribed by your health care provider. Follow the directions carefully.  Check your skin regularly for any changes.  Visit a skin doctor (dermatologist) every year for a skin exam. SEEK MEDICAL CARE IF:   Your skin does not heal and becomes irritated, red, or bleeds.  You notice any changes or new growths on your skin. Document Released: 03/05/2009 Document Revised: 04/23/2014 Document Reviewed: 01/18/2012 [employer] Patient Information 2015 [employer]. This information is not intended to replace advice given to you by your health care provider. Make sure you discuss any questions you have with your health care provider.  

## 2015-03-02 LAB — KOH PREP: RESULT - KOH: NONE SEEN

## 2015-03-04 NOTE — Progress Notes (Signed)
   Subjective:    Patient ID: [name], female    DOB: 1956-02-07, 59 y.o.   MRN: [id number]  HPI Pt presents to the clinic with rash for last 5-6 months on and off. Describes rash and itchy. No pain assoicated. Tried gold bond which seems to help some. Has not started any new detgerents or lotions lately. Rash is not anywhere else on the body. She does have a long hx of sun exposure.    Review of Systems  All other systems reviewed and are negative.      Objective:   Physical Exam  Pulmonary/Chest:            Assessment & Plan:  Rash- appears to be contact dermatitis. Given triamcinolone cream bid for next 2 weeks to use. Will check KOH. Due to sun exposure some of the more scaly lesions appear to be actinic keratosis. A little odd just all of a sudden pop out 6 months ago. If not improving come back in to consider cryotherpay of papules. Given HO on AK and sun exposure. Encouraged sunscreen.

## 2015-03-29 ENCOUNTER — Ambulatory Visit (HOSPITAL_COMMUNITY): Payer: Self-pay | Admitting: Psychiatry

## 2015-04-08 ENCOUNTER — Ambulatory Visit: Payer: Self-pay | Admitting: Family Medicine

## 2015-04-15 ENCOUNTER — Ambulatory Visit (INDEPENDENT_AMBULATORY_CARE_PROVIDER_SITE_OTHER): Payer: 59 | Admitting: Family Medicine

## 2015-04-15 ENCOUNTER — Encounter: Payer: Self-pay | Admitting: Family Medicine

## 2015-04-15 VITALS — BP 97/65 | HR 68 | Ht 68.0 in | Wt 157.0 lb

## 2015-04-15 DIAGNOSIS — M545 Low back pain, unspecified: Secondary | ICD-10-CM

## 2015-04-15 DIAGNOSIS — E038 Other specified hypothyroidism: Secondary | ICD-10-CM

## 2015-04-15 DIAGNOSIS — F411 Generalized anxiety disorder: Secondary | ICD-10-CM

## 2015-04-15 DIAGNOSIS — E559 Vitamin D deficiency, unspecified: Secondary | ICD-10-CM

## 2015-04-15 DIAGNOSIS — F332 Major depressive disorder, recurrent severe without psychotic features: Secondary | ICD-10-CM | POA: Diagnosis not present

## 2015-04-15 MED ORDER — HYDROCODONE-ACETAMINOPHEN 10-325 MG PO TABS: 1.0000 | ORAL_TABLET | Freq: Two times a day (BID) | ORAL | Status: DC | PRN

## 2015-04-15 MED ORDER — CLONAZEPAM 0.5 MG PO TABS: 0.5000 mg | ORAL_TABLET | Freq: Every day | ORAL | Status: DC | PRN

## 2015-04-15 NOTE — Progress Notes (Signed)
   Subjective:    Patient ID: [name], female    DOB: 1956-11-06, 59 y.o.   MRN: [id number]  HPI Chronic low Back Pain - she would lie refillon hydocodone. She had discussed working with a Chiropodistchiropracter but hasn't gone yet. Today she is doing well.  Says prolonged sitting and lifting aggrevate her back. She has been doing some walking. Today she is not in pain and not having any problems. She just uses medication as needed.    Anxiety/acute grief reaction-complains of feeling nervous and on edge nearly every day and not being up to control sleep her worrying nearly every day. She also complains of some irritability several days of the week. Rates her symptoms is somewhat difficult. Follow up with Dr. Fredda HammedAktar in about 2 weeks. She really tried to use her clonazepam sparingly.    Hypothyroid - feels a dec in energy level.  Skin is more dry.  No hair changes. She is on 75mcg per days.   Vit D was also low on labs in [place]. In march she added calcium with D with 1000 IU daily.     Review of Systems     Objective:   Physical Exam  Constitutional: She is oriented to person, place, and time. She appears well-developed and well-nourished.  HENT:  Head: Normocephalic and atraumatic.  Neck: Neck supple. No thyromegaly present.  Cardiovascular: Normal rate, regular rhythm and normal heart sounds.   Pulmonary/Chest: Effort normal and breath sounds normal.  Lymphadenopathy:    She has no cervical adenopathy.  Neurological: She is alert and oriented to person, place, and time.  Skin: Skin is warm and dry.  Psychiatric: She has a normal mood and affect. Her behavior is normal.          Assessment & Plan:  Chronic low back pain- will refill the hydrocodone. Recommend contiue to work on consistant walking program. F/U in 6 months.    Moderate anxiety/grief reaction-I did go ahead and refill her clonazepam today. She does use the medication sparingly. 90 tabs last her more than a year. She  is to keep her follow-up appointment with Dr. Gilmore LarocheAkhtar.  Hypothyroid - last level was at 4.3. We'll recheck. With increased fatigue it may be worth adjusting her medication to put her shirt TSH a little lower to see if she feels better.  Vit D - she did start supplementation about a month ago. We can recheck this at her next visit.

## 2015-04-16 ENCOUNTER — Other Ambulatory Visit: Payer: Self-pay | Admitting: *Deleted

## 2015-04-16 DIAGNOSIS — E039 Hypothyroidism, unspecified: Secondary | ICD-10-CM

## 2015-04-16 LAB — TSH: TSH: 6.095 u[IU]/mL — AB (ref 0.350–4.500)

## 2015-04-16 MED ORDER — LEVOTHYROXINE SODIUM 88 MCG PO TABS: 88.0000 ug | ORAL_TABLET | Freq: Every day | ORAL | Status: DC

## 2015-04-16 NOTE — Addendum Note (Signed)
Addended by: Nani GasserMETHENEY, CATHERINE D on: 04/16/2015 05:05 PM   Modules accepted: Orders

## 2015-04-29 ENCOUNTER — Ambulatory Visit (INDEPENDENT_AMBULATORY_CARE_PROVIDER_SITE_OTHER): Payer: 59 | Admitting: Psychiatry

## 2015-04-29 ENCOUNTER — Encounter (HOSPITAL_COMMUNITY): Payer: Self-pay | Admitting: Psychiatry

## 2015-04-29 VITALS — BP 108/66 | HR 82 | Ht 68.0 in | Wt 158.0 lb

## 2015-04-29 DIAGNOSIS — Z634 Disappearance and death of family member: Secondary | ICD-10-CM | POA: Diagnosis not present

## 2015-04-29 DIAGNOSIS — F4321 Adjustment disorder with depressed mood: Secondary | ICD-10-CM

## 2015-04-29 DIAGNOSIS — F332 Major depressive disorder, recurrent severe without psychotic features: Secondary | ICD-10-CM

## 2015-04-29 DIAGNOSIS — F411 Generalized anxiety disorder: Secondary | ICD-10-CM | POA: Diagnosis not present

## 2015-04-29 DIAGNOSIS — F419 Anxiety disorder, unspecified: Secondary | ICD-10-CM

## 2015-04-29 DIAGNOSIS — F331 Major depressive disorder, recurrent, moderate: Secondary | ICD-10-CM

## 2015-04-29 NOTE — Progress Notes (Signed)
Patient ID: [name], female   DOB: [birth year], 59 y.o.   MRN: [id number]   [name]  [name] [birth year]  Date:04/29/2015  Chief Complaint:  Follow Up.  History of Chief Complaint:   HPI Comments: [name] is a 59 y/o female with a past psychiatric history significant for Major Depression, Recurrent severe, generalized anxiety and bereavement. The patient is referred for psychiatric services for medication management.    . Location:  Her depression dates back to 5 years ago when her brother was involved in drugs and she lost her mom. Those stressors are better now. Brother not using. She has supportive husband. Tolerating meds and she wanted to gradually get off some meds. NO panic attacks and worries are less bothersome. Decrease libido but otherwise no side effects.   Depression; near stable. Work keeps her busy. Bereavement: anniversary of her mom and dad is around mothers and fathers day month. That keeps her down during spring month. ongoing  Anxiety: related to stress and anniversaries. meds help including buspirone.  . Severity: Depression: 5-7/10 (0=Very depressed; 5=Neutral; 10=Very Happy)  Anxiety- 0-1/10 (0=no anxiety; 5= moderate/tolerable anxiety; 10= panic attacks)  . Duration: The patient notes depression since her early 630's; and worsened since 2008 with the death of her parents.  She states her depression has improved  . Timing: Mood is worse n the afternoon at 5 PM.  . Context: Being late. Being afraid she will "screw up at work."  . Modifying factors: Mood improves with exercise.  . Associated signs and symptoms: Denies any recent episodes consistent with mania, particularly decreased need for sleep with increased energy, grandiosity, impulsivity, hyperverbal and pressured speech, or increased productivity. Denies any recent symptoms consistent with psychosis, particularly auditory or visual hallucinations,  thought broadcasting/insertion/withdrawal, or ideas of reference. Also denies excessive worry to the point of physical symptoms as well as any panic attacks. Denies any history of trauma or symptoms consistent with PTSD such as flashbacks, nightmares, hypervigilance, feelings of numbness or inability to connect with others.     Review of Systems  Constitutional: Negative.   Skin: Negative for rash.  Psychiatric/Behavioral: Negative for suicidal ideas and substance abuse.     Filed Vitals:   04/29/15 1542  BP: 108/66  Pulse: 82  Height: 5\' 8"  (1.727 m)  Weight: 158 lb (71.668 kg)  SpO2: 95%   Physical Exam  Vitals reviewed.  Constitutional: She appears well-developed and well-nourished. No distress.  Skin: She is not diaphoretic.   Musculoskeletal: Strength & Muscle Tone: within normal limits Gait & Station: normal Patient leans: N/A   Past Medical History: Reviewed Past Medical History  Diagnosis Date  . Hypercholesterolemia   . Arthritis     Right Hip  . Hypothyroidism   . Chronic cystitis   . Dyspareunia   . Breast disorder      Rt Breast Calcifications  . Depression   . Anxiety   . Plantar fasciitis of left foot     2013   Allergies: Reviewed Allergies  Allergen Reactions  . Bupropion Hcl     REACTION: irritabillity  . Sulfa Antibiotics Itching   Current Medications: Reviewed Current Outpatient Prescriptions on File Prior to Visit  Medication Sig Dispense Refill  . albuterol (PROAIR HFA) 108 (90 BASE) MCG/ACT inhaler Inhale 2 puffs into the lungs every 6 (six) hours as needed for wheezing or shortness of breath. Before exercise. 1 Inhaler 0  . busPIRone (BUSPAR) 15 MG tablet Take  1 tablet (15 mg total) by mouth as directed. Three tablets a day 270 tablet 0  . calcium-vitamin D (OSCAL WITH D) 500-200 MG-UNIT per tablet Take 2 tablets by mouth daily.     . cholecalciferol (VITAMIN D) 1000 UNITS tablet Take 1,000 Units by mouth 2 (two) times daily.    .  clonazePAM (KLONOPIN) 0.5 MG tablet Take 1 tablet (0.5 mg total) by mouth daily as needed. 90 tablet 0  . FLUoxetine (PROZAC) 40 MG capsule   0  . HYDROcodone-acetaminophen (NORCO) 10-325 MG per tablet Take 1 tablet by mouth 2 (two) times daily as needed. 60 tablet 0  . levothyroxine (SYNTHROID, LEVOTHROID) 88 MCG tablet Take 1 tablet (88 mcg total) by mouth daily before breakfast. 90 tablet 0  . Multiple Vitamin (MULTIVITAMIN) tablet Take 1 tablet by mouth daily.       No current facility-administered medications on file prior to visit.   Previous Psychotropic Medications: Reviewed Medication   Prozac   Clonazapam   Buspirone   Alprazolam.    Substance Abuse History in the last 12 months: Reviewed History  Substance Use Topics  . Smoking status: Former Smoker    Quit date: 09/20/1982  . Smokeless tobacco: Never Used  . Alcohol Use: 0.0 oz/week    0.5 drink(s) per week     Comment: Use is 1-2 a month.  Caffeine: 2 cups a day   Family History: Reviewed Family History   Problem  Relation  Age of Onset   .  COPD     .  Aneurysm     .  Anxiety disorder  Mother    .  Depression  Mother    .  Alcohol abuse  Father    .  Heart murmur  Father    .  Drug abuse  Brother    .  Anxiety disorder  Brother    .  Depression  Brother    .  Dementia  Maternal Grandfather     Psychiatric Specialty Exam:  Objective: Appearance: Casual and Well Groomed   Eye Contact:: Good   Speech: Clear and Coherent and Normal Rate   Volume: Normal   Mood: euthymic   Affect: Congruent and Full Range   Thought Process: Coherent, Logical and Loose   Orientation: Full   Thought Content: WDL   Suicidal Thoughts: No   Homicidal Thoughts: No   Judgement: Fair   Insight: Fair   Psychomotor Activity: Normal   Akathisia: No   Handed: Left   Memory: Immediate 3/3, recent: 1/3   . Assets: Communication Skills  Desire for Improvement  Financial Resources/Insurance  Housing  Leisure Time  Physical  Health  Resilience  Transportation  Vocational/Educational    Laboratory/X-Ray  Psychological Evaluation(s)   NONE  NONE    Assessment:  AXIS I  Major Depression, Recurrent severe-stable and improving. Generalized anxiety disorder. bereavement  AXIS II  No diagnosis   AXIS III  Past Medical History    Diagnosis  Date    .  Hypercholesterolemia     .  Arthritis       Right Hip    .  Hypothyroidism     .  Chronic cystitis     .  Decreased libido     .  Dyspareunia     .  Breast disorder       Rt Breast Calcifications    .  Depression     .  Anxiety     .  Plantar fasciitis of left foot       2013      AXIS IV  other psychosocial or environmental problems   AXIS V  64   Treatment Plan/Recommendations:   Plan of Care:  PLAN:  1. Affirm with the patient that the medications are taken as ordered. Patient  expressed understanding of how their medications were to be used.    Laboratory:  No labs warranted at this time.    Psychotherapy: Therapy: brief supportive therapy provided.  Discussed psychosocial stressors in detail. More than 50% of the visit was spent on individual therapy/counseling.   Medications:  Continue  the following psychiatric medications as written prior to this appointment with the following changes:: continue following for depression, anxiety.  A) take prozac 40mg  2 a day and next day 1 a day. Alternatively. Cautious about sexual side effects concern . Has refill. Can call for refill.  b) continue buspirone 15mg  2 in am one in the afernoon. Has refills , can call for refill c) Asked patient to limit and try to discontinue clonazepam. -Risks and benefits, side effects and alternatives discussed with patient, she was given an opportunity to ask questions about her medication, illness, and treatment. All current psychiatric medications have been reviewed and discussed with the patient and adjusted as clinically appropriate. The patient has been provided an  accurate and updated list of the medications being now prescribed.   Routine PRN Medications:  Negative  Consultations: The patient was encouraged to keep all PCP and specialty clinic appointments. Advised her to go to PCP for exertional dyspnea.  Safety Concerns:   Patient told to call clinic if any problems occur. Patient advised to go to  ER  if she should develop SI/HI, side effects, or if symptoms worsen. Has crisis numbers to call if needed.    Other:   8. Patient was instructed to return to clinic in 3 months.  9. The patient was advised to call and cancel their mental health appointment within 24 hours of the appointment, if they are unable to keep the appointment, as well as the three no show and termination from clinic policy. 10. The patient expressed understanding of the plan and agrees with the above.      Thresa RossNadeem Keeven Matty, MD  04/29/2015 4:00 PM

## 2015-05-09 ENCOUNTER — Other Ambulatory Visit: Payer: Self-pay | Admitting: Obstetrics & Gynecology

## 2015-05-09 DIAGNOSIS — N83202 Unspecified ovarian cyst, left side: Secondary | ICD-10-CM

## 2015-05-09 NOTE — Progress Notes (Signed)
Patient entered a medical study for vaginal dryness.  Baseline US at OSH showed ? 2 sub centimeter fibroids and a 1.4 cm left adnexal mass.  Study did not comment on whether cyst was simple or complex.  [facility]

## 2015-05-10 ENCOUNTER — Encounter: Payer: Self-pay | Admitting: Sports Medicine

## 2015-05-10 ENCOUNTER — Ambulatory Visit (INDEPENDENT_AMBULATORY_CARE_PROVIDER_SITE_OTHER): Payer: 59

## 2015-05-10 ENCOUNTER — Ambulatory Visit (INDEPENDENT_AMBULATORY_CARE_PROVIDER_SITE_OTHER): Payer: 59 | Admitting: Sports Medicine

## 2015-05-10 VITALS — BP 115/71 | HR 57 | Wt 162.0 lb

## 2015-05-10 DIAGNOSIS — M25561 Pain in right knee: Secondary | ICD-10-CM | POA: Diagnosis not present

## 2015-05-10 DIAGNOSIS — M25562 Pain in left knee: Secondary | ICD-10-CM

## 2015-05-10 DIAGNOSIS — M1711 Unilateral primary osteoarthritis, right knee: Secondary | ICD-10-CM | POA: Insufficient documentation

## 2015-05-10 DIAGNOSIS — M545 Low back pain: Secondary | ICD-10-CM | POA: Diagnosis not present

## 2015-05-10 DIAGNOSIS — M47896 Other spondylosis, lumbar region: Secondary | ICD-10-CM

## 2015-05-10 DIAGNOSIS — M2241 Chondromalacia patellae, right knee: Secondary | ICD-10-CM | POA: Diagnosis not present

## 2015-05-10 DIAGNOSIS — M47816 Spondylosis without myelopathy or radiculopathy, lumbar region: Secondary | ICD-10-CM | POA: Insufficient documentation

## 2015-05-10 MED ORDER — MELOXICAM 15 MG PO TABS: ORAL_TABLET | ORAL | Status: DC

## 2015-05-10 NOTE — Assessment & Plan Note (Signed)
X-rays, meloxicam, formal physical therapy. Typically scoliosis itself is not painful however it can result in early facet arthritis. On further questioning she really didn't have back pain until approximately 6 years ago. Pain does sound facet mediated, predominantly on the right side, axial without a radicular component. Return to see me in one month, if no better we will get an MRI for interventional planning.

## 2015-05-10 NOTE — Assessment & Plan Note (Signed)
Injection, meloxicam, x-rays, formal PT. She does get some pinching and mechanical symptoms in the anterolateral joint line suggestive of a meniscal tear. If she does not respond to injections and PT she will need an arthroscopy.

## 2015-05-10 NOTE — Progress Notes (Signed)
   Subjective:    I'm seeing this patient as a consultation for:  Dr. Nani Gasseratherine Metheney  CC: Right knee pain, back pain  HPI: Right knee pain: Present for years, has had steroid injections with good responses, has never had physical therapy. Pain is localized under the kneecap, moderate, persistent without radiation, she does get occasional sharp stabbing pains at the anterolateral joint line that results in occasional knee locking. Only minimal swelling is present.  Back pain: History of scoliosis, she endorses back pain for approximately the last 6 years, localized in the right side of the lower lumbar spine, worse with both sitting and standing, but not with Valsalva, and without any radiation or radicular symptoms, no bowel or bladder dysfunction, saddle numbness.  Past medical history, Surgical history, Family history not pertinant except as noted below, Social history, Allergies, and medications have been entered into the medical record, reviewed, and no changes needed.   Review of Systems: No headache, visual changes, nausea, vomiting, diarrhea, constipation, dizziness, abdominal pain, skin rash, fevers, chills, night sweats, weight loss, swollen lymph nodes, body aches, joint swelling, muscle aches, chest pain, shortness of breath, mood changes, visual or auditory hallucinations.   Objective:   General: Well Developed, well nourished, and in no acute distress.  Neuro/Psych: Alert and oriented x3, extra-ocular muscles intact, able to move all 4 extremities, sensation grossly intact. Skin: Warm and dry, no rashes noted.  Respiratory: Not using accessory muscles, speaking in full sentences, trachea midline.  Cardiovascular: Pulses palpable, no extremity edema. Abdomen: Does not appear distended. Back Exam:  Inspection: Unremarkable  Motion: Flexion 45 deg, Extension 45 deg, Side Bending to 45 deg bilaterally,  Rotation to 45 deg bilaterally  SLR laying: Negative  XSLR laying:  Negative  Palpable tenderness: None. FABER: negative. Sensory change: Gross sensation intact to all lumbar and sacral dermatomes.  Reflexes: 2+ at both patellar tendons, 2+ at achilles tendons, Babinski's downgoing.  Strength at foot  Plantar-flexion: 5/5 Dorsi-flexion: 5/5 Eversion: 5/5 Inversion: 5/5  Leg strength  Quad: 5/5 Hamstring: 5/5 Hip flexor: 5/5 Hip abductors: 5/5  Gait unremarkable. Right Knee: Visibly swollen with a mild fluid wave and tenderness under the lateral patellar facet as well as the anterolateral joint line. ROM normal in flexion and extension and lower leg rotation. Ligaments with solid consistent endpoints including ACL, PCL, LCL, MCL. Negative Mcmurray's and provocative meniscal tests. Non painful patellar compression. Patellar and quadriceps tendons unremarkable. Hamstring and quadriceps strength is normal.  Procedure: Real-time Ultrasound Guided Injection of right knee Device: GE Logiq E  Verbal informed consent obtained.  Time-out conducted.  Noted no overlying erythema, induration, or other signs of local infection.  Skin prepped in a sterile fashion.  Local anesthesia: Topical Ethyl chloride.  With sterile technique and under real time ultrasound guidance:   2 mL kenalog 40, 4 mL lidocaine injected easily into the suprapatellar recess.  Completed without difficulty  Pain immediately resolved suggesting accurate placement of the medication.  Advised to call if fevers/chills, erythema, induration, drainage, or persistent bleeding.  Images permanently stored and available for review in the ultrasound unit.  Impression: Technically successful ultrasound guided injection.  Impression and Recommendations:   This case required medical decision making of moderate complexity.

## 2015-05-27 ENCOUNTER — Ambulatory Visit (HOSPITAL_COMMUNITY): Payer: 59

## 2015-06-01 LAB — TSH: TSH: 4.005 u[IU]/mL (ref 0.350–4.500)

## 2015-06-07 ENCOUNTER — Ambulatory Visit (INDEPENDENT_AMBULATORY_CARE_PROVIDER_SITE_OTHER): Payer: 59 | Admitting: Sports Medicine

## 2015-06-07 ENCOUNTER — Encounter: Payer: Self-pay | Admitting: Sports Medicine

## 2015-06-07 VITALS — BP 109/74 | HR 96 | Ht 68.0 in | Wt 163.0 lb

## 2015-06-07 DIAGNOSIS — M47896 Other spondylosis, lumbar region: Secondary | ICD-10-CM

## 2015-06-07 DIAGNOSIS — M2241 Chondromalacia patellae, right knee: Secondary | ICD-10-CM

## 2015-06-07 NOTE — Assessment & Plan Note (Signed)
Minimal response to injection. Is probably a lateral meniscal tear as well as some patella femoral chondromalacia. MRI. She does need to do physical therapy.

## 2015-06-07 NOTE — Progress Notes (Signed)
  Subjective:    CC: Follow-up  HPI: Right knee pain: She did not respond completely to the injection though she did get some relief, she continues to have anterolateral pain, worse with certain positions. She has not done any physical therapy.  Low back pain: Likely facet mediated, she does have some scoliosis and moderate facet arthritis on x-rays. Again, she has unfortunately not done any formal physical therapy.  Past medical history, Surgical history, Family history not pertinant except as noted below, Social history, Allergies, and medications have been entered into the medical record, reviewed, and no changes needed.   Review of Systems: No fevers, chills, night sweats, weight loss, chest pain, or shortness of breath.   Objective:    General: Well Developed, well nourished, and in no acute distress.  Neuro: Alert and oriented x3, extra-ocular muscles intact, sensation grossly intact.  HEENT: Normocephalic, atraumatic, pupils equal round reactive to light, neck supple, no masses, no lymphadenopathy, thyroid nonpalpable.  Skin: Warm and dry, no rashes. Cardiac: Regular rate and rhythm, no murmurs rubs or gallops, no lower extremity edema.  Respiratory: Clear to auscultation bilaterally. Not using accessory muscles, speaking in full sentences.  Impression and Recommendations:

## 2015-06-07 NOTE — Assessment & Plan Note (Signed)
X-rays do show fairly moderate facet arthritis. Again, she does need to do physical therapy.

## 2015-06-10 ENCOUNTER — Ambulatory Visit (INDEPENDENT_AMBULATORY_CARE_PROVIDER_SITE_OTHER): Payer: 59 | Admitting: Rehabilitative and Restorative Service Providers"

## 2015-06-10 ENCOUNTER — Encounter: Payer: Self-pay | Admitting: Rehabilitative and Restorative Service Providers"

## 2015-06-10 DIAGNOSIS — M545 Low back pain, unspecified: Secondary | ICD-10-CM

## 2015-06-10 DIAGNOSIS — M2560 Stiffness of unspecified joint, not elsewhere classified: Secondary | ICD-10-CM | POA: Diagnosis not present

## 2015-06-10 DIAGNOSIS — R531 Weakness: Secondary | ICD-10-CM

## 2015-06-10 DIAGNOSIS — Z7409 Other reduced mobility: Secondary | ICD-10-CM | POA: Diagnosis not present

## 2015-06-10 NOTE — Therapy (Signed)
[facility] [place] [address] [place], [place], [address] Phone: [phone]   Fax:  [phone]  Physical Therapy Evaluation  Patient Details  Name: [name] MRN: [id number] Date of Birth: Mar 27, 1956 Referring Provider:  Monica Becton,*  Encounter Date: 06/10/2015      PT End of Session - 06/10/15 0951    Visit Number 1   PT Start Time 0951   PT Stop Time 1056   PT Time Calculation (min) 65 min   Activity Tolerance Patient tolerated treatment well;No increased pain   Behavior During Therapy [facility] [place] for tasks assessed/performed      Past Medical History  Diagnosis Date  . Hypercholesterolemia   . Arthritis     Right Hip  . Hypothyroidism   . Chronic cystitis   . Dyspareunia   . Breast disorder      Rt Breast Calcifications  . Depression   . Anxiety   . Plantar fasciitis of left foot     2013    Past Surgical History  Procedure Laterality Date  . Scoliosis repair with harrington rods    . Breast enhancement surgery    . Plastic surgry to eyes      There were no vitals filed for this visit.  Visit Diagnosis:  Midline low back pain without sciatica - Plan: PT plan of care cert/re-cert  Stiffness in joint - Plan: PT plan of care cert/re-cert  Weakness generalized - Plan: PT plan of care cert/re-cert  Decreased strength, endurance, and mobility - Plan: PT plan of care cert/re-cert      Subjective Assessment - 06/10/15 0954    Subjective Patient c/o LBP with increased sypmtoms in the past 6 months with no known injury. History of scoliosis and LBP for past 10 years.    Pertinent History Spinal fusion with Harrington rods 1983   How long can you sit comfortably? 30-40 min   How long can you stand comfortably? 15 min   How long can you walk comfortably? 2-3 hours   Diagnostic tests xray - degenerative changes   Patient Stated Goals Painfree; decreasd pain    Currently in Pain? Yes   Pain Score  2    Pain Location Back   Pain Orientation Lower   Pain Descriptors / Indicators Dull;Tightness   Pain Radiating Towards towards both sides of back   Pain Onset More than a month ago   Pain Frequency Intermittent   Aggravating Factors  sitting; standing; lifting wheelchairs; bending; cleaning; dustng   Pain Relieving Factors pain pills; ice; hot bath   Effect of Pain on Daily Activities goes on through pain            [facility] PT Assessment - 06/10/15 0001    Assessment   Medical Diagnosis LBP/knee pain   Onset Date/Surgical Date 12/05/15   Hand Dominance Left   Next MD Visit no appt scheduled   Prior Therapy here ~ 5 months ago   Balance Screen   Has the patient fallen in the past 6 months No   Home Environment   Living Environment Private residence   Type of Home House   Home Access Stairs to enter   Entrance Stairs-Number of Steps 13   Entrance Stairs-Rails Left   Home Layout Multi-level   Prior Function   Level of Independence Independent   Vocation Part time employment   Vocation Requirements 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; 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; has to lift wheelchairs    Leisure scrapbooking; cooking/laundry/household chores   Sensation   Light Touch --  WFL's per patient report   AROM   Lumbar Flexion 90%   Lumbar Extension 70%   Lumbar - Right Side Bend 80%   Lumbar - Left Side Bend 80%   Lumbar - Right Rotation 80%   Lumbar - Left Rotation 80%   Strength   Overall Strength Comments LE strength bilat LE's WFL's except hip ext 4+/5    Palpation   Spinal mobility Pain with spring testing PA lumbar spine and mid thoracic          [facility] Adult PT Treatment/Exercise - 06/10/15 0001    Posture/Postural Control   Posture Comments To avoid standing with knees hyperextended   Lumbar Exercises: Stretches   Quad Stretch 3 reps;20 seconds  with strap   Lumbar Exercises: Standing   Wall Slides 10 reps  10 sec hold  with 3 part core   Lumbar Exercises: Supine   Ab Set 10 reps  10 sec hold 3 part core   Cryotherapy   Number Minutes Cryotherapy 12 Minutes   Cryotherapy Location Lumbar Spine   Type of Cryotherapy Ice pack            PT Education - 06/10/15 1104    Education provided Yes   Education Details Importance of consistent esercise with chronic pain; avoid standing with knees hyperextended; core stabiliation; exercises   Person(s) Educated Patient   Methods Explanation;Demonstration;Tactile cues;Verbal cues;Handout   Comprehension Verbalized understanding;Returned demonstration;Verbal cues required;Tactile cues required          PT Short Term Goals - 06/10/15 1111    PT SHORT TERM GOAL #1   Title Paient I in initial HEP (06/25/15)   Time 2   Period Weeks   Status New   PT SHORT TERM GOAL #2   Title Patient to demonstrate good core contraction (07/01/15)   Time 3   Period Weeks   Status New   PT SHORT TERM GOAL #3   Title Initiate lumbar stabilization program (06/25/15)   Time 2   Period Weeks   Status New           PT Long Term Goals - 06/10/15 1115    PT LONG TERM GOAL #1   Title Patient I in advanced HEP (08/05/15)   Time 8   Period Weeks   Status New   PT LONG TERM GOAL #2   Title Increase core strength allowing patient to progress with stabilizatioin exercises (08/05/15)   Time 8   Period Weeks   Status New   PT LONG TERM GOAL #3   Title Increased bilat hip extension strength to 5/5 (08/05/15)   Time 8   Period Weeks   Status New   PT LONG TERM GOAL #4   Title Establish FOTO goal (08/05/15)   Time 8   Period Weeks   Status New       Problem List Patient Active Problem List   Diagnosis Date Noted  . Chondromalacia of right patellofemoral joint 05/10/2015  . Lumbar spondylosis 05/10/2015  . Postmenopausal atrophic vaginitis 12/18/2014  . COPD, mild 03/30/2014  . Generalized anxiety disorder 01/14/2012  . HEARING DEFICIT 05/29/2011  . PANIC  DISORDER 08/12/2010  . LOW BACK PAIN, CHRONIC 07/16/2010  . PELVIC FRACTURE 04/15/2010  . DECREASED LIBIDO 06/26/2009  . Major depressive disorder, recurrent episode, severe 12/03/2008  . SCOLIOSIS 11/06/2008  . Osteopenia  09/25/2008  . POSTMENOPAUSAL STATUS 09/11/2008  . Hyperlipidemia 05/28/2008  . ARTHRITIS, HIPS, BILATERAL 02/21/2008  . APHTHOUS ULCERS 11/11/2007  . Hypothyroidism 10/05/2007  . URINARY TRACT INFECTION, RECURRENT 07/18/2007  . SACROILIITIS, RIGHT 07/18/2007    Cristina Mattern Rober Minion, PT, MPH 06/10/2015, 11:29 AM  [facility] [address] [place], [place], [address] Phone: [phone]   Fax:  [phone]

## 2015-06-10 NOTE — Patient Instructions (Signed)
Abdominal Bracing With Pelvic Floor (Hook-Lying)   With neutral spine, tighten pelvic floor and abdominals(suck your belly button to your back bone), tighten muscles in your back like you are lifting your back slightly. Hold 10 seconds. Repeat _10__ times. 1-3 sets. Do _several__ times a day.   KNEE: Quadriceps - Prone   Place strap around ankle. Bring ankle toward buttocks. Press hip into surface. Hold _20__ seconds. _3__ reps per set, _2__ sets per day   Back Wall Slide   With feet __10=12__ inches from wall, lean as much of back against the wall as possible. Gently squat down __10-12_ inches, keeping back against wall.  Hold _20___ seconds while counting out loud. Repeat __10_to 20_ times. Do __2-3__ sessions per day.   Do NOT stand with knees locked!!!  .

## 2015-06-14 ENCOUNTER — Telehealth: Payer: Self-pay | Admitting: Sports Medicine

## 2015-06-14 ENCOUNTER — Ambulatory Visit (INDEPENDENT_AMBULATORY_CARE_PROVIDER_SITE_OTHER): Payer: 59 | Admitting: Physical Therapy

## 2015-06-14 DIAGNOSIS — M545 Low back pain, unspecified: Secondary | ICD-10-CM

## 2015-06-14 DIAGNOSIS — R531 Weakness: Secondary | ICD-10-CM

## 2015-06-14 DIAGNOSIS — M2560 Stiffness of unspecified joint, not elsewhere classified: Secondary | ICD-10-CM

## 2015-06-14 DIAGNOSIS — R6889 Other general symptoms and signs: Secondary | ICD-10-CM

## 2015-06-14 DIAGNOSIS — Z7409 Other reduced mobility: Secondary | ICD-10-CM

## 2015-06-14 NOTE — Telephone Encounter (Signed)
Probably best to do the physical therapy because if she improves then she doesn't need an MRI, and thus would not have to pay for an MRI.

## 2015-06-14 NOTE — Therapy (Signed)
[facility] [place] [address] [place], [place], [address] Phone: [phone]   Fax:  [phone]  Physical Therapy Treatment  Patient Details  Name: [name] MRN: [id number] Date of Birth: 31-Aug-1956 Referring Provider:  Monica Becton,*  Encounter Date: 06/14/2015      PT End of Session - 06/14/15 0935    Visit Number 2   Number of Visits 16   Date for PT Re-Evaluation 08/05/15   PT Start Time 0935   PT Stop Time 1016   PT Time Calculation (min) 41 min      Past Medical History  Diagnosis Date  . Hypercholesterolemia   . Arthritis     Right Hip  . Hypothyroidism   . Chronic cystitis   . Dyspareunia   . Breast disorder      Rt Breast Calcifications  . Depression   . Anxiety   . Plantar fasciitis of left foot     2013    Past Surgical History  Procedure Laterality Date  . Scoliosis repair with harrington rods    . Breast enhancement surgery    . Plastic surgry to eyes      There were no vitals filed for this visit.  Visit Diagnosis:  Midline low back pain without sciatica  Stiffness in joint  Weakness generalized  Decreased strength, endurance, and mobility      Subjective Assessment - 06/14/15 0937    Subjective Pt reports her Rt knee is starting to bother her more. Going to see MD as he wants to perform an MRI on her knee as she may need surgery   Currently in Pain? Yes   Pain Score 2    Pain Location Back   Pain Orientation Lower   Pain Descriptors / Indicators Dull;Tightness   Pain Type Chronic pain   Pain Onset More than a month ago   Pain Relieving Factors bought home TENS                         [facility] Adult PT Treatment/Exercise - 06/14/15 0001    Lumbar Exercises: Aerobic   Stationary Bike Nustep L5 x 5'   Lumbar Exercises: Standing   Wall Slides 10 reps  then 10 reps with FWD reach holding 4# wt   Wall Slides Limitations poor eccentric quad   Lumbar  Exercises: Supine   Ab Set 10 reps   Clam 10 reps  single leg, then bilat.   Bridge 20 reps   Bridge Limitations then 4x5 bridge with resisted clams, green band    Straight Leg Raise --  3x10 with ER and 3 part core   Other Supine Lumbar Exercises leg lengthener to stretch out hip flexors   Lumbar Exercises: Prone   Other Prone Lumbar Exercises 10 reps pelvic presses                  PT Short Term Goals - 06/10/15 1111    PT SHORT TERM GOAL #1   Title Paient I in initial HEP (06/25/15)   Time 2   Period Weeks   Status New   PT SHORT TERM GOAL #2   Title Patient to demonstrate good core contraction (07/01/15)   Time 3   Period Weeks   Status New   PT SHORT TERM GOAL #3   Title Initiate lumbar stabilization program (06/25/15)   Time 2   Period Weeks   Status New  PT Long Term Goals - 06/10/15 1115    PT LONG TERM GOAL #1   Title Patient I in advanced HEP (08/05/15)   Time 8   Period Weeks   Status New   PT LONG TERM GOAL #2   Title Increase core strength allowing patient to progress with stabilizatioin exercises (08/05/15)   Time 8   Period Weeks   Status New   PT LONG TERM GOAL #3   Title Increased bilat hip extension strength to 5/5 (08/05/15)   Time 8   Period Weeks   Status New   PT LONG TERM GOAL #4   Title Establish FOTO goal (08/05/15)   Time 8   Period Weeks   Status New               Plan - 06/14/15 1007    Clinical Impression Statement Pt needs progression of core strengthening and hips.  She is weak in these areas.  Patient also demo's some impaired posture while performing exercise and requires VC for form.    Pt will benefit from skilled therapeutic intervention in order to improve on the following deficits Decreased range of motion;Decreased activity tolerance;Pain;Improper body mechanics;Decreased mobility;Decreased strength;Postural dysfunction   Rehab Potential Good   PT Frequency 2x / week   PT  Treatment/Interventions ADLs/Self Care Home Management;Cryotherapy;Electrical Stimulation;Moist Heat;Iontophoresis 4mg /ml Dexamethasone;Traction;Ultrasound;Gait training;Functional mobility training;Therapeutic exercise;Therapeutic activities;Neuromuscular re-education;Patient/family education;Manual techniques;Passive range of motion   PT Next Visit Plan Review exercises; progress with core stabilization and strengthening   Recommended Other Services Pt declined ice today, doesn't feel she needs it.    Consulted and Agree with Plan of Care Patient        Problem List Patient Active Problem List   Diagnosis Date Noted  . Chondromalacia of right patellofemoral joint 05/10/2015  . Lumbar spondylosis 05/10/2015  . Postmenopausal atrophic vaginitis 12/18/2014  . COPD, mild 03/30/2014  . Generalized anxiety disorder 01/14/2012  . HEARING DEFICIT 05/29/2011  . PANIC DISORDER 08/12/2010  . LOW BACK PAIN, CHRONIC 07/16/2010  . PELVIC FRACTURE 04/15/2010  . DECREASED LIBIDO 06/26/2009  . Major depressive disorder, recurrent episode, severe 12/03/2008  . SCOLIOSIS 11/06/2008  . Osteopenia 09/25/2008  . POSTMENOPAUSAL STATUS 09/11/2008  . Hyperlipidemia 05/28/2008  . ARTHRITIS, HIPS, BILATERAL 02/21/2008  . APHTHOUS ULCERS 11/11/2007  . Hypothyroidism 10/05/2007  . URINARY TRACT INFECTION, RECURRENT 07/18/2007  . SACROILIITIS, RIGHT 07/18/2007    Roderic Scarce, PT 06/14/2015, 10:17 AM  [facility] [address] [place], [place], [address] Phone: [phone]   Fax:  [phone]

## 2015-06-14 NOTE — Telephone Encounter (Signed)
Pt came in the office on 06/14/15 and was wanting to know if you are going to request the mri now or after she completes PT.

## 2015-06-17 ENCOUNTER — Ambulatory Visit (INDEPENDENT_AMBULATORY_CARE_PROVIDER_SITE_OTHER): Payer: 59 | Admitting: Physical Therapy

## 2015-06-17 DIAGNOSIS — Z7409 Other reduced mobility: Secondary | ICD-10-CM

## 2015-06-17 DIAGNOSIS — R531 Weakness: Secondary | ICD-10-CM | POA: Diagnosis not present

## 2015-06-17 DIAGNOSIS — M2560 Stiffness of unspecified joint, not elsewhere classified: Secondary | ICD-10-CM | POA: Diagnosis not present

## 2015-06-17 DIAGNOSIS — M545 Low back pain, unspecified: Secondary | ICD-10-CM

## 2015-06-17 DIAGNOSIS — R6889 Other general symptoms and signs: Secondary | ICD-10-CM

## 2015-06-17 NOTE — Therapy (Signed)
[facility] [place] [address] [place], [place], [address] Phone: [phone]   Fax:  [phone]  Physical Therapy Treatment  Patient Details  Name: [name] MRN: [id number] Date of Birth: [birth year] Referring Provider:  Monica Becton,*  Encounter Date: 06/17/2015      PT End of Session - 06/17/15 1409    Visit Number 3   Number of Visits 16   Date for PT Re-Evaluation 08/05/15   PT Start Time 1405   PT Stop Time 1505   PT Time Calculation (min) 60 min   Activity Tolerance Patient tolerated treatment well      Past Medical History  Diagnosis Date  . Hypercholesterolemia   . Arthritis     Right Hip  . Hypothyroidism   . Chronic cystitis   . Dyspareunia   . Breast disorder      Rt Breast Calcifications  . Depression   . Anxiety   . Plantar fasciitis of left foot     2013    Past Surgical History  Procedure Laterality Date  . Scoliosis repair with harrington rods    . Breast enhancement surgery    . Plastic surgry to eyes      There were no vitals filed for this visit.  Visit Diagnosis:  Midline low back pain without sciatica  Stiffness in joint  Weakness generalized  Decreased strength, endurance, and mobility      Subjective Assessment - 06/17/15 1406    Subjective Pt reports she sat for 4 hours on Saturday scrapbooking and experienced increased back pain. "could barely walk" Sunday.  Took hydrocodone and iced back for pain this wkend.    Currently in Pain? No/denies  took Aleve prior to appt due to 2/10 pain; now resolved.             [facility] PT Assessment - 06/17/15 0001    Assessment   Medical Diagnosis LBP/knee pain   Onset Date/Surgical Date 12/05/15   Hand Dominance Left   Next MD Visit no appt scheduled   Prior Therapy here ~ 5 months ago                     [facility] Adult PT Treatment/Exercise - 06/17/15 0001    Lumbar Exercises: Stretches   Passive  Hamstring Stretch 2 reps;20 seconds  each leg    Double Knee to Chest Stretch 1 rep;20 seconds   Hip Flexor Stretch 1 rep;30 seconds  RLE after ER SLR   ITB Stretch 1 rep;20 seconds   Piriformis Stretch 2 reps;20 seconds   Lumbar Exercises: Aerobic   Stationary Bike NuStep L4: 5 min    Lumbar Exercises: Standing   Wall Slides 10 reps   Wall Slides Limitations weight shifts Lt due to Rt knee pain   Lumbar Exercises: Supine   Ab Set 5 reps  10 sec; tactile cues needed.    Bridge 10 reps;5 seconds  with ball squeeze   Straight Leg Raise 10 reps  with core engagement, ER of LE, 1 set each leg   Other Supine Lumbar Exercises Trans abd: with hip out/in x 10 reps each leg; heel slides x 10 each leg , marching x 20 steps    Lumbar Exercises: Sidelying   Clam 10 reps  2 sets, each side   Lumbar Exercises: Prone   Other Prone Lumbar Exercises 10 reps pelvic press x 5 sec hold, required tactile cues for proper engagement.  Modalities   Modalities Cryotherapy   Cryotherapy   Number Minutes Cryotherapy 12 Minutes   Cryotherapy Location Lumbar Spine   Type of Cryotherapy Ice massage                PT Education - 06/17/15 1459    Education provided Yes   Education Details Added ITB stretch to routine. Recommended LTR in bed prior to getting up for day to limber up, and changing positions every 60min-1hr when scrapbooking.    Person(s) Educated Patient   Methods Explanation;Handout   Comprehension Returned demonstration;Verbalized understanding          PT Short Term Goals - 06/10/15 1111    PT SHORT TERM GOAL #1   Title Paient I in initial HEP (06/25/15)   Time 2   Period Weeks   Status New   PT SHORT TERM GOAL #2   Title Patient to demonstrate good core contraction (07/01/15)   Time 3   Period Weeks   Status New   PT SHORT TERM GOAL #3   Title Initiate lumbar stabilization program (06/25/15)   Time 2   Period Weeks   Status New           PT Long Term  Goals - 06/10/15 1115    PT LONG TERM GOAL #1   Title Patient I in advanced HEP (08/05/15)   Time 8   Period Weeks   Status New   PT LONG TERM GOAL #2   Title Increase core strength allowing patient to progress with stabilizatioin exercises (08/05/15)   Time 8   Period Weeks   Status New   PT LONG TERM GOAL #3   Title Increased bilat hip extension strength to 5/5 (08/05/15)   Time 8   Period Weeks   Status New   PT LONG TERM GOAL #4   Title Establish FOTO goal (08/05/15)   Time 8   Period Weeks   Status New               Plan - 06/17/15 1412    Clinical Impression Statement Pt voiced that she is attempting to incorporate exercises into her everyday routine (ie: wall slides while brushing teeth), demonstrating improved compliance. Pt required some tactile and verbal cues for transverse abdominus and multifidus engagement. Progressing towards goals.    Pt will benefit from skilled therapeutic intervention in order to improve on the following deficits Decreased range of motion;Decreased activity tolerance;Pain;Improper body mechanics;Decreased mobility;Decreased strength;Postural dysfunction   Rehab Potential Good   PT Frequency 2x / week   PT Duration 8 weeks   PT Treatment/Interventions ADLs/Self Care Home Management;Cryotherapy;Electrical Stimulation;Moist Heat;Iontophoresis /ml Dexamethasone;Traction;Ultrasound;Gait training;Functional mobility training;Therapeutic exercise;Therapeutic activities;Neuromuscular re-education;Patient/family education;Manual techniques;Passive range of motion   PT Next Visit Plan Continue progressive core strengthening.  Try seated core on ball exercises/ further prone press series.    Consulted and Agree with Plan of Care Patient        Problem List Patient Active Problem List   Diagnosis Date Noted  . Chondromalacia of right patellofemoral joint 05/10/2015  . Lumbar spondylosis 05/10/2015  . Postmenopausal atrophic vaginitis  12/18/2014  . COPD, mild 03/30/2014  . Generalized anxiety disorder 01/14/2012  . HEARING DEFICIT 05/29/2011  . PANIC DISORDER 08/12/2010  . LOW BACK PAIN, CHRONIC 07/16/2010  . PELVIC FRACTURE 04/15/2010  . DECREASED LIBIDO 06/26/2009  . Major depressive disorder, recurrent episode, severe 12/03/2008  . SCOLIOSIS 11/06/2008  . Osteopenia 09/25/2008  . POSTMENOPAUSAL STATUS 09/11/2008  .  Hyperlipidemia 05/28/2008  . ARTHRITIS, HIPS, BILATERAL 02/21/2008  . APHTHOUS ULCERS 11/11/2007  . Hypothyroidism 10/05/2007  . URINARY TRACT INFECTION, RECURRENT 07/18/2007  . SACROILIITIS, RIGHT 07/18/2007   Mayer CamelJennifer Carlson-Long, PTA 06/17/2015 3:10 PM  [facility] [address] [place], [place], [address] Phone: [phone]   Fax:  [phone]

## 2015-06-17 NOTE — Patient Instructions (Signed)
Outer Hip Stretch: Reclined IT Band Stretch (Strap)   Strap around opposite foot, pull across only as far as possible with shoulders on mat. Hold for __30__ seconds. Repeat __2__ times, then switch legs.   [facility] [address] [place], [place] [address]  [phone] (office) [phone] (fax)

## 2015-06-21 ENCOUNTER — Ambulatory Visit (INDEPENDENT_AMBULATORY_CARE_PROVIDER_SITE_OTHER): Payer: 59 | Admitting: Physical Therapy

## 2015-06-21 DIAGNOSIS — Z7409 Other reduced mobility: Secondary | ICD-10-CM | POA: Diagnosis not present

## 2015-06-21 DIAGNOSIS — M545 Low back pain, unspecified: Secondary | ICD-10-CM

## 2015-06-21 DIAGNOSIS — M2560 Stiffness of unspecified joint, not elsewhere classified: Secondary | ICD-10-CM

## 2015-06-21 DIAGNOSIS — R6889 Other general symptoms and signs: Secondary | ICD-10-CM

## 2015-06-21 DIAGNOSIS — R531 Weakness: Secondary | ICD-10-CM

## 2015-06-21 NOTE — Patient Instructions (Signed)
DEVELOPMENTAL POSITION: Quadruped Alternate Hip Extension   Shift weight to one side and raise opposite leg. Keep trunk steady. _10__ reps per set, __1_ sets per day, __3_ days per week Repeat with other leg.  Healthy Back Strengthening - Back Extension on All Fours   Start on hands and knees, keeping them apart. Straighten right leg and left arm at the same time. Hold __1__ seconds. Switch immediately and repeat with left leg and right arm. Do _10___ times. Increase repetitions gradually up to _2 sets of 10___. Increase each hold gradually up to _5___ seconds.   [facility] [address] [place], [place] [address]  [phone] (office) [phone] (fax)

## 2015-06-21 NOTE — Therapy (Signed)
[facility] [place] [address] [place], [place], [address] Phone: [phone]   Fax:  [phone]  Physical Therapy Treatment  Patient Details  Name: [name] MRN: [id number] Date of Birth: [birth year] Referring Provider:  Monica Becton,*  Encounter Date: 06/21/2015      PT End of Session - 06/21/15 1413    Visit Number 4   Number of Visits 16   Date for PT Re-Evaluation 08/05/15   PT Start Time 1412   PT Stop Time 1444   PT Time Calculation (min) 32 min   Activity Tolerance Patient tolerated treatment well;No increased pain      Past Medical History  Diagnosis Date  . Hypercholesterolemia   . Arthritis     Right Hip  . Hypothyroidism   . Chronic cystitis   . Dyspareunia   . Breast disorder      Rt Breast Calcifications  . Depression   . Anxiety   . Plantar fasciitis of left foot     2013    Past Surgical History  Procedure Laterality Date  . Scoliosis repair with harrington rods    . Breast enhancement surgery    . Plastic surgry to eyes      There were no vitals filed for this visit.  Visit Diagnosis:  Midline low back pain without sciatica  Stiffness in joint  Weakness generalized  Decreased strength, endurance, and mobility      Subjective Assessment - 06/21/15 1413    Subjective Pt reports she had been doing a lot of walking and standing, increased back pain to 3/10. Took pain pill 1 hr prior to therapy today.  She reports that icing has given her relief temporarily and stretches minimal relief.     Currently in Pain? No/denies            [facility] PT Assessment - 06/21/15 0001    Assessment   Medical Diagnosis LBP/knee pain   Onset Date/Surgical Date 12/05/15   Hand Dominance Left   Next MD Visit no appt scheduled   Prior Therapy here ~ 5 months ago                     [facility] Adult PT Treatment/Exercise - 06/21/15 0001    Lumbar Exercises: Stretches   Passive  Hamstring Stretch 2 reps;30 seconds   Lumbar Exercises: Aerobic   Stationary Bike NuStep L4: 5 min    Lumbar Exercises: Standing   Other Standing Lumbar Exercises Wall mini squat (stationary) with core engagement and arms out and in with 3# wt x 10 x 2 sets.     Other Standing Lumbar Exercises Standing anti-rotation with core engagement + red band arms out/in x 10 x 2 sets each side.    Lumbar Exercises: Quadruped   [place] 5 reps   Straight Leg Raise 10 reps  each side   Opposite Arm/Leg Raise 10 reps;1 second;Right arm/Left leg;Left arm/Right leg   Other Quadruped Lumbar Exercises childs pose with hips leaning Lt/ Rt.                   PT Short Term Goals - 06/10/15 1111    PT SHORT TERM GOAL #1   Title Paient I in initial HEP (06/25/15)   Time 2   Period Weeks   Status New   PT SHORT TERM GOAL #2   Title Patient to demonstrate good core contraction (07/01/15)   Time 3   Period  Weeks   Status New   PT SHORT TERM GOAL #3   Title Initiate lumbar stabilization program (06/25/15)   Time 2   Period Weeks   Status New           PT Long Term Goals - 06/10/15 1115    PT LONG TERM GOAL #1   Title Patient I in advanced HEP (08/05/15)   Time 8   Period Weeks   Status New   PT LONG TERM GOAL #2   Title Increase core strength allowing patient to progress with stabilizatioin exercises (08/05/15)   Time 8   Period Weeks   Status New   PT LONG TERM GOAL #3   Title Increased bilat hip extension strength to 5/5 (08/05/15)   Time 8   Period Weeks   Status New   PT LONG TERM GOAL #4   Title Establish FOTO goal (08/05/15)   Time 8   Period Weeks   Status New               Plan - 06/21/15 1446    Clinical Impression Statement Pt tolerated all exercises without increase in back pain.  Pt demo hamstring fatigue and decreased balance with quadruped exercise (challenging).   Treatment limited due to pt's late arrival.  Pt progressing towards goals.     Rehab Potential Good   PT Frequency 2x / week   PT Duration 8 weeks   PT Treatment/Interventions ADLs/Self Care Home Management;Cryotherapy;Electrical Stimulation;Moist Heat;Iontophoresis 4mg /ml Dexamethasone;Traction;Ultrasound;Gait training;Functional mobility training;Therapeutic exercise;Therapeutic activities;Neuromuscular re-education;Patient/family education;Manual techniques;Passive range of motion   PT Next Visit Plan Continue progressive core strengthening.  Try seated core on ball exercises/ further prone press series.    PT Home Exercise Plan Pt encouraged to hold off on pain medicine prior to treatment to assess true response to exercises.    Consulted and Agree with Plan of Care Patient        Problem List Patient Active Problem List   Diagnosis Date Noted  . Chondromalacia of right patellofemoral joint 05/10/2015  . Lumbar spondylosis 05/10/2015  . Postmenopausal atrophic vaginitis 12/18/2014  . COPD, mild 03/30/2014  . Generalized anxiety disorder 01/14/2012  . HEARING DEFICIT 05/29/2011  . PANIC DISORDER 08/12/2010  . LOW BACK PAIN, CHRONIC 07/16/2010  . PELVIC FRACTURE 04/15/2010  . DECREASED LIBIDO 06/26/2009  . Major depressive disorder, recurrent episode, severe 12/03/2008  . SCOLIOSIS 11/06/2008  . Osteopenia 09/25/2008  . POSTMENOPAUSAL STATUS 09/11/2008  . Hyperlipidemia 05/28/2008  . ARTHRITIS, HIPS, BILATERAL 02/21/2008  . APHTHOUS ULCERS 11/11/2007  . Hypothyroidism 10/05/2007  . URINARY TRACT INFECTION, RECURRENT 07/18/2007  . SACROILIITIS, RIGHT 07/18/2007   Mayer CamelJennifer Carlson-Long, PTA 06/21/2015 2:56 PM    [facility] [place] [address] [place], [place], [address] Phone: [phone]   Fax:  [phone]

## 2015-06-27 ENCOUNTER — Telehealth: Payer: Self-pay | Admitting: *Deleted

## 2015-06-27 NOTE — Telephone Encounter (Signed)
Pt called and stated that she is experiencing bowel leakage and loose stools. This has been going on for about 3 mos. She has to use a pad to control the leakage. Told her that she will need to be seen for this. appt made for 7.22.2016 @ 1 pm..Caralee Morea, [place]a [name]

## 2015-06-28 ENCOUNTER — Encounter: Payer: Self-pay | Admitting: Physical Therapy

## 2015-07-01 ENCOUNTER — Ambulatory Visit (INDEPENDENT_AMBULATORY_CARE_PROVIDER_SITE_OTHER): Payer: 59 | Admitting: Physical Therapy

## 2015-07-01 DIAGNOSIS — M2560 Stiffness of unspecified joint, not elsewhere classified: Secondary | ICD-10-CM | POA: Diagnosis not present

## 2015-07-01 DIAGNOSIS — R531 Weakness: Secondary | ICD-10-CM

## 2015-07-01 DIAGNOSIS — M545 Low back pain, unspecified: Secondary | ICD-10-CM

## 2015-07-01 DIAGNOSIS — R6889 Other general symptoms and signs: Secondary | ICD-10-CM

## 2015-07-01 DIAGNOSIS — Z7409 Other reduced mobility: Secondary | ICD-10-CM

## 2015-07-01 NOTE — Therapy (Addendum)
[place] [address], [place], [address] Phone: [phone]   Fax:  [phone]  Physical Therapy Treatment  Patient Details  Name: [name] MRN: [id number] Date of Birth: Jun 01, 1956 Referring Provider:  Silverio Decamp,*  Encounter Date: 07/01/2015      PT End of Session - 07/01/15 1434    Visit Number 5   Number of Visits 16   Date for PT Re-Evaluation 08/05/15   PT Start Time 1594   PT Stop Time 1525   PT Time Calculation (min) 54 min   Activity Tolerance Patient limited by pain      Past Medical History  Diagnosis Date  . Hypercholesterolemia   . Arthritis     Right Hip  . Hypothyroidism   . Chronic cystitis   . Dyspareunia   . Breast disorder      Rt Breast Calcifications  . Depression   . Anxiety   . Plantar fasciitis of left foot     2013    Past Surgical History  Procedure Laterality Date  . Scoliosis repair with harrington rods    . Breast enhancement surgery    . Plastic surgry to eyes      There were no vitals filed for this visit.  Visit Diagnosis:  Midline low back pain without sciatica  Stiffness in joint  Weakness generalized  Decreased strength, endurance, and mobility      Subjective Assessment - 07/01/15 1435    Subjective Pt tearful upon arrival.  Pt reported she had 1/10 back pain upon waking, but with cleaning house (on knees cleaning toilet/tub and vacuuming) pain shot up to 6/10.    Currently in Pain? Yes   Pain Score 6    Pain Location Back   Pain Orientation Right   Pain Descriptors / Indicators Burning;Spasm;Sharp   Aggravating Factors  bending, cleaning.    Pain Relieving Factors laying down, ice, pain medicine             [facility] PT Assessment - 07/01/15 0001    Assessment   Medical Diagnosis LBP/knee pain   Onset Date/Surgical Date 12/05/15   Hand Dominance Left                     [facility] Adult PT Treatment/Exercise  - 07/01/15 0001    Self-Care   Self-Care Posture   Posture Discussed body mechanics with vacuuming/sweeping/ cleaing. Provided demo.    Lumbar Exercises: Stretches   Passive Hamstring Stretch 2 reps;30 seconds   Piriformis Stretch 2 reps;20 seconds  each side    Lumbar Exercises: Supine   Clam 10 reps  with trans abd; each leg    Heel Slides 10 reps  with trans abd; each leg   Bridge 10 reps;5 seconds   Lumbar Exercises: Sidelying   Clam 10 reps  2 sets, each side   Lumbar Exercises: Quadruped   Madcat/Old Horse 5 reps   Straight Leg Raise 10 reps  each side   Straight Leg Raises Limitations caused wrist discomfort.    Other Quadruped Lumbar Exercises childs pose with hips leaning Lt/ Rt.    Modalities   Modalities Cryotherapy;Electrical Stimulation   Cryotherapy   Number Minutes Cryotherapy 15 Minutes   Cryotherapy Location Lumbar Spine   Type of Cryotherapy Ice pack   Electrical Stimulation   Electrical Stimulation Location Lumbar spine    Electrical Stimulation Action IFC    Electrical Stimulation Parameters to tolerance  Electrical Stimulation Goals Pain                  PT Short Term Goals - 06/10/15 1111    PT SHORT TERM GOAL #1   Title Paient I in initial HEP (06/25/15)   Time 2   Period Weeks   Status New   PT SHORT TERM GOAL #2   Title Patient to demonstrate good core contraction (07/01/15)   Time 3   Period Weeks   Status New   PT SHORT TERM GOAL #3   Title Initiate lumbar stabilization program (06/25/15)   Time 2   Period Weeks   Status New           PT Long Term Goals - 06/10/15 1115    PT LONG TERM GOAL #1   Title Patient I in advanced HEP (08/05/15)   Time 8   Period Weeks   Status New   PT LONG TERM GOAL #2   Title Increase core strength allowing patient to progress with stabilizatioin exercises (08/05/15)   Time 8   Period Weeks   Status New   PT LONG TERM GOAL #3   Title Increased bilat hip extension strength to 5/5  (08/05/15)   Time 8   Period Weeks   Status New   PT LONG TERM GOAL #4   Title Establish FOTO goal (08/05/15)   Time 8   Period Weeks   Status New               Plan - 07/01/15 1701    Clinical Impression Statement Pt initially reported 6/10 pain upon arrival. With exercise and ice/estim, lowered pain to 4/10.  Pt tolerated all exercises this date.  Reviewed improving body mechanics to prevent further injury. No goals met this visit.    Pt will benefit from skilled therapeutic intervention in order to improve on the following deficits Decreased range of motion;Decreased activity tolerance;Pain;Improper body mechanics;Decreased mobility;Decreased strength;Postural dysfunction   Rehab Potential Good   PT Frequency 2x / week   PT Duration 8 weeks   PT Treatment/Interventions ADLs/Self Care Home Management;Cryotherapy;Electrical Stimulation;Moist Heat;Iontophoresis 45m/ml Dexamethasone;Traction;Ultrasound;Gait training;Functional mobility training;Therapeutic exercise;Therapeutic activities;Neuromuscular re-education;Patient/family education;Manual techniques;Passive range of motion   PT Next Visit Plan Continue progressive core strengthening.  Try seated core on ball exercises/ further prone press series.    Consulted and Agree with Plan of Care Patient        Problem List Patient Active Problem List   Diagnosis Date Noted  . Chondromalacia of right patellofemoral joint 05/10/2015  . Lumbar spondylosis 05/10/2015  . Postmenopausal atrophic vaginitis 12/18/2014  . COPD, mild 03/30/2014  . Generalized anxiety disorder 01/14/2012  . HEARING DEFICIT 05/29/2011  . PANIC DISORDER 08/12/2010  . LOW BACK PAIN, CHRONIC 07/16/2010  . PELVIC FRACTURE 04/15/2010  . DECREASED LIBIDO 06/26/2009  . Major depressive disorder, recurrent episode, severe 12/03/2008  . SCOLIOSIS 11/06/2008  . Osteopenia 09/25/2008  . POSTMENOPAUSAL STATUS 09/11/2008  . Hyperlipidemia 05/28/2008  .  ARTHRITIS, HIPS, BILATERAL 02/21/2008  . APHTHOUS ULCERS 11/11/2007  . Hypothyroidism 10/05/2007  . URINARY TRACT INFECTION, RECURRENT 07/18/2007  . SACROILIITIS, RIGHT 07/18/2007   JKerin Perna PTA 07/01/2015 5:03 PM  [place] [address] [place] [address]Phone: [phone]  Fax:  [phone]    PHYSICAL THERAPY DISCHARGE SUMMARY  Visits from Start of Care: 5  Current functional level related to goals / functional outcomes: unknown   Remaining deficits:  unknown   Education / Equipment: HEP Plan: Patient agrees to discharge.  Patient goals were not met. Patient is being discharged due to the patient's request. Pt doesn't feel therapy is helping ?????   Jeral Pinch, PT 07/24/2015 10:27 AM

## 2015-07-05 ENCOUNTER — Ambulatory Visit: Payer: Self-pay | Admitting: Sports Medicine

## 2015-07-05 ENCOUNTER — Encounter: Payer: Self-pay | Admitting: Physical Therapy

## 2015-07-08 ENCOUNTER — Ambulatory Visit (INDEPENDENT_AMBULATORY_CARE_PROVIDER_SITE_OTHER): Payer: 59 | Admitting: Sports Medicine

## 2015-07-08 ENCOUNTER — Ambulatory Visit: Payer: Self-pay | Admitting: Sports Medicine

## 2015-07-08 ENCOUNTER — Encounter: Payer: Self-pay | Admitting: Physical Therapy

## 2015-07-08 ENCOUNTER — Encounter: Payer: Self-pay | Admitting: Sports Medicine

## 2015-07-08 DIAGNOSIS — M2241 Chondromalacia patellae, right knee: Secondary | ICD-10-CM

## 2015-07-08 DIAGNOSIS — M47896 Other spondylosis, lumbar region: Secondary | ICD-10-CM

## 2015-07-08 NOTE — Progress Notes (Signed)
  Subjective:    CC: follow-up  HPI: Right knee osteoarthritis: Lateral compartment, has failed injection. Minimal mechanical symptoms.  Low back pain: With a history of scoliosis post instrumentation, she also has multilevel lumbar degenerative disc disease in the lower facet arthritis, pain is predominantly discogenic and worse with flexion Valsalva and riding in a car.  Past medical history, Surgical history, Family history not pertinant except as noted below, Social history, Allergies, and medications have been entered into the medical record, reviewed, and no changes needed.   Review of Systems: No fevers, chills, night sweats, weight loss, chest pain, or shortness of breath.   Objective:    General: Well Developed, well nourished, and in no acute distress.  Neuro: Alert and oriented x3, extra-ocular muscles intact, sensation grossly intact.  HEENT: Normocephalic, atraumatic, pupils equal round reactive to light, neck supple, no masses, no lymphadenopathy, thyroid nonpalpable.  Skin: Warm and dry, no rashes. Cardiac: Regular rate and rhythm, no murmurs rubs or gallops, no lower extremity edema.  Respiratory: Clear to auscultation bilaterally. Not using accessory muscles, speaking in full sentences.  Impression and Recommendations:    I spent 25 minutes with this patient, greater than 50% was face-to-face time counseling regarding the above diagnoses, we showed her the x-ray images and explained what they meant.

## 2015-07-08 NOTE — Assessment & Plan Note (Signed)
Failed injection and 4-6 weeks of formal physical therapy. MR to further evaluate degree of tearing of the meniscus. Further treatment will depend on what we see on the MRI.

## 2015-07-08 NOTE — Assessment & Plan Note (Signed)
Persistent despite greater than 4 weeks of physician directed physical therapy. There is thoracic instrumentation however degenerative changes are most markedly at the L2-3 in the L3-4 levels from a discogenic standpoint and the lower lumbar spine guarding her facets. I do think pain is predominantly discogenic, MRI of the lumbar spine, return to go over images and for interventional planning.

## 2015-07-10 ENCOUNTER — Other Ambulatory Visit: Payer: Self-pay | Admitting: Family Medicine

## 2015-07-12 ENCOUNTER — Encounter: Payer: Self-pay | Admitting: Physical Therapy

## 2015-07-12 ENCOUNTER — Ambulatory Visit: Payer: Self-pay | Admitting: Family Medicine

## 2015-07-15 ENCOUNTER — Ambulatory Visit (INDEPENDENT_AMBULATORY_CARE_PROVIDER_SITE_OTHER): Payer: 59

## 2015-07-15 DIAGNOSIS — M4807 Spinal stenosis, lumbosacral region: Secondary | ICD-10-CM | POA: Diagnosis not present

## 2015-07-15 DIAGNOSIS — X58XXXA Exposure to other specified factors, initial encounter: Secondary | ICD-10-CM | POA: Diagnosis not present

## 2015-07-15 DIAGNOSIS — M4186 Other forms of scoliosis, lumbar region: Secondary | ICD-10-CM | POA: Diagnosis not present

## 2015-07-15 DIAGNOSIS — M47896 Other spondylosis, lumbar region: Secondary | ICD-10-CM

## 2015-07-15 DIAGNOSIS — S8331XA Tear of articular cartilage of right knee, current, initial encounter: Secondary | ICD-10-CM | POA: Diagnosis not present

## 2015-07-15 DIAGNOSIS — M7121 Synovial cyst of popliteal space [Baker], right knee: Secondary | ICD-10-CM

## 2015-07-15 DIAGNOSIS — M2241 Chondromalacia patellae, right knee: Secondary | ICD-10-CM

## 2015-07-18 ENCOUNTER — Ambulatory Visit: Payer: Self-pay | Admitting: Sports Medicine

## 2015-07-22 ENCOUNTER — Ambulatory Visit (INDEPENDENT_AMBULATORY_CARE_PROVIDER_SITE_OTHER): Payer: 59 | Admitting: Sports Medicine

## 2015-07-22 ENCOUNTER — Encounter: Payer: Self-pay | Admitting: Sports Medicine

## 2015-07-22 DIAGNOSIS — S83206D Unspecified tear of unspecified meniscus, current injury, right knee, subsequent encounter: Secondary | ICD-10-CM

## 2015-07-22 DIAGNOSIS — M4726 Other spondylosis with radiculopathy, lumbar region: Secondary | ICD-10-CM

## 2015-07-22 MED ORDER — OXYCODONE-ACETAMINOPHEN 5-325 MG PO TABS: 1.0000 | ORAL_TABLET | Freq: Three times a day (TID) | ORAL | Status: DC | PRN

## 2015-07-22 NOTE — Assessment & Plan Note (Signed)
MRI shows multilevel degenerative disc disease that appears worst at the L5-S1 level with some left-sided eccentric disc protrusion. Pain is predominantly discogenic we are going to proceed with a left-sided L5-S1 interlaminar epidural. Per patient request I am going to give her a short course of oxycodone, for pain relief until the epidural.

## 2015-07-22 NOTE — Progress Notes (Signed)
  Subjective:    CC: Follow-up  HPI: Low back pain: With lumbar spondylosis, multiple levels, worst at the L5-S1 with left-sided foraminal stenosis, at this point she has failed formal physical therapy, steroids, NSAIDs, muscle relaxers. Amenable to proceed with epidural.  Knee pain: Complex meniscal tear, Baker's cyst, osteoarthritis, failed injection, physical therapy. NSAIDs. Understands that the next step is probably surgical intervention followed by viscous supplementation.  Past medical history, Surgical history, Family history not pertinant except as noted below, Social history, Allergies, and medications have been entered into the medical record, reviewed, and no changes needed.   Review of Systems: No fevers, chills, night sweats, weight loss, chest pain, or shortness of breath.   Objective:    General: Well Developed, well nourished, and in no acute distress.  Neuro: Alert and oriented x3, extra-ocular muscles intact, sensation grossly intact.  HEENT: Normocephalic, atraumatic, pupils equal round reactive to light, neck supple, no masses, no lymphadenopathy, thyroid nonpalpable.  Skin: Warm and dry, no rashes. Cardiac: Regular rate and rhythm, no murmurs rubs or gallops, no lower extremity edema.  Respiratory: Clear to auscultation bilaterally. Not using accessory muscles, speaking in full sentences.  Impression and Recommendations:    I spent 25 minutes with this patient, greater than 50% was face-to-face time counseling regarding the above diagnoses

## 2015-07-22 NOTE — Assessment & Plan Note (Addendum)
MRI did show several areas of pathology including a complex meniscal tear, Baker's cyst, and severe osteoarthritis. She has failed an injection as well as oral medications, as well as rehabilitation and do think she is now a candidate for operative arthroscopy. Referral to Dr. Luiz Blare. I do suspect she would be a candidate to at least try Visco supplementation after her arthroscopy.

## 2015-07-26 ENCOUNTER — Ambulatory Visit (INDEPENDENT_AMBULATORY_CARE_PROVIDER_SITE_OTHER): Payer: 59 | Admitting: Family Medicine

## 2015-07-26 ENCOUNTER — Encounter: Payer: Self-pay | Admitting: Family Medicine

## 2015-07-26 ENCOUNTER — Ambulatory Visit (HOSPITAL_COMMUNITY): Payer: Self-pay | Admitting: Psychiatry

## 2015-07-26 VITALS — BP 119/77 | HR 60 | Temp 98.5°F | Wt 166.0 lb

## 2015-07-26 DIAGNOSIS — R195 Other fecal abnormalities: Secondary | ICD-10-CM

## 2015-07-26 DIAGNOSIS — S46819A Strain of other muscles, fascia and tendons at shoulder and upper arm level, unspecified arm, initial encounter: Secondary | ICD-10-CM | POA: Diagnosis not present

## 2015-07-26 DIAGNOSIS — R197 Diarrhea, unspecified: Secondary | ICD-10-CM

## 2015-07-26 DIAGNOSIS — M542 Cervicalgia: Secondary | ICD-10-CM | POA: Diagnosis not present

## 2015-07-26 LAB — COMPLETE METABOLIC PANEL WITH GFR
ALT: 14 U/L (ref 6–29)
AST: 24 U/L (ref 10–35)
Albumin: 4.5 g/dL (ref 3.6–5.1)
Alkaline Phosphatase: 45 U/L (ref 33–130)
BILIRUBIN TOTAL: 0.6 mg/dL (ref 0.2–1.2)
BUN: 15 mg/dL (ref 7–25)
CO2: 27 mmol/L (ref 20–31)
Calcium: 10.1 mg/dL (ref 8.6–10.4)
Chloride: 102 mmol/L (ref 98–110)
Creat: 0.83 mg/dL (ref 0.50–1.05)
GFR, EST NON AFRICAN AMERICAN: 77 mL/min (ref >=60)
GFR, Est African American: 89 mL/min (ref >=60)
Glucose, Bld: 71 mg/dL (ref 65–99)
POTASSIUM: 3.9 mmol/L (ref 3.5–5.3)
Sodium: 139 mmol/L (ref 135–146)
Total Protein: 6.9 g/dL (ref 6.1–8.1)

## 2015-07-26 NOTE — Assessment & Plan Note (Signed)
Mild fecal incontinence. Start was celiac panel. Follow-up with PCP. Recommend trial of restricting lactose

## 2015-07-26 NOTE — Patient Instructions (Signed)
Thank you for coming in today. We will do labs today for celiac disease.  Get xray of neck today.  Follow up with Dr. Judie Petit in a few weeks if labs negative and you still have symptoms.  If your belly pain worsens, or you have high fever, bad vomiting, blood in your stool or black tarry stool go to the Emergency Room.

## 2015-07-26 NOTE — Assessment & Plan Note (Signed)
Patient declined cervical x-ray today. Plan for watchful waiting. Physical therapy as needed.

## 2015-07-26 NOTE — Progress Notes (Signed)
[name] is a 59 y.o. female who presents to [facility]  today for   1) loose stools. Patient is a 6 month history of loose stools. She notes that sometimes she has small amount of watery stool, when she tries to pass flatus. She try probiotic which have not helped. No abdominal pain nausea or vomiting. She notes that food and milk tends to worsen this. She has not tried a restrictive diet. She's concerned she may have celiac's disease.  2) shoulder pain: Patient has 2 month history of bilateral trapezius pain. This is mild and persistent in the morning when she wakes. It improves to the course the day. No radiating pain weakness or numbness fevers or chills.   Past Medical History  Diagnosis Date  . Hypercholesterolemia   . Arthritis     Right Hip  . Hypothyroidism   . Chronic cystitis   . Dyspareunia   . Breast disorder      Rt Breast Calcifications  . Depression   . Anxiety   . Plantar fasciitis of left foot     2013   Past Surgical History  Procedure Laterality Date  . Scoliosis repair with harrington rods    . Breast enhancement surgery    . Plastic surgry to eyes     History  Substance Use Topics  . Smoking status: Former Smoker    Quit date: 09/20/1982  . Smokeless tobacco: Never Used  . Alcohol Use: 0.0 oz/week    0.5 drink(s) per week     Comment: Use is 1-2 a month.   ROS as above Medications: Current Outpatient Prescriptions  Medication Sig Dispense Refill  . albuterol (PROAIR HFA) 108 (90 BASE) MCG/ACT inhaler Inhale 2 puffs into the lungs every 6 (six) hours as needed for wheezing or shortness of breath. Before exercise. 1 Inhaler 0  . busPIRone (BUSPAR) 15 MG tablet Take 1 tablet (15 mg total) by mouth as directed. Three tablets a day 270 tablet 0  . calcium carbonate (OS-CAL) 600 MG TABS tablet Take 600 mg by mouth daily with breakfast.    . calcium-vitamin D (OSCAL WITH D) 500-200 MG-UNIT per tablet Take 2 tablets  by mouth daily.     . cholecalciferol (VITAMIN D) 1000 UNITS tablet Take 1,000 Units by mouth 2 (two) times daily.    . clonazePAM (KLONOPIN) 0.5 MG tablet Take 1 tablet (0.5 mg total) by mouth daily as needed. 90 tablet 0  . FLUoxetine (PROZAC) 40 MG capsule   0  . HYDROcodone-acetaminophen (NORCO) 10-325 MG per tablet Take 1 tablet by mouth 2 (two) times daily as needed. 60 tablet 0  . levothyroxine (SYNTHROID, LEVOTHROID) 88 MCG tablet TAKE 1 TABLET (88 MCG TOTAL) BY MOUTH DAILY BEFORE BREAKFAST. 90 tablet 0  . meloxicam (MOBIC) 15 MG tablet One tab PO qAM with breakfast for 2 weeks, then daily prn pain. 30 tablet 3  . Multiple Vitamin (MULTIVITAMIN) tablet Take 1 tablet by mouth daily.      Marland Kitchen oxyCODONE-acetaminophen (PERCOCET/ROXICET) 5-325 MG per tablet Take 1 tablet by mouth every 8 (eight) hours as needed. 30 tablet 0   No current facility-administered medications for this visit.   Allergies  Allergen Reactions  . Bupropion Hcl     REACTION: irritabillity  . Sulfa Antibiotics Itching     Exam:  BP 119/77 mmHg  Pulse 60  Temp(Src) 98.5 F (36.9 C) (Oral)  Wt 166 lb (75.297 kg) Gen: Well NAD HEENT: EOMI,  MMM Lungs: Normal work of breathing. CTABL Heart: RRR no MRG Abd: NABS, Soft. Nondistended, Nontender Exts: Brisk capillary refill, warm and well perfused.  Neck: Nontender to midline. Tender palpation bilateral trapezius. Normal flexion extension rotation. It. Bilateral lateral flexion. Upper extremity strength reflexes and sensation are intact throughout.  No results found for this or any previous visit (from the past 24 hour(s)). No results found.   Please see individual assessment and plan sections.

## 2015-07-29 LAB — TISSUE TRANSGLUTAMINASE, IGA: TISSUE TRANSGLUTAMINASE AB, IGA: 1 U/mL (ref <4)

## 2015-07-29 LAB — GLIADIN ANTIBODIES, SERUM
GLIADIN IGG: 2 U (ref <20)
Gliadin IgA: 3 Units (ref <20)

## 2015-07-30 LAB — RETICULIN ANTIBODIES, IGA W TITER: Reticulin Ab, IgA: NEGATIVE

## 2015-07-30 NOTE — Progress Notes (Signed)
Quick Note:  Normal labs. No evidence of celiac disease. Follow up with PCP. ______

## 2015-08-02 ENCOUNTER — Ambulatory Visit (INDEPENDENT_AMBULATORY_CARE_PROVIDER_SITE_OTHER): Payer: 59 | Admitting: Psychiatry

## 2015-08-02 ENCOUNTER — Encounter (HOSPITAL_COMMUNITY): Payer: Self-pay | Admitting: Psychiatry

## 2015-08-02 VITALS — BP 122/80 | HR 80 | Ht 68.0 in | Wt 158.0 lb

## 2015-08-02 DIAGNOSIS — F419 Anxiety disorder, unspecified: Secondary | ICD-10-CM

## 2015-08-02 DIAGNOSIS — F411 Generalized anxiety disorder: Secondary | ICD-10-CM | POA: Diagnosis not present

## 2015-08-02 DIAGNOSIS — F332 Major depressive disorder, recurrent severe without psychotic features: Secondary | ICD-10-CM

## 2015-08-02 DIAGNOSIS — F331 Major depressive disorder, recurrent, moderate: Secondary | ICD-10-CM

## 2015-08-02 DIAGNOSIS — F0630 Mood disorder due to known physiological condition, unspecified: Secondary | ICD-10-CM

## 2015-08-02 DIAGNOSIS — Z634 Disappearance and death of family member: Secondary | ICD-10-CM | POA: Diagnosis not present

## 2015-08-02 MED ORDER — BUSPIRONE HCL 15 MG PO TABS: 15.0000 mg | ORAL_TABLET | Freq: Two times a day (BID) | ORAL | Status: DC

## 2015-08-02 MED ORDER — FLUOXETINE HCL 40 MG PO CAPS: 40.0000 mg | ORAL_CAPSULE | Freq: Every day | ORAL | Status: DC

## 2015-08-02 NOTE — Progress Notes (Signed)
Patient ID: [name], female   DOB: 05-24-1956, 59 y.o.   MRN: [id number]   [facility] Follow-up Outpatient Visit  Nichol Ator 03-Oct-1956  Date:08/02/2015  Chief Complaint:  Follow Up.  History of Chief Complaint:   HPI Comments: [name] is a 59 y/o female with a past psychiatric history significant for Major Depression, Recurrent severe, generalized anxiety and bereavement. The patient is referred for psychiatric services for medication management.    . Location:  Her depression dates back to 5 years ago when her brother was involved in drugs and she lost her mom. Those stressors are better now. Brother not using. She has supportive husband. Tolerating meds and she wanted to gradually get off some meds. NO panic attacks and worries are less bothersome. Decrease libido but otherwise no side effects.   Depression; near stable. Work keeps her busy. Bereavement: anniversary of her mom and dad is around mothers and fathers day month. That keeps her down during spring month. ongoing  She has cut down her buspirone to bid and have plenty left over. She also takes prozac 40 one day and 80 the next day. Anxiety: related to stress and anniversaries. meds help including buspirone.  . Severity: Depression: 5-7/10 (0=Very depressed; 5=Neutral; 10=Very Happy)  Anxiety- 0-1/10 (0=no anxiety; 5= moderate/tolerable anxiety; 10= panic attacks)  . Duration: The patient notes depression since her early 54's; and worsened since May 19, 2007 with the death of her parents.  She states her depression has improved  . Timing: Mood is worse n the afternoon at 5 PM.  . Context: Being late. Being afraid she will "screw up at work."  . Modifying factors: Mood improves with exercise.  . Associated signs and symptoms: Denies any recent episodes consistent with mania, particularly decreased need for sleep with increased energy, grandiosity, impulsivity, hyperverbal and pressured speech, or  increased productivity. Denies any recent symptoms consistent with psychosis, particularly auditory or visual hallucinations, thought broadcasting/insertion/withdrawal, or ideas of reference. Also denies excessive worry to the point of physical symptoms as well as any panic attacks. Denies any history of trauma or symptoms consistent with PTSD such as flashbacks, nightmares, hypervigilance, feelings of numbness or inability to connect with others.     Review of Systems  Constitutional: Negative for fever.  Cardiovascular: Negative for chest pain.  Skin: Negative for rash.  Psychiatric/Behavioral: Negative for suicidal ideas and substance abuse. The patient is not nervous/anxious.      Filed Vitals:   08/02/15 0905  BP: 122/80  Pulse: 80  Height: 5\' 8"  (1.727 m)  Weight: 158 lb (71.668 kg)   Physical Exam  Vitals reviewed.  Constitutional: She appears well-developed and well-nourished. No distress.  Skin: She is not diaphoretic.   Musculoskeletal: Strength & Muscle Tone: within normal limits Gait & Station: normal Patient leans: N/A   Past Medical History: Reviewed Past Medical History  Diagnosis Date  . Hypercholesterolemia   . Arthritis     Right Hip  . Hypothyroidism   . Chronic cystitis   . Dyspareunia   . Breast disorder      Rt Breast Calcifications  . Depression   . Anxiety   . Plantar fasciitis of left foot     18-May-2012   Allergies: Reviewed Allergies  Allergen Reactions  . Bupropion Hcl     REACTION: irritabillity  . Sulfa Antibiotics Itching   Current Medications: Reviewed Current Outpatient Prescriptions on File Prior to Visit  Medication Sig Dispense Refill  . albuterol (PROAIR HFA) 108 (  90 BASE) MCG/ACT inhaler Inhale 2 puffs into the lungs every 6 (six) hours as needed for wheezing or shortness of breath. Before exercise. 1 Inhaler 0  . calcium carbonate (OS-CAL) 600 MG TABS tablet Take 600 mg by mouth daily with breakfast.    . calcium-vitamin D  (OSCAL WITH D) 500-200 MG-UNIT per tablet Take 2 tablets by mouth daily.     . cholecalciferol (VITAMIN D) 1000 UNITS tablet Take 1,000 Units by mouth 2 (two) times daily.    . clonazePAM (KLONOPIN) 0.5 MG tablet Take 1 tablet (0.5 mg total) by mouth daily as needed. 90 tablet 0  . HYDROcodone-acetaminophen (NORCO) 10-325 MG per tablet Take 1 tablet by mouth 2 (two) times daily as needed. 60 tablet 0  . levothyroxine (SYNTHROID, LEVOTHROID) 88 MCG tablet TAKE 1 TABLET (88 MCG TOTAL) BY MOUTH DAILY BEFORE BREAKFAST. 90 tablet 0  . meloxicam (MOBIC) 15 MG tablet One tab PO qAM with breakfast for 2 weeks, then daily prn pain. 30 tablet 3  . Multiple Vitamin (MULTIVITAMIN) tablet Take 1 tablet by mouth daily.      Marland Kitchen oxyCODONE-acetaminophen (PERCOCET/ROXICET) 5-325 MG per tablet Take 1 tablet by mouth every 8 (eight) hours as needed. 30 tablet 0   No current facility-administered medications on file prior to visit.   Previous Psychotropic Medications: Reviewed Medication   Prozac   Clonazapam   Buspirone   Alprazolam.    Substance Abuse History in the last 12 months: Reviewed Social History  Substance Use Topics  . Smoking status: Former Smoker    Quit date: 09/20/1982  . Smokeless tobacco: Never Used  . Alcohol Use: 0.0 oz/week    0.5 drink(s) per week     Comment: Use is 1-2 a month.  Caffeine: 2 cups a day   Family History: Reviewed Family History   Problem  Relation  Age of Onset   .  COPD     .  Aneurysm     .  Anxiety disorder  Mother    .  Depression  Mother    .  Alcohol abuse  Father    .  Heart murmur  Father    .  Drug abuse  Brother    .  Anxiety disorder  Brother    .  Depression  Brother    .  Dementia  Maternal Grandfather     Psychiatric Specialty Exam:  Objective: Appearance: Casual and Well Groomed   Eye Contact:: Good   Speech: Clear and Coherent and Normal Rate   Volume: Normal   Mood: euthymic   Affect: Congruent and Full Range   Thought Process:  Coherent, Logical and Loose   Orientation: Full   Thought Content: WDL   Suicidal Thoughts: No   Homicidal Thoughts: No   Judgement: Fair   Insight: Fair   Psychomotor Activity: Normal   Akathisia: No   Handed: Left   Memory: Immediate 3/3, recent: 1/3   . Assets: Communication Skills  Desire for Improvement  Financial Resources/Insurance  Housing  Leisure Time  Physical Health  Resilience  Transportation  Vocational/Educational    Laboratory/X-Ray  Psychological Evaluation(s)   NONE  NONE    Assessment:  AXIS I  Major Depression, Recurrent severe-stable and improving. Generalized anxiety disorder. bereavement  AXIS II  No diagnosis   AXIS III  Past Medical History    Diagnosis  Date    .  Hypercholesterolemia     .  Arthritis  Right Hip    .  Hypothyroidism     .  Chronic cystitis     .  Decreased libido     .  Dyspareunia     .  Breast disorder       Rt Breast Calcifications    .  Depression     .  Anxiety     .  Plantar fasciitis of left foot       2013      AXIS IV  other psychosocial or environmental problems   AXIS V  64   Treatment Plan/Recommendations:   Plan of Care:  PLAN:  1. Affirm with the patient that the medications are taken as ordered. Patient  expressed understanding of how their medications were to be used.    Laboratory:  No labs warranted at this time.    Psychotherapy: Therapy: brief supportive therapy provided.  Discussed psychosocial stressors in detail. More than 50% of the visit was spent on individual therapy/counseling.   Medications:  Continue  the following psychiatric medications as written prior to this appointment with the following changes:: continue following for depression, anxiety.  A) take prozac  2 a day and next day 1 a day. Alternatively. Cautious about sexual side effects concern . Refill given b) continue buspirone  but cut dose down to bid. Refill not given she has plenty. c) Asked patient to limit  and try to discontinue clonazepam. Bereavement improved. Anxiety and depression baseline and not worsened Back condition effects her mood and she is on pain meds and is looking forward to get injections for her back pain.  -Risks and benefits, side effects and alternatives discussed with patient, she was given an opportunity to ask questions about her medication, illness, and treatment. All current psychiatric medications have been reviewed and discussed with the patient and adjusted as clinically appropriate. The patient has been provided an accurate and updated list of the medications being now prescribed.   Routine PRN Medications:  Negative  Consultations: The patient was encouraged to keep all PCP and specialty clinic appointments. Advised her to go to PCP for exertional dyspnea.  Safety Concerns:   Patient told to call clinic if any problems occur. Patient advised to go to  ER  if she should develop SI/HI, side effects, or if symptoms worsen. Has crisis numbers to call if needed.    Other:   8. Patient was instructed to return to clinic in 3 months.  9. The patient was advised to call and cancel their mental health appointment within 24 hours of the appointment, if they are unable to keep the appointment, as well as the three no show and termination from clinic policy. 10. The patient expressed understanding of the plan and agrees with the above.      Thresa Ross, MD  08/02/2015 9:29 AM

## 2015-08-05 ENCOUNTER — Other Ambulatory Visit: Payer: Self-pay

## 2015-08-09 ENCOUNTER — Ambulatory Visit
Admission: RE | Admit: 2015-08-09 | Discharge: 2015-08-09 | Disposition: A | Discharge status: Home / Self Care | Payer: 59 | Source: Ambulatory Visit | Attending: Sports Medicine | Admitting: Sports Medicine

## 2015-08-09 MED ORDER — METHYLPREDNISOLONE ACETATE 40 MG/ML INJ SUSP (RADIOLOG
120.0000 mg | Freq: Once | INTRAMUSCULAR | Status: AC
  Administered 2015-08-09: 120 mg via EPIDURAL

## 2015-08-09 MED ORDER — IOHEXOL 180 MG/ML  SOLN
1.0000 mL | Freq: Once | INTRAMUSCULAR | Status: DC | PRN
  Administered 2015-08-09: 1 mL via EPIDURAL

## 2015-08-09 NOTE — Discharge Instructions (Signed)

## 2015-08-13 ENCOUNTER — Other Ambulatory Visit: Payer: Self-pay | Admitting: *Deleted

## 2015-08-13 MED ORDER — HYDROCODONE-ACETAMINOPHEN 10-325 MG PO TABS: 1.0000 | ORAL_TABLET | Freq: Two times a day (BID) | ORAL | Status: DC | PRN

## 2015-08-19 ENCOUNTER — Ambulatory Visit: Payer: 59 | Admitting: Sports Medicine

## 2015-08-22 ENCOUNTER — Encounter: Payer: Self-pay | Admitting: Family Medicine

## 2015-08-22 ENCOUNTER — Encounter: Payer: Self-pay | Admitting: Sports Medicine

## 2015-08-22 ENCOUNTER — Ambulatory Visit (INDEPENDENT_AMBULATORY_CARE_PROVIDER_SITE_OTHER): Payer: 59 | Admitting: Sports Medicine

## 2015-08-22 ENCOUNTER — Ambulatory Visit (INDEPENDENT_AMBULATORY_CARE_PROVIDER_SITE_OTHER): Payer: 59 | Admitting: Family Medicine

## 2015-08-22 VITALS — BP 129/75 | HR 54 | Temp 98.0°F | Ht 68.0 in | Wt 161.0 lb

## 2015-08-22 DIAGNOSIS — M4726 Other spondylosis with radiculopathy, lumbar region: Secondary | ICD-10-CM | POA: Diagnosis not present

## 2015-08-22 DIAGNOSIS — R197 Diarrhea, unspecified: Secondary | ICD-10-CM

## 2015-08-22 DIAGNOSIS — R159 Full incontinence of feces: Secondary | ICD-10-CM | POA: Diagnosis not present

## 2015-08-22 LAB — CBC WITH DIFFERENTIAL/PLATELET
Basophils Absolute: 0.1 10*3/uL (ref 0.0–0.1)
Basophils Relative: 1 % (ref 0–1)
EOS PCT: 1 % (ref 0–5)
Eosinophils Absolute: 0.1 10*3/uL (ref 0.0–0.7)
HCT: 41.5 % (ref 36.0–46.0)
HEMOGLOBIN: 13.8 g/dL (ref 12.0–15.0)
LYMPHS ABS: 1.9 10*3/uL (ref 0.7–4.0)
LYMPHS PCT: 35 % (ref 12–46)
MCH: 29.6 pg (ref 26.0–34.0)
MCHC: 33.3 g/dL (ref 30.0–36.0)
MCV: 88.9 fL (ref 78.0–100.0)
MONO ABS: 0.6 10*3/uL (ref 0.1–1.0)
MONOS PCT: 10 % (ref 3–12)
MPV: 9.1 fL (ref 8.6–12.4)
NEUTROS ABS: 2.9 10*3/uL (ref 1.7–7.7)
Neutrophils Relative %: 53 % (ref 43–77)
Platelets: 290 10*3/uL (ref 150–400)
RBC: 4.67 MIL/uL (ref 3.87–5.11)
RDW: 14.3 % (ref 11.5–15.5)
WBC: 5.5 10*3/uL (ref 4.0–10.5)

## 2015-08-22 NOTE — Assessment & Plan Note (Signed)
Good but temporary pain relief after left selective S1 epidural. Pain is now predominantly on the right side, I would like to again try a right-sided L5-S1 interlaminar epidural, if the L5-S1 interspace is inaccessible, then I would like to try at the right L4-L5 level rather than another selective S1 nerve block

## 2015-08-22 NOTE — Progress Notes (Signed)
Subjective:    Patient ID: [name], female    DOB: 08-24-1956, 59 y.o.   MRN: [id number]  HPI patient had originally called in July and he complained about some loose stools and bowel leakage for about 3 months. Check she came in and saw Dr. Denyse Amass about a month ago. He did do some blood work to evaluate for celiac disease and had recommended a low gluten diet.  Says she has really modified her diet but hasn't noticed a significant change.  Say she passes gas the BM comes with it.  Describes it as a pudding consistanlty.  No mucous.  Has had to wear a pad daily.  No blood in the stool. No recent travel outside the country. No camping or hiking. Switched to lactaid and has cut out dairy.  No fever, chills or sweats. She denies any excess bloating and gas. And no upper GI symptoms.   Review of Systems   BP 129/75 mmHg  Pulse 54  Temp(Src) 98 F (36.7 C)  Ht  (1.727 m)  Wt 161 lb (73.029 kg)  BMI 24.49 kg/m2    Allergies  Allergen Reactions  . Bupropion Hcl     REACTION: irritabillity  . Sulfa Antibiotics Itching    Past Medical History  Diagnosis Date  . Hypercholesterolemia   . Arthritis     Right Hip  . Hypothyroidism   . Chronic cystitis   . Dyspareunia   . Breast disorder      Rt Breast Calcifications  . Depression   . Anxiety   . Plantar fasciitis of left foot     2013    Past Surgical History  Procedure Laterality Date  . Scoliosis repair with harrington rods    . Breast enhancement surgery    . Plastic surgry to eyes      Social History   Social History  . Marital Status: Married    Spouse Name: N/A  . Number of Children: N/A  . Years of Education: N/A   Occupational History  . Not on file.   Social History Main Topics  . Smoking status: Former Smoker    Quit date: 09/20/1982  . Smokeless tobacco: Never Used  . Alcohol Use: 0.0 oz/week    0.5 drink(s) per week     Comment: Use is 1-2 a month.  . Drug Use: No  . Sexual Activity:     Partners: Male   Other Topics Concern  . 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      Family History  Problem Relation Age of Onset  . COPD Father     smoker  . Aneurysm    . Anxiety disorder Mother   . Depression Mother   . Alcohol abuse Father   . Heart murmur Father   . Drug abuse Brother   . Anxiety disorder Brother   . Depression Brother   . Dementia Maternal Grandfather     Outpatient Encounter Prescriptions as of 08/22/2015  Medication Sig  . albuterol (PROAIR HFA) 108 (90 BASE) MCG/ACT inhaler Inhale 2 puffs into the lungs every 6 (six) hours as needed for wheezing or shortness of breath. Before exercise.  . busPIRone (BUSPAR) 15 MG tablet Take 1 tablet (15 mg total) by mouth 2 (two) times daily. Three tablets a day  . calcium carbonate (OS-CAL) 600 MG TABS tablet Take 600 mg by mouth 2 (two) times daily with a  meal.   . calcium-vitamin D (OSCAL WITH D) 500-200 MG-UNIT per tablet Take 2 tablets by mouth daily.   . cholecalciferol (VITAMIN D) 1000 UNITS tablet Take 1,000 Units by mouth 2 (two) times daily.  . clonazePAM (KLONOPIN) 0.5 MG tablet Take 1 tablet (0.5 mg total) by mouth daily as needed.  Marland Kitchen FLUoxetine (PROZAC) 40 MG capsule Take 1 capsule (40 mg total) by mouth daily. Take 40mg  one day and next day taking 2 capsules. alternating  . HYDROcodone-acetaminophen (NORCO) 10-325 MG per tablet Take 1 tablet by mouth 2 (two) times daily as needed.  Marland Kitchen levothyroxine (SYNTHROID, LEVOTHROID) 88 MCG tablet TAKE 1 TABLET (88 MCG TOTAL) BY MOUTH DAILY BEFORE BREAKFAST.  . meloxicam (MOBIC) 15 MG tablet One tab PO qAM with breakfast for 2 weeks, then daily prn pain.  . Multiple Vitamin (MULTIVITAMIN) tablet Take 1 tablet by mouth daily.    Marland Kitchen oxyCODONE-acetaminophen (PERCOCET/ROXICET) 5-325 MG per tablet Take 1 tablet by mouth every 8 (eight) hours as needed.   No facility-administered encounter medications on file as of 08/22/2015.           Objective:   Physical Exam  Constitutional: She is oriented to person, place, and time. She appears well-developed and well-nourished.  HENT:  Head: Normocephalic and atraumatic.  Cardiovascular: Normal rate, regular rhythm and normal heart sounds.   Pulmonary/Chest: Effort normal and breath sounds normal.  Abdominal: Soft. Bowel sounds are normal. She exhibits no distension and no mass. There is no tenderness. There is no rebound and no guarding.  Musculoskeletal: She exhibits no edema.  Neurological: She is alert and oriented to person, place, and time.  Skin: Skin is warm and dry.  Psychiatric: She has a normal mood and affect. Her behavior is normal.          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 We'll also check a CBC with differential that she has been afebrile. If lab work is completely normal then I recommend that we get her in with GI for further evaluation. She agrees to this.

## 2015-08-22 NOTE — Progress Notes (Signed)
  Subjective:    CC: Follow-up after her epidural  HPI: This is exquisitely pleasant 59 year old female, she has lumbar spondylosis with degenerative scoliosis, she is post surgical intervention, she has had low back pain, with spondylosis and failed conservative measures, eventually we had ordered a left-sided L5-S1 interlaminar epidural, it seems as though there was no available interspace at the L5-S1 level due to spondylosis, subsequent the she was given a selective S1 nerve root block that provided 3 days of good pain relief. Pain has returned, this time predominantly on the right side, worse with sitting, and radiating down into the right buttock. She is amenable to try epidural #2 in the series.  Past medical history, Surgical history, Family history not pertinant except as noted below, Social history, Allergies, and medications have been entered into the medical record, reviewed, and no changes needed.   Review of Systems: No fevers, chills, night sweats, weight loss, chest pain, or shortness of breath.   Objective:    General: Well Developed, well nourished, and in no acute distress.  Neuro: Alert and oriented x3, extra-ocular muscles intact, sensation grossly intact.  HEENT: Normocephalic, atraumatic, pupils equal round reactive to light, neck supple, no masses, no lymphadenopathy, thyroid nonpalpable.  Skin: Warm and dry, no rashes. Cardiac: Regular rate and rhythm, no murmurs rubs or gallops, no lower extremity edema.  Respiratory: Clear to auscultation bilaterally. Not using accessory muscles, speaking in full sentences.  Back Exam:  Inspection: Unremarkable  Motion: Flexion 45 deg, Extension 45 deg, Side Bending to 45 deg bilaterally,  Rotation to 45 deg bilaterally  SLR laying: Negative  XSLR laying: Negative  Palpable tenderness: No tenderness over the sacroiliac joints, she does have some pain in the paralumbar musculature. FABER: negative. Sensory change: Gross sensation  intact to all lumbar and sacral dermatomes.  Reflexes: 2+ at both patellar tendons, 2+ at achilles tendons, Babinski's downgoing.  Strength at foot  Plantar-flexion: 5/5 Dorsi-flexion: 5/5 Eversion: 5/5 Inversion: 5/5  Leg strength  Quad: 5/5 Hamstring: 5/5 Hip flexor: 5/5 Hip abductors: 5/5  Gait unremarkable.  I again reviewed her lumbar spine MRI that shows multilevel spondylosis of the form of minimal degenerative disc disease and moderate multilevel facet arthritis.  Impression and Recommendations:

## 2015-08-23 ENCOUNTER — Ambulatory Visit: Payer: Self-pay | Admitting: Family Medicine

## 2015-08-23 ENCOUNTER — Ambulatory Visit: Payer: 59 | Admitting: Sports Medicine

## 2015-08-27 ENCOUNTER — Other Ambulatory Visit: Payer: Self-pay | Admitting: Family Medicine

## 2015-08-29 LAB — C. DIFFICILE GDH AND TOXIN A/B

## 2015-08-31 LAB — STOOL CULTURE

## 2015-09-20 ENCOUNTER — Telehealth: Payer: Self-pay | Admitting: Sports Medicine

## 2015-09-20 NOTE — Telephone Encounter (Signed)
Patient dropped off papers Wednesday 09/18/15 for Dr. Karie Schwalbe to see that she needs a letter stating her procedure or surgery she had was medically necessity in order for them to pay. Her husband walked-in and request to pick up letter but [name] the letter has not been completed and will have to call when ready for pick up. Please call [phone] if any questions. Thanks

## 2015-09-23 ENCOUNTER — Encounter: Payer: Self-pay | Admitting: Sports Medicine

## 2015-09-23 NOTE — Telephone Encounter (Signed)
Letter completed and ready to pick up.

## 2015-09-24 ENCOUNTER — Encounter: Payer: Self-pay | Admitting: Sports Medicine

## 2015-09-25 ENCOUNTER — Other Ambulatory Visit (HOSPITAL_COMMUNITY): Payer: Self-pay | Admitting: Psychiatry

## 2015-09-25 ENCOUNTER — Other Ambulatory Visit: Payer: Self-pay | Admitting: Family Medicine

## 2015-09-27 NOTE — Telephone Encounter (Signed)
Received medication request for Prozac  from [facility]. Per Dr. Gilmore Laroche, prescription for Prozac was filled on 08/02/15 for a 90 day supply. Pt is schedule for a follow up appt on 10/07/15.

## 2015-10-07 ENCOUNTER — Ambulatory Visit (INDEPENDENT_AMBULATORY_CARE_PROVIDER_SITE_OTHER): Payer: 59 | Admitting: Psychiatry

## 2015-10-07 ENCOUNTER — Encounter: Payer: Self-pay | Admitting: Sports Medicine

## 2015-10-07 ENCOUNTER — Other Ambulatory Visit: Payer: Self-pay | Admitting: *Deleted

## 2015-10-07 ENCOUNTER — Ambulatory Visit (INDEPENDENT_AMBULATORY_CARE_PROVIDER_SITE_OTHER): Payer: 59 | Admitting: Sports Medicine

## 2015-10-07 ENCOUNTER — Encounter (HOSPITAL_COMMUNITY): Payer: Self-pay | Admitting: Psychiatry

## 2015-10-07 VITALS — BP 128/77 | HR 69 | Wt 163.0 lb

## 2015-10-07 DIAGNOSIS — F4321 Adjustment disorder with depressed mood: Secondary | ICD-10-CM

## 2015-10-07 DIAGNOSIS — Z634 Disappearance and death of family member: Secondary | ICD-10-CM | POA: Diagnosis not present

## 2015-10-07 DIAGNOSIS — F331 Major depressive disorder, recurrent, moderate: Secondary | ICD-10-CM

## 2015-10-07 DIAGNOSIS — M4726 Other spondylosis with radiculopathy, lumbar region: Secondary | ICD-10-CM | POA: Diagnosis not present

## 2015-10-07 DIAGNOSIS — F0630 Mood disorder due to known physiological condition, unspecified: Secondary | ICD-10-CM

## 2015-10-07 DIAGNOSIS — F411 Generalized anxiety disorder: Secondary | ICD-10-CM

## 2015-10-07 MED ORDER — HYDROCODONE-ACETAMINOPHEN 10-325 MG PO TABS: 1.0000 | ORAL_TABLET | Freq: Two times a day (BID) | ORAL | Status: DC | PRN

## 2015-10-07 NOTE — Progress Notes (Signed)
  Subjective:    CC: Follow-up back pain  HPI: This is a pleasant 59 year old female, she has both axial and radicular pain in her back, she initially had left-sided S1 type radiculopathy, she responded temporarily to an S1 selective nerve block, she subsequent started having right-sided axial, discogenic, with right-sided radicular pain, she has not yet obtained her right-sided L5-S1 interlaminar epidural, she states that she does not take it will work, she wonders if multilevel facet injections will be more effective, since her husband did proceed down the pathway of facet injections and radio frequency ablation and is doing well.   Past medical history, Surgical history, Family history not pertinant except as noted below, Social history, Allergies, and medications have been entered into the medical record, reviewed, and no changes needed.   Review of Systems: No fevers, chills, night sweats, weight loss, chest pain, or shortness of breath.   Objective:    General: Well Developed, well nourished, and in no acute distress.  Neuro: Alert and oriented x3, extra-ocular muscles intact, sensation grossly intact.  HEENT: Normocephalic, atraumatic, pupils equal round reactive to light, neck supple, no masses, no lymphadenopathy, thyroid nonpalpable.  Skin: Warm and dry, no rashes. Cardiac: Regular rate and rhythm, no murmurs rubs or gallops, no lower extremity edema.  Respiratory: Clear to auscultation bilaterally. Not using accessory muscles, speaking in full sentences.  I reviewed her MRI again, the prior fusion is visible, she has protrusions at the lower levels, as well as multilevel lower lumbar facet arthritis bilaterally.  Impression and Recommendations:

## 2015-10-07 NOTE — Assessment & Plan Note (Signed)
Good with only temporary 3 day relief after left selective S1 epidural, has not yet gotten the right L5-S1 interlaminar epidural, if the L5-S1 interspace is accessible then certainly they can try the L4-L5 level rather than another selective S1 nerve block. She does have some facet arthritis, so these will serve as secondary interventional targets should her epidural be ineffective. She will go ahead and schedule her own epidural. Return to see me 3 weeks after injection.

## 2015-10-07 NOTE — Progress Notes (Signed)
Patient ID: Sharon AmbleNancy Greene, female   DOB: 1956/03/24, 59 y.o.   MRN: [id number]   [facility]  [name] 1956/03/24  Date:10/06/2015  Chief Complaint:  Follow Up.  History of Chief Complaint:   HPI Comments: Ms. [name] is a 59 y/o female with a past psychiatric history significant for Major Depression, Recurrent severe, generalized anxiety and bereavement. The patient is referred for psychiatric services for medication management.    . Location:  Her depression dates back to 5 years ago when her brother was involved in drugs and she lost her mom. Those stressors are better now. Brother not using. She has supportive husband. Tolerating meds and she wanted to gradually get off some meds. NO panic attacks and worries are less bothersome. Decrease libido but otherwise no side effects.   Last visit we cut down the Prozac to 80 mg alternating with 40 mg which she is feeling depressed or depression is worsened. She is thinking about  her mom states that October is a difficult month  Depression; worsened Work keeps her busy. Bereavement: anniversary of her mom and dad is around mothers and fathers day month. That keeps her down during spring month. ongoing  She has cut down her buspirone to bid and have plenty left over.  Anxiety: related to stress and anniversaries. meds help including buspirone.  . Severity: Depression:4/10 (0=Very depressed; 5=Neutral; 10=Very Happy)  Anxiety- 0-1/10 (0=no anxiety; 5= moderate/tolerable anxiety; 10= panic attacks)  . Duration: The patient notes depression since her early 6330's; and worsened since 2008 with the death of her parents.   . Timing: Mood is worse n the afternoon at 5 PM.  . Context: Being late. Being afraid she will "screw up at work."  . Modifying factors: Mood improves with exercise.  No psychotic symptoms     Review of Systems  Constitutional: Negative for fever.  Cardiovascular: Negative  for chest pain.  Skin: Negative for rash.  Psychiatric/Behavioral: Positive for depression. Negative for suicidal ideas and substance abuse. The patient is not nervous/anxious.      There were no vitals filed for this visit. Physical Exam  Vitals reviewed.  Constitutional: She appears well-developed and well-nourished. No distress.  Skin: She is not diaphoretic.   Musculoskeletal: Strength & Muscle Tone: within normal limits Gait & Station: normal Patient leans: N/A   Past Medical History: Reviewed Past Medical History  Diagnosis Date  . Hypercholesterolemia   . Arthritis     Right Hip  . Hypothyroidism   . Chronic cystitis   . Dyspareunia   . Breast disorder      Rt Breast Calcifications  . Depression   . Anxiety   . Plantar fasciitis of left foot     2013   Allergies: Reviewed Allergies  Allergen Reactions  . Bupropion Hcl     REACTION: irritabillity  . Sulfa Antibiotics Itching   Current Medications: Reviewed Current Outpatient Prescriptions on File Prior to Visit  Medication Sig Dispense Refill  . albuterol (PROAIR HFA) 108 (90 BASE) MCG/ACT inhaler Inhale 2 puffs into the lungs every 6 (six) hours as needed for wheezing or shortness of breath. Before exercise. 1 Inhaler 0  . busPIRone (BUSPAR) 15 MG tablet Take 1 tablet (15 mg total) by mouth 2 (two) times daily. Three tablets a day 10 tablet 0  . calcium carbonate (OS-CAL) 600 MG TABS tablet Take 600 mg by mouth 2 (two) times daily with a meal.     . calcium-vitamin  D (OSCAL WITH D) 500-200 MG-UNIT per tablet Take 2 tablets by mouth daily.     . cholecalciferol (VITAMIN D) 1000 UNITS tablet Take 1,000 Units by mouth 2 (two) times daily.    . clonazePAM (KLONOPIN) 0.5 MG tablet TAKE 1 TABLET BY MOUTH DAILY AS NEEDED 90 tablet 0  . FLUoxetine (PROZAC) 40 MG capsule Take 1 capsule (40 mg total) by mouth daily. Take  one day and next day taking 2 capsules. alternating 135 capsule 0  . HYDROcodone-acetaminophen  (NORCO) 10-325 MG per tablet Take 1 tablet by mouth 2 (two) times daily as needed. 60 tablet 0  . levothyroxine (SYNTHROID, LEVOTHROID) 88 MCG tablet TAKE 1 TABLET (88 MCG TOTAL) BY MOUTH DAILY BEFORE BREAKFAST. 90 tablet 0  . levothyroxine (SYNTHROID, LEVOTHROID) 88 MCG tablet TAKE 1 TABLET (88 MCG TOTAL) BY MOUTH DAILY BEFORE BREAKFAST. Due for lab work. 90 tablet 0  . Multiple Vitamin (MULTIVITAMIN) tablet Take 1 tablet by mouth daily.      Marland Kitchen oxyCODONE-acetaminophen (PERCOCET/ROXICET) 5-325 MG per tablet Take 1 tablet by mouth every 8 (eight) hours as needed. 30 tablet 0   No current facility-administered medications on file prior to visit.   Previous Psychotropic Medications: Reviewed Medication   Prozac   Clonazapam   Buspirone   Alprazolam.    Substance Abuse History in the last 12 months: Reviewed Social History  Substance Use Topics  . Smoking status: Former Smoker    Quit date: 09/20/1982  . Smokeless tobacco: Never Used  . Alcohol Use: 0.0 oz/week    0.5 drink(s) per week     Comment: Use is 1-2 a month.  Caffeine: 2 cups a day   Family History: Reviewed Family History   Problem  Relation  Age of Onset   .  COPD     .  Aneurysm     .  Anxiety disorder  Mother    .  Depression  Mother    .  Alcohol abuse  Father    .  Heart murmur  Father    .  Drug abuse  Brother    .  Anxiety disorder  Brother    .  Depression  Brother    .  Dementia  Maternal Grandfather     Psychiatric Specialty Exam:  Objective: Appearance: Casual and Well Groomed   Eye Contact:: Good   Speech: Clear and Coherent and Normal Rate   Volume: Normal   Mood: dysphoric   Affect: Congruent and depressed  Thought Process: Coherent, Logical and Loose   Orientation: Full   Thought Content: WDL   Suicidal Thoughts: No   Homicidal Thoughts: No   Judgement: Fair   Insight: Fair   Psychomotor Activity: Normal   Akathisia: No   Handed: Left   Memory: Immediate 3/3, recent: 1/3   . Assets:  Communication Skills  Desire for Improvement  Financial Resources/Insurance  Housing  Leisure Time  Physical Health  Resilience  Transportation  Vocational/Educational    Laboratory/X-Ray  Psychological Evaluation(s)   NONE  NONE    Assessment:  AXIS I  Major Depression, Recurrent severe-stable and improving. Generalized anxiety disorder. bereavement  AXIS II  No diagnosis   AXIS III  Past Medical History    Diagnosis  Date    .  Hypercholesterolemia     .  Arthritis       Right Hip    .  Hypothyroidism     .  Chronic cystitis     .  Decreased libido     .  Dyspareunia     .  Breast disorder       Rt Breast Calcifications    .  Depression     .  Anxiety     .  Plantar fasciitis of left foot       2013      AXIS IV  other psychosocial or environmental problems   AXIS V  65   Treatment Plan/Recommendations:   Plan of Care:  PLAN:  1. Affirm with the patient that the medications are taken as ordered. Patient  expressed understanding of how their medications were to be used.    Laboratory:  No labs warranted at this time.    Psychotherapy: Therapy: brief supportive therapy provided.  Discussed psychosocial stressors in detail. More than 50% of the visit was spent on individual therapy/counseling.   Medications:  Continue  the following psychiatric medications as written prior to this appointment with the following changes:: continue following for depression, anxiety.  A)increase prozac back to 80 mg qd. She has refill b) continue buspirone bid  Refill not given, says she has one.  c) Asked patient to limit and try to discontinue clonazepam. Bereavement ; recurrent Back condition effects her mood and she is on pain meds and is looking forward to get injections for her back pain.  -Risks and benefits, side effects and alternatives discussed with patient, she was given an opportunity to ask questions about her medication, illness, and treatment. All current  psychiatric medications have been reviewed and discussed with the patient and adjusted as clinically appropriate. The patient has been provided an accurate and updated list of the medications being now prescribed.   Routine PRN Medications:  Negative  Consultations: The patient was encouraged to keep all PCP and specialty clinic appointments. Advised her to go to PCP for exertional dyspnea.  Safety Concerns:   Patient told to call clinic if any problems occur. Patient advised to go to  ER  if she should develop SI/HI, side effects, or if symptoms worsen. Has crisis numbers to call if needed.    Other:   8. Patient was instructed to return to clinic in 1 month. 9. The patient was advised to call and cancel their mental health appointment within 24 hours of the appointment, if they are unable to keep the appointment, as well as the three no show and termination from clinic policy. 10. The patient expressed understanding of the plan and agrees with the above.      Thresa Ross, MD  10/07/2015 1:35 PM

## 2015-10-22 HISTORY — PX: KNEE SURGERY: SHX244

## 2015-11-04 ENCOUNTER — Ambulatory Visit (INDEPENDENT_AMBULATORY_CARE_PROVIDER_SITE_OTHER): Payer: 59 | Admitting: Psychiatry

## 2015-11-04 ENCOUNTER — Encounter (HOSPITAL_COMMUNITY): Payer: Self-pay | Admitting: Psychiatry

## 2015-11-04 VITALS — BP 112/64 | HR 73 | Ht 68.0 in | Wt 163.0 lb

## 2015-11-04 DIAGNOSIS — F411 Generalized anxiety disorder: Secondary | ICD-10-CM

## 2015-11-04 DIAGNOSIS — F331 Major depressive disorder, recurrent, moderate: Secondary | ICD-10-CM

## 2015-11-04 DIAGNOSIS — F4321 Adjustment disorder with depressed mood: Secondary | ICD-10-CM | POA: Diagnosis not present

## 2015-11-04 MED ORDER — BUSPIRONE HCL 15 MG PO TABS: 15.0000 mg | ORAL_TABLET | Freq: Two times a day (BID) | ORAL | Status: DC

## 2015-11-04 MED ORDER — FLUOXETINE HCL 40 MG PO CAPS: ORAL_CAPSULE | ORAL | Status: DC

## 2015-11-04 NOTE — Progress Notes (Signed)
Patient ID: [name], female   DOB: [birth year], 59 y.o.   MRN: [id number]   [facility] Follow-up Outpatient Visit  [name] [birth year]  Date:11/04/2015  Chief Complaint:  Follow Up. Depression and anxiety  History of Chief Complaint:   HPI Comments: [name] is a 59 y/o female with a past psychiatric history significant for Major Depression, Recurrent severe, generalized anxiety and bereavement. The patient is referred for psychiatric services for medication management.    . Location:  Her depression dates back to 6 years ago when her brother was involved in drugs and she lost her mom. Those stressors are better now. Brother not using. She has supportive husband. Tolerating meds and she wanted to gradually get off some meds. NO panic attacks and worries are less bothersome. Decrease libido but otherwise no side effects.   She did not do good with lower dose of prozac of . So we increased it back to  . That has helped but still feel teary.  She gets lonely and that upsets her.   Work keeps her busy. Bereavement: anniversary of her mom and dad is around mothers and fathers day month. She has cut down her buspirone to bid .   Anxiety: related to stress and anniversaries. meds help including buspirone.  . Severity: Depression:4/10 (0=Very depressed; 5=Neutral; 10=Very Happy)  Anxiety- 0-1/10 (0=no anxiety; 5= moderate/tolerable anxiety; 10= panic attacks)  . Duration: The patient notes depression since her early 72's; and worsened since 01-May-2007 with the death of her parents.   . Timing: Mood is worse n the afternoon at 5 PM.  . Context: Being late. Being afraid she will "screw up at work."  . Modifying factors: Mood improves with exercise.  No psychotic symptoms     Review of Systems  Constitutional: Negative for fever.  Cardiovascular: Negative for chest pain.  Skin: Negative for rash.  Neurological: Negative for tremors.   Psychiatric/Behavioral: Negative for suicidal ideas and substance abuse. The patient is not nervous/anxious.      Filed Vitals:   11/04/15 1341  BP: 112/64  Pulse: 73  Height:  (1.727 m)  Weight: 163 lb (73.936 kg)  SpO2: 94%   Physical Exam  Vitals reviewed.  Constitutional: She appears well-developed and well-nourished. No distress.  Skin: She is not diaphoretic.   Musculoskeletal: Strength & Muscle Tone: within normal limits Gait & Station: normal Patient leans: N/A   Past Medical History: Reviewed Past Medical History  Diagnosis Date  . Hypercholesterolemia   . Arthritis     Right Hip  . Hypothyroidism   . Chronic cystitis   . Dyspareunia   . Breast disorder      Rt Breast Calcifications  . Depression   . Anxiety   . Plantar fasciitis of left foot     04-30-2012   Allergies: Reviewed Allergies  Allergen Reactions  . Bupropion Hcl     REACTION: irritabillity  . Sulfa Antibiotics Itching   Current Medications: Reviewed Current Outpatient Prescriptions on File Prior to Visit  Medication Sig Dispense Refill  . albuterol (PROAIR HFA) 108 (90 BASE) MCG/ACT inhaler Inhale 2 puffs into the lungs every 6 (six) hours as needed for wheezing or shortness of breath. Before exercise. 1 Inhaler 0  . calcium carbonate (OS-CAL) 600 MG TABS tablet Take 600 mg by mouth 2 (two) times daily with a meal.     . calcium-vitamin D (OSCAL WITH D) 500-200 MG-UNIT per tablet Take 2 tablets by mouth daily.     Marland Kitchen  cholecalciferol (VITAMIN D) 1000 UNITS tablet Take 1,000 Units by mouth 2 (two) times daily.    . clonazePAM (KLONOPIN) 0.5 MG tablet TAKE 1 TABLET BY MOUTH DAILY AS NEEDED 90 tablet 0  . HYDROcodone-acetaminophen (NORCO) 10-325 MG tablet Take 1 tablet by mouth 2 (two) times daily as needed. 60 tablet 0  . levothyroxine (SYNTHROID, LEVOTHROID) 88 MCG tablet TAKE 1 TABLET (88 MCG TOTAL) BY MOUTH DAILY BEFORE BREAKFAST. 90 tablet 0  . Multiple Vitamin (MULTIVITAMIN) tablet Take  1 tablet by mouth daily.      Marland Kitchen oxyCODONE-acetaminophen (PERCOCET/ROXICET) 5-325 MG per tablet Take 1 tablet by mouth every 8 (eight) hours as needed. 30 tablet 0   No current facility-administered medications on file prior to visit.   Previous Psychotropic Medications: Reviewed Medication   Prozac   Clonazapam   Buspirone   Alprazolam.    Substance Abuse History in the last 12 months: Reviewed Social History  Substance Use Topics  . Smoking status: Former Smoker    Quit date: 09/20/1982  . Smokeless tobacco: Never Used  . Alcohol Use: 0.0 oz/week    0.5 drink(s) per week     Comment: Use is 1-2 a month.  Caffeine: 2 cups a day   Family History: Reviewed Family History   Problem  Relation  Age of Onset   .  COPD     .  Aneurysm     .  Anxiety disorder  Mother    .  Depression  Mother    .  Alcohol abuse  Father    .  Heart murmur  Father    .  Drug abuse  Brother    .  Anxiety disorder  Brother    .  Depression  Brother    .  Dementia  Maternal Grandfather     Psychiatric Specialty Exam:  Objective: Appearance: Casual and Well Groomed   Eye Contact:: Good   Speech: Clear and Coherent and Normal Rate   Volume: Normal   Mood:  Somewhat dysphoric   Affect: Congruent and depressed  Thought Process: Coherent, Logical and Loose   Orientation: Full   Thought Content: WDL   Suicidal Thoughts: No   Homicidal Thoughts: No   Judgement: Fair   Insight: Fair   Psychomotor Activity: Normal   Akathisia: No   Handed: Left   Memory: Immediate 3/3, recent: 1/3   . 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 Generalized anxiety disorder. bereavement  AXIS II  No diagnosis   AXIS III  Past  Medical History    Diagnosis  Date    .  Hypercholesterolemia     .  Arthritis       Right Hip    .  Hypothyroidism     .  Chronic cystitis     .  Decreased libido     .  Dyspareunia     .  Breast disorder       Rt Breast Calcifications    .  Depression     .  Anxiety     .  Plantar fasciitis of left foot       2013      AXIS IV  other psychosocial or environmental problems  AXIS V  65   Treatment Plan/Recommendations:   Plan of Care:  PLAN:  1. Affirm with the patient that the medications are taken as ordered. Patient  expressed understanding of how their medications were to be used.    Laboratory:  No labs warranted at this time.    Psychotherapy: Therapy: brief supportive therapy provided.  Discussed psychosocial stressors in detail. More than 50% of the visit was spent on individual therapy/counseling.   Medications:  Continue  the following psychiatric medications as written prior to this appointment with the following changes:: continue following for depression, anxiety.  A)prozac back to 80 mg qd. Refill given b) continue buspirone 15mg bid  Refill given  c) Asked patient to limit and try to discontinue clonazepam. Bereavement ; recurrent Back condition effects her mood and she is on pain meds and is looking forward to get injections for her back pain.  -Risks and benefits, side effects and alternatives discussed with patient, she was given an opportunity to ask questions about her medication, illness, and treatment. All current psychiatric medications have been reviewed and discussed with the patient and adjusted as clinically appropriate. The patient has been provided an accurate and updated list of the medications being now prescribed.   Routine PRN Medications:  Negative  Consultations: The patient was encouraged to keep all PCP and specialty clinic appointments. Advised her to go to PCP for exertional dyspnea.  Safety Concerns:   Patient told to call clinic if any  problems occur. Patient advised to go to  ER  if she should develop SI/HI, side effects, or if symptoms worsen. Has crisis numbers to call if needed.    Other:   8. Patient was instructed to return to clinic in 2 month. 9. The patient was advised to call and cancel their mental health appointment within 24 hours of the appointment, if they are unable to keep the appointment, as well as the three no show and termination from clinic policy. 10. The patient expressed understanding of the plan and agrees with the above.      Thresa RossNadeem Summers Buendia, MD  11/04/2015 1:56 PM

## 2015-11-08 ENCOUNTER — Ambulatory Visit
Admission: RE | Admit: 2015-11-08 | Discharge: 2015-11-08 | Disposition: A | Discharge status: Home / Self Care | Payer: 59 | Source: Ambulatory Visit | Attending: Sports Medicine | Admitting: Sports Medicine

## 2015-11-08 MED ORDER — IOHEXOL 180 MG/ML  SOLN
1.0000 mL | Freq: Once | INTRAMUSCULAR | Status: DC | PRN
  Administered 2015-11-08: 1 mL via EPIDURAL

## 2015-11-08 MED ORDER — TRIAMCINOLONE ACETONIDE 40 MG/ML IJ SUSP (RADIOLOGY): 60.0000 mg | Freq: Once | INTRAMUSCULAR | Status: DC

## 2015-11-08 MED ORDER — IOHEXOL 300 MG/ML  SOLN: 1.0000 mL | Freq: Once | INTRAMUSCULAR | Status: DC | PRN

## 2015-11-08 MED ORDER — METHYLPREDNISOLONE ACETATE 40 MG/ML INJ SUSP (RADIOLOG
120.0000 mg | Freq: Once | INTRAMUSCULAR | Status: AC
  Administered 2015-11-08: 120 mg via EPIDURAL

## 2015-11-08 NOTE — Discharge Instructions (Signed)

## 2015-11-13 DIAGNOSIS — S83209A Unspecified tear of unspecified meniscus, current injury, unspecified knee, initial encounter: Secondary | ICD-10-CM

## 2015-11-13 HISTORY — DX: Unspecified tear of unspecified meniscus, current injury, unspecified knee, initial encounter: S83.209A

## 2015-11-18 ENCOUNTER — Ambulatory Visit: Payer: Self-pay | Admitting: Family Medicine

## 2015-11-22 ENCOUNTER — Ambulatory Visit (INDEPENDENT_AMBULATORY_CARE_PROVIDER_SITE_OTHER): Payer: 59 | Admitting: Sports Medicine

## 2015-11-22 ENCOUNTER — Ambulatory Visit (INDEPENDENT_AMBULATORY_CARE_PROVIDER_SITE_OTHER): Payer: 59 | Admitting: Rehabilitative and Restorative Service Providers"

## 2015-11-22 ENCOUNTER — Encounter: Payer: Self-pay | Admitting: Sports Medicine

## 2015-11-22 ENCOUNTER — Encounter: Payer: Self-pay | Admitting: Rehabilitative and Restorative Service Providers"

## 2015-11-22 VITALS — BP 117/81 | HR 101 | Wt 160.0 lb

## 2015-11-22 DIAGNOSIS — M2560 Stiffness of unspecified joint, not elsewhere classified: Secondary | ICD-10-CM

## 2015-11-22 DIAGNOSIS — Z7409 Other reduced mobility: Secondary | ICD-10-CM

## 2015-11-22 DIAGNOSIS — R531 Weakness: Secondary | ICD-10-CM

## 2015-11-22 DIAGNOSIS — M25561 Pain in right knee: Secondary | ICD-10-CM | POA: Diagnosis not present

## 2015-11-22 DIAGNOSIS — M4726 Other spondylosis with radiculopathy, lumbar region: Secondary | ICD-10-CM

## 2015-11-22 NOTE — Assessment & Plan Note (Signed)
Excellent response to the most recent L5-S1 interlaminar epidural, the first epidural was done at the selective S1 epidural and did not work. Patient is very happy with results and can follow-up as needed.

## 2015-11-22 NOTE — Therapy (Signed)
[facility] [place] [address] [place], [place], [address] Phone: [phone]   Fax:  [phone]  Physical Therapy Evaluation  Patient Details  Name: Sharon Ambleancy Vokes MRN: [id number] Date of Birth: 1956-11-27 Referring Provider: Dr. Milly JakobJohn Lee Graves  Encounter Date: 11/22/2015      PT End of Session - 11/22/15 1448    Visit Number 1   Number of Visits 12   Date for PT Re-Evaluation 01/03/16   PT Start Time 1448   PT Stop Time 1540   PT Time Calculation (min) 52 min   Activity Tolerance Patient tolerated treatment well      Past Medical History  Diagnosis Date  . Hypercholesterolemia   . Arthritis     Right Hip  . Hypothyroidism   . Chronic cystitis   . Dyspareunia   . Breast disorder      Rt Breast Calcifications  . Depression   . Anxiety   . Plantar fasciitis of left foot     2013    Past Surgical History  Procedure Laterality Date  . Scoliosis repair with harrington rods    . Breast enhancement surgery    . Plastic surgry to eyes      There were no vitals filed for this visit.  Visit Diagnosis:  Pain, joint, knee, right - Plan: PT plan of care cert/re-cert  Stiffness in joint - Plan: PT plan of care cert/re-cert  Decreased strength, endurance, and mobility - Plan: PT plan of care cert/re-cert      Subjective Assessment - 11/22/15 1450    Subjective Patient reports that she has had Rt knee pain for 8=10 months. She tried PT without improvement. MRI showed torn meniscus. Underwent Rt knee scope 11/13/15. Still having some pain with straightening knee and at times with movement.    Pertinent History Arthritis in spine; HNP lumbar spine; DDD lumbar spine; scoliosis surgery 1983 Harrington rods   How long can you sit comfortably? no limit    How long can you stand comfortably? 5-10 min    How long can you walk comfortably? 10-15 min    Diagnostic tests xrays; MRI; US    Patient Stated Goals get off the  cane for walking and return to work at normal schedule; no pain in Rt knee; ascend stairs step over step; straighten knee without pain    Currently in Pain? Yes   Pain Score 1    Pain Location Knee   Pain Orientation Right   Pain Descriptors / Indicators Sharp  with walking    Pain Type Chronic pain   Pain Onset More than a month ago   Pain Frequency Intermittent   Aggravating Factors  standing; walking; stairs    Pain Relieving Factors rest; ice            [facility] PT Assessment - 11/22/15 0001    Assessment   Medical Diagnosis Rt knee scope    Referring Provider Dr. Milly JakobJohn Lee Graves   Onset Date/Surgical Date 11/13/15   Hand Dominance Left   Next MD Visit 12/16   Prior Therapy yes - here 06/16 to 07/16 no improvement    Precautions   Precautions None   Restrictions   Weight Bearing Restrictions No   Balance Screen   Has the patient fallen in the past 6 months Yes   How many times? 1   Has the patient had a decrease in activity level because of a fear of falling?  No  Is the patient reluctant to leave their home because of a fear of falling?  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; receptionsit every other weekend; helpd i nthe dining room    Leisure household chores; scrapbooking; dog walking    Observation/Other Assessments   Focus on Therapeutic Outcomes (FOTO)  54% limitation    Observation/Other Assessments-Edema    Edema --  minimal edema Rt knee   Sensation   Additional Comments WFL's per pt report    Posture/Postural Control   Posture Comments flexed forward; wt shifted to the Lt    AROM   Right Knee Extension 133   Right Knee Flexion -7   Left Knee Extension  0   Left Knee Flexion 143   Strength   Overall Strength Comments 5/5 Lt LE; 4+/5 to 5-/5 Rt hip; Rt knee not tested; Rt ankle WNL's    Flexibility   Hamstrings Rt 74 deg; Lt 85 deg    Ambulation/Gait   Gait Comments antalgic gait with single point cane; decreased wt bearing Rt LE; decreased Rt knee extension withstance phase Rt                    [facility] Adult PT Treatment/Exercise - 11/22/15 0001    Exercises   Exercises Knee/Hip   Knee/Hip Exercises: Stretches   Passive Hamstring Stretch Right;2 reps;30 seconds   Gastroc Stretch 2 reps;Right;30 seconds   Soleus Stretch 1 rep;Right   Soleus Stretch Limitations unable to feel stretch in soleus, only increased pain in Rt knee    Knee/Hip Exercises: Seated   Long Arc Quad Right;1 set;10 reps  painful arc on return to flexion.   Knee/Hip Exercises: Supine   Quad Sets Right;2 sets;15 reps   Short Arc Quad Sets Right;1 set;5 reps   Straight Leg Raise with External Rotation Strengthening;Right;2 sets;5 reps  (challenging)   Modalities   Modalities Vasopneumatic   Vasopneumatic   Number Minutes Vasopneumatic  15 minutes   Vasopnuematic Location  Knee   Vasopneumatic Pressure Low   Vasopneumatic Temperature  3*                PT Education - 11/22/15 1532    Education provided Yes   Education Details HEP   Person(s) Educated Patient   Methods Handout;Explanation   Comprehension Returned demonstration;Verbalized understanding          PT Short Term Goals - 11/22/15 1515    PT SHORT TERM GOAL #1   Title Paient I in initial HEP (12/13/15)   Time 3   Period Weeks   Status New   PT SHORT TERM GOAL #2   Title Increased hamstring flexibility to 85 deg Rt 12/13/15   Time 3   Period Weeks   Status New   PT SHORT TERM GOAL #3   Title Full Rt knee ROM = to Lt 12/13/15   Time 3   Period Weeks   Status New           PT Long Term Goals - 11/22/15 1516    PT LONG TERM GOAL #1   Title Patient I in  advanced HEP (01/03/16)   Time 6   Period Weeks   Status  New   PT LONG TERM GOAL #2   Title Improve gait pattern with no assistive device 01/03/16   Time 6   Period Weeks   Status New   PT LONG TERM GOAL #3   Title Increased bilat hip extension strength to 5/5 (01/03/16)   Time 6   Period Weeks   Status New   PT LONG TERM GOAL #4   Title Ascend and descend stairs step over step 01/03/16   Time 6   Period Weeks   Status New   PT LONG TERM GOAL #5   Title Improve FOTO to </= 42% limitation 01/03/16   Time 6   Period Weeks   Status New               Plan - 11/22/15 1512    Clinical Impression Statement [name] presents s/p Rt knee scope for meniscus tear 11/13/15. She has pain; limited ROM; decreased strength; abnormal gait; decreased endurance and limited functional activity level. Pt will benefit form PT to address problems as identified.     Pt will benefit from skilled therapeutic intervention in order to improve on the following deficits Postural dysfunction;Abnormal gait;Decreased range of motion;Decreased mobility;Decreased strength;Pain;Decreased endurance;Decreased activity tolerance   Rehab Potential Good   PT Frequency 2x / week   PT Duration 6 weeks   PT Treatment/Interventions Patient/family education;ADLs/Self Care Home Management;Therapeutic activities;Therapeutic exercise;Manual techniques;Dry needling;Cryotherapy;Electrical Stimulation;Moist Heat;Ultrasound   PT Next Visit Plan Progress with strengthening; gait training   PT Home Exercise Plan HEP    Consulted and Agree with Plan of Care Patient         Problem List Patient Active Problem List   Diagnosis Date Noted  . Loose stools 07/26/2015  . Trapezius strain 07/26/2015  . Right knee meniscal tear 05/10/2015  . Lumbar spondylosis 05/10/2015  . Postmenopausal atrophic vaginitis 12/18/2014  . COPD, mild (HCC) 03/30/2014  . Generalized anxiety disorder 01/14/2012  . HEARING DEFICIT 05/29/2011  .  PANIC DISORDER 08/12/2010  . LOW BACK PAIN, CHRONIC 07/16/2010  . PELVIC FRACTURE 04/15/2010  . DECREASED LIBIDO 06/26/2009  . Major depressive disorder, recurrent episode, severe (HCC) 12/03/2008  . SCOLIOSIS 11/06/2008  . Osteopenia 09/25/2008  . POSTMENOPAUSAL STATUS 09/11/2008  . Hyperlipidemia 05/28/2008  . ARTHRITIS, HIPS, BILATERAL 02/21/2008  . APHTHOUS ULCERS 11/11/2007  . Hypothyroidism 10/05/2007  . URINARY TRACT INFECTION, RECURRENT 07/18/2007  . SACROILIITIS, RIGHT 07/18/2007    Celyn Rober Minion PT, MPH  11/22/2015, 4:31 PM  [facility] [address] [place], [place], [address] Phone: [phone]   Fax:  [phone]  Name: [name] MRN: [id number] Date of Birth: 09/23/1956

## 2015-11-22 NOTE — Patient Instructions (Signed)
Quad Set    With other leg bent, foot flat, slowly tighten muscles on thigh of straight leg while counting out loud to _10___. Repeat with other leg. Repeat __10__ times. Do __1-2__ sessions per day.  [email or web address].[email or web address]   Straight Leg Raise: With External Leg Rotation    Lie on back with right leg straight, opposite leg bent. Rotate straight leg out and lift __6-8__ inches. Repeat _5___ times per set. Do _2___ sets per session. Do _1___ sessions per day.  http://[email or web address].[email or web address]    Hamstring Step 2    Left foot relaxed, knee straight, other leg bent, foot flat. Raise straight leg further upward to maximal range. Hold _30__ seconds. Relax leg completely down. Repeat 2 x.   Calf Stretch    Place one leg forward, bent, other leg behind and straight. Lean forward keeping back heel flat. Hold __30__ seconds while counting out loud. Repeat with other leg forward. Repeat __2__ times. Do _1-2___ sessions per day.  [email or web address].[email or web address]    [facility] [address] [place], [place] [address]  [phone] (office) [phone] (fax)

## 2015-11-22 NOTE — Progress Notes (Signed)
  Subjective:    CC: Follow-up after her epidural  HPI: This is a pleasant 59 year old female, we have been treating her lumbar spondylosis for sometime now, previously we had requested a L5-S1 interlaminar epidural, this pain was an excessive: Since the epidural was performed as a selective S1 block, unfortunately she continued to have pain so we obtained another epidural, this time the liver placement of the medication at the L5-S1 interlaminar interspace. She returns today with pain completely resolved.  Past medical history, Surgical history, Family history not pertinant except as noted below, Social history, Allergies, and medications have been entered into the medical record, reviewed, and no changes needed.   Review of Systems: No fevers, chills, night sweats, weight loss, chest pain, or shortness of breath.   Objective:    General: Well Developed, well nourished, and in no acute distress. Tearful in exam room Neuro: Alert and oriented x3, extra-ocular muscles intact, sensation grossly intact.  HEENT: Normocephalic, atraumatic, pupils equal round reactive to light, neck supple, no masses, no lymphadenopathy, thyroid nonpalpable.  Skin: Warm and dry, no rashes. Cardiac: Regular rate and rhythm, no murmurs rubs or gallops, no lower extremity edema.  Respiratory: Clear to auscultation bilaterally. Not using accessory muscles, speaking in full sentences.  Impression and Recommendations:    I spent 25 minutes with this patient, greater than 50% was face-to-face time counseling regarding the above diagnoses

## 2015-11-27 ENCOUNTER — Ambulatory Visit (INDEPENDENT_AMBULATORY_CARE_PROVIDER_SITE_OTHER): Payer: 59 | Admitting: Physical Therapy

## 2015-11-27 DIAGNOSIS — Z7409 Other reduced mobility: Secondary | ICD-10-CM | POA: Diagnosis not present

## 2015-11-27 DIAGNOSIS — R531 Weakness: Secondary | ICD-10-CM

## 2015-11-27 DIAGNOSIS — M25561 Pain in right knee: Secondary | ICD-10-CM

## 2015-11-27 DIAGNOSIS — M2560 Stiffness of unspecified joint, not elsewhere classified: Secondary | ICD-10-CM

## 2015-11-27 NOTE — Therapy (Signed)
[facility] [place] [address] [place], [place], [address] Phone: [phone]   Fax:  [phone]  Physical Therapy Treatment  Patient Details  Name: [name] MRN: [id number] Date of Birth: 26-Feb-1956 Referring Provider: Dr. Jodi Geralds  Encounter Date: 11/27/2015      PT End of Session - 11/27/15 1426    Visit Number 2   Number of Visits 12   Date for PT Re-Evaluation 01/03/16   PT Start Time 1429   PT Stop Time 1523   PT Time Calculation (min) 54 min   Activity Tolerance Patient tolerated treatment well      Past Medical History  Diagnosis Date  . Hypercholesterolemia   . Arthritis     Right Hip  . Hypothyroidism   . Chronic cystitis   . Dyspareunia   . Breast disorder      Rt Breast Calcifications  . Depression   . Anxiety   . Plantar fasciitis of left foot     2013    Past Surgical History  Procedure Laterality Date  . Scoliosis repair with harrington rods    . Breast enhancement surgery    . Plastic surgry to eyes      There were no vitals filed for this visit.  Visit Diagnosis:  Pain, joint, knee, right  Stiffness in joint  Decreased strength, endurance, and mobility      Subjective Assessment - 11/27/15 1430    Subjective Pt reports she just drove 2 hours; is very stiff in the knee.  Has stiffness/pain first thing in morning, but it quickly resolves with movement. Pt reports she hasn't performed any exercises from her HEP.    Currently in Pain? No/denies            [facility] PT Assessment - 11/27/15 0001    Assessment   Medical Diagnosis Rt knee scope    Referring Provider Dr. Jodi Geralds   Onset Date/Surgical Date 11/13/15   Hand Dominance Left   AROM   Right Knee Extension 0   Right Knee Flexion 142   Left Knee Extension 0   Left Knee Flexion 150           [facility] Adult PT Treatment/Exercise - 11/27/15 0001    Knee/Hip Exercises: Stretches   Lobbyist Right;2 reps;30  seconds   Quad Stretch Limitations (prone with strap)   Gastroc Stretch Right;Left;2 reps;30 seconds   Knee/Hip Exercises: Aerobic   Nustep L3: 5.5 min    Knee/Hip Exercises: Standing   Heel Raises Both;10 reps;3 sets  toes straight, toes out   Lateral Step Up Right;1 set;10 reps;Step Height: 6";Hand Hold: 2   Lateral Step Up Limitations (challenging)  VC to avoid swiveling in hips during ascent   Forward Step Up Right;1 set;Hand Hold: 2;Step Height: 6"   Step Down 1 set;Hand Hold: 2;Step Height: 6";Left;15 reps   SLS Rt 3 trials:  9 sec, 12 sec, 14 sec    Knee/Hip Exercises: Supine   Quad Sets Right;1 set;10 reps  with heel prop, 10 sec hold   Straight Leg Raise with External Rotation Right;1 set;10 reps  challenging   Modalities   Modalities Vasopneumatic   Vasopneumatic   Number Minutes Vasopneumatic  15 minutes   Vasopnuematic Location  Knee   Vasopneumatic Pressure Medium   Vasopneumatic Temperature  3*           PT Short Term Goals - 11/22/15 1515    PT SHORT TERM GOAL #1  Title Paient I in initial HEP (12/13/15)   Time 3   Period Weeks   Status New   PT SHORT TERM GOAL #2   Title Increased hamstring flexibility to 85 deg Rt 12/13/15   Time 3   Period Weeks   Status New   PT SHORT TERM GOAL #3   Title Full Rt knee ROM = to Lt 12/13/15   Time 3   Period Weeks   Status New           PT Long Term Goals - 11/22/15 1516    PT LONG TERM GOAL #1   Title Patient I in advanced HEP (01/03/16)   Time 6   Period Weeks   Status New   PT LONG TERM GOAL #2   Title Improve gait pattern with no assistive device 01/03/16   Time 6   Period Weeks   Status New   PT LONG TERM GOAL #3   Title Increased bilat hip extension strength to 5/5 (01/03/16)   Time 6   Period Weeks   Status New   PT LONG TERM GOAL #4   Title Ascend and descend stairs step over step 01/03/16   Time 6   Period Weeks   Status New   PT LONG TERM GOAL #5   Title Improve FOTO to </= 42%  limitation 01/03/16   Time 6   Period Weeks   Status New               Plan - 11/27/15 1508    Clinical Impression Statement Pt tolerated all exercises well, some discomfort with stairs.  Pt required some VC for good form. Pt now walking without SPC with slight limp, but no increased pain.  Pt demonstrated improved Rt knee ROM this visit. Progressing well towards goals.  Pt encouarged to comply with HEP to assist in meeting stated goals.    Pt will benefit from skilled therapeutic intervention in order to improve on the following deficits Postural dysfunction;Abnormal gait;Decreased range of motion;Decreased mobility;Decreased strength;Pain;Decreased endurance;Decreased activity tolerance   Rehab Potential Good   PT Frequency 2x / week   PT Duration 6 weeks   PT Treatment/Interventions Patient/family education;ADLs/Self Care Home Management;Therapeutic activities;Therapeutic exercise;Manual techniques;Dry needling;Cryotherapy;Electrical Stimulation;Moist Heat;Ultrasound   PT Next Visit Plan Progress with strengthening to Rt LE.    Consulted and Agree with Plan of Care Patient        Problem List Patient Active Problem List   Diagnosis Date Noted  . Loose stools 07/26/2015  . Trapezius strain 07/26/2015  . Right knee meniscal tear 05/10/2015  . Lumbar spondylosis 05/10/2015  . Postmenopausal atrophic vaginitis 12/18/2014  . COPD, mild (HCC) 03/30/2014  . Generalized anxiety disorder 01/14/2012  . HEARING DEFICIT 05/29/2011  . PANIC DISORDER 08/12/2010  . LOW BACK PAIN, CHRONIC 07/16/2010  . PELVIC FRACTURE 04/15/2010  . DECREASED LIBIDO 06/26/2009  . Major depressive disorder, recurrent episode, severe (HCC) 12/03/2008  . SCOLIOSIS 11/06/2008  . Osteopenia 09/25/2008  . POSTMENOPAUSAL STATUS 09/11/2008  . Hyperlipidemia 05/28/2008  . ARTHRITIS, HIPS, BILATERAL 02/21/2008  . APHTHOUS ULCERS 11/11/2007  . Hypothyroidism 10/05/2007  . URINARY TRACT INFECTION, RECURRENT  07/18/2007  . SACROILIITIS, RIGHT 07/18/2007   Mayer CamelJennifer Carlson-Long, PTA 11/27/2015 3:18 PM  [facility] [place] [address] [place], [place], [address] Phone: [phone]   Fax:  [phone]  Name: Roslynn Ambleancy Bundren MRN: [id number] Date of Birth: Dec 31, 1955

## 2015-11-29 ENCOUNTER — Encounter: Payer: Self-pay | Admitting: Physical Therapy

## 2015-12-02 ENCOUNTER — Ambulatory Visit (INDEPENDENT_AMBULATORY_CARE_PROVIDER_SITE_OTHER): Payer: 59 | Admitting: Physical Therapy

## 2015-12-02 DIAGNOSIS — R531 Weakness: Secondary | ICD-10-CM

## 2015-12-02 DIAGNOSIS — M2560 Stiffness of unspecified joint, not elsewhere classified: Secondary | ICD-10-CM | POA: Diagnosis not present

## 2015-12-02 DIAGNOSIS — Z7409 Other reduced mobility: Secondary | ICD-10-CM

## 2015-12-02 DIAGNOSIS — M25561 Pain in right knee: Secondary | ICD-10-CM | POA: Diagnosis not present

## 2015-12-02 DIAGNOSIS — R6889 Other general symptoms and signs: Secondary | ICD-10-CM

## 2015-12-02 NOTE — Therapy (Addendum)
[place], [place], [address] Phone: [phone]   Fax:  [phone]  Physical Therapy Treatment  Patient Details  Name: [name] MRN: [id number] Date of Birth: Apr 27, 1956 Referring Provider: Dr. Dorna Leitz  Encounter Date: 12/02/2015      PT End of Session - 12/02/15 1430    Visit Number 3   Number of Visits 12   Date for PT Re-Evaluation 01/03/16   PT Start Time 1424   PT Stop Time 1510   PT Time Calculation (min) 46 min   Activity Tolerance Patient tolerated treatment well;No increased pain      Past Medical History  Diagnosis Date  . Hypercholesterolemia   . Arthritis     Right Hip  . Hypothyroidism   . Chronic cystitis   . Dyspareunia   . Breast disorder      Rt Breast Calcifications  . Depression   . Anxiety   . Plantar fasciitis of left foot     2013    Past Surgical History  Procedure Laterality Date  . Scoliosis repair with harrington rods    . Breast enhancement surgery    . Plastic surgry to eyes      There were no vitals filed for this visit.  Visit Diagnosis:  Decreased strength, endurance, and mobility  Pain, joint, knee, right  Stiffness in joint      Subjective Assessment - 12/02/15 1428    Subjective Pt reports she was able to go down a flight of stairs with reciprocal pattern, with some stiffness but no buckling. Feels like things are going well.    Currently in Pain? No/denies  stiffness             [facility] PT Assessment - 12/02/15 0001    Assessment   Medical Diagnosis Rt knee scope    Referring Provider Dr. Dorna Leitz   Onset Date/Surgical Date 11/13/15   Hand Dominance Left   Next MD Visit 12/16   Observation/Other Assessments   Focus on Therapeutic Outcomes (FOTO)  56% limited    AROM   Right Knee Extension 0   Right Knee Flexion 150   Left Knee Extension 0   Left Knee Flexion 150   Flexibility   Hamstrings Rt/Lt ~85-88 deg            [facility] Adult PT Treatment/Exercise - 12/02/15 0001    Knee/Hip Exercises: Stretches   Passive Hamstring Stretch Right;Left;2 reps;30 seconds  standing    Quad Stretch Right;Left;2 reps;30 seconds  2 sets   Sports administrator Limitations standing   Gastroc Stretch Right;Left;2 reps;30 seconds   Knee/Hip Exercises: Aerobic   Nustep L4: 5.5 min    Knee/Hip Exercises: Standing   Heel Raises Both;2 sets;1 second   Lateral Step Up Right;1 set;10 reps;Step Height: 6";Hand Hold: 2   Forward Step Up Right;1 set;10 reps;Hand Hold: 2;Step Height: 6"   Step Down Hand Hold: 2;Left;20 reps  3", (6" was too difficult)   SLS Rt LE: 3 trials - 13 sec, 16 sec, 5 sec    Knee/Hip Exercises: Supine   Straight Leg Raises Right;Strengthening;1 set;10 reps  VC for TKE before lifting   Knee/Hip Exercises: Prone   Hip Extension Strengthening;Right;1 set;10 reps            PT Education - 12/02/15 1506    Education provided Yes   Education Details reviewed current HEP and instructed on strategies for lunging to floor to clean.  Person(s) Educated Patient   Methods Explanation   Comprehension Verbalized understanding;Returned demonstration          PT Short Term Goals - 12/02/15 1638    PT SHORT TERM GOAL #1   Title Paient I in initial HEP (12/13/15)   Time 3   Period Weeks   Status Achieved   PT SHORT TERM GOAL #2   Title Increased hamstring flexibility to 85 deg Rt 12/13/15   Time 3   Period Weeks   Status Achieved   PT SHORT TERM GOAL #3   Title Full Rt knee ROM = to Lt 12/13/15   Time 3   Period Weeks   Status Achieved           PT Long Term Goals - 12/02/15 1439    PT LONG TERM GOAL #1   Title Patient I in advanced HEP (01/03/16)   Time 6   Period Weeks   Status Achieved   PT LONG TERM GOAL #2   Title Improve gait pattern with no assistive device 01/03/16   Time 6   Period Weeks   Status Achieved   PT LONG TERM GOAL #3   Title Increased bilat hip extension  strength to 5/5 (01/03/16)   Time 6   Period Weeks   Status Achieved   PT LONG TERM GOAL #4   Title Ascend and descend stairs step over step 01/03/16   Time 6   Period Weeks   Status Achieved   PT LONG TERM GOAL #5   Title Improve FOTO to </= 42% limitation 01/03/16   Time 6   Period Weeks   Status Not Met               Plan - 12/02/15 1504    Clinical Impression Statement Pt tolerated all exercises well, with improved performance on stair exercises.  Pt has met all her goals except FOTO,  and has requested to d/c to HEP.     Pt will benefit from skilled therapeutic intervention in order to improve on the following deficits Postural dysfunction;Abnormal gait;Decreased range of motion;Decreased mobility;Decreased strength;Pain;Decreased endurance;Decreased activity tolerance   Rehab Potential Good   PT Frequency 2x / week   PT Duration 6 weeks   PT Treatment/Interventions Patient/family education;ADLs/Self Care Home Management;Therapeutic activities;Therapeutic exercise;Manual techniques;Dry needling;Cryotherapy;Electrical Stimulation;Moist Heat;Ultrasound   PT Next Visit Plan Spoke to supervising PT; will d/c to HEP.    Consulted and Agree with Plan of Care Patient        Problem List Patient Active Problem List   Diagnosis Date Noted  . Loose stools 07/26/2015  . Trapezius strain 07/26/2015  . Right knee meniscal tear 05/10/2015  . Lumbar spondylosis 05/10/2015  . Postmenopausal atrophic vaginitis 12/18/2014  . COPD, mild ([place]) 03/30/2014  . Generalized anxiety disorder 01/14/2012  . HEARING DEFICIT 05/29/2011  . PANIC DISORDER 08/12/2010  . LOW BACK PAIN, CHRONIC 07/16/2010  . PELVIC FRACTURE 04/15/2010  . DECREASED LIBIDO 06/26/2009  . Major depressive disorder, recurrent episode, severe ([place]) 12/03/2008  . SCOLIOSIS 11/06/2008  . Osteopenia 09/25/2008  . POSTMENOPAUSAL STATUS 09/11/2008  . Hyperlipidemia 05/28/2008  . ARTHRITIS, HIPS, BILATERAL 02/21/2008   . APHTHOUS ULCERS 11/11/2007  . Hypothyroidism 10/05/2007  . URINARY TRACT INFECTION, RECURRENT 07/18/2007  . SACROILIITIS, RIGHT 07/18/2007   Kerin Perna, PTA 12/02/2015 4:38 PM  [place], [place], [address] Phone: [phone]   Fax:  [phone]  Name: [name] MRN:  [id number] Date of Birth: April 13, 1956    PHYSICAL THERAPY DISCHARGE SUMMARY  Visits from Start of Care: 3  Current functional level related to goals / functional outcomes: Pleased with progress    Remaining deficits: Needs to continue with I HEP to gain strength in LE    Education / Equipment: HEP  Plan: Patient agrees to discharge.  Patient goals were partially met. Patient is being discharged due to being pleased with the current functional level.  ?????     Celyn P. Helene Kelp PT, MPH 12/09/2015 9:49 AM

## 2015-12-04 ENCOUNTER — Encounter: Payer: Self-pay | Admitting: Physical Therapy

## 2015-12-06 ENCOUNTER — Other Ambulatory Visit: Payer: Self-pay | Admitting: Family Medicine

## 2015-12-09 ENCOUNTER — Encounter: Payer: Self-pay | Admitting: Physical Therapy

## 2015-12-10 ENCOUNTER — Other Ambulatory Visit: Payer: Self-pay | Admitting: Family Medicine

## 2015-12-10 DIAGNOSIS — Z1231 Encounter for screening mammogram for malignant neoplasm of breast: Secondary | ICD-10-CM

## 2015-12-11 ENCOUNTER — Encounter: Payer: Self-pay | Admitting: Physical Therapy

## 2015-12-18 ENCOUNTER — Encounter: Payer: Self-pay | Admitting: Physical Therapy

## 2015-12-19 ENCOUNTER — Telehealth (HOSPITAL_COMMUNITY): Payer: Self-pay

## 2015-12-20 ENCOUNTER — Encounter: Payer: Self-pay | Admitting: Physical Therapy

## 2015-12-20 ENCOUNTER — Other Ambulatory Visit: Payer: Self-pay | Admitting: Family Medicine

## 2015-12-20 ENCOUNTER — Ambulatory Visit: Payer: 59

## 2015-12-20 ENCOUNTER — Telehealth: Payer: Self-pay | Admitting: Family Medicine

## 2015-12-20 DIAGNOSIS — Z1231 Encounter for screening mammogram for malignant neoplasm of breast: Secondary | ICD-10-CM

## 2015-12-20 DIAGNOSIS — R197 Diarrhea, unspecified: Secondary | ICD-10-CM

## 2015-12-20 NOTE — Telephone Encounter (Signed)
LVM for pt to return call

## 2015-12-25 ENCOUNTER — Ambulatory Visit: Payer: Self-pay | Admitting: Obstetrics & Gynecology

## 2015-12-25 NOTE — Telephone Encounter (Signed)
No longer need phone note

## 2016-01-06 ENCOUNTER — Encounter (HOSPITAL_COMMUNITY): Payer: Self-pay | Admitting: Psychiatry

## 2016-01-06 ENCOUNTER — Ambulatory Visit (INDEPENDENT_AMBULATORY_CARE_PROVIDER_SITE_OTHER): Payer: Self-pay | Admitting: Psychiatry

## 2016-01-06 ENCOUNTER — Ambulatory Visit (INDEPENDENT_AMBULATORY_CARE_PROVIDER_SITE_OTHER): Payer: 59 | Admitting: Obstetrics & Gynecology

## 2016-01-06 ENCOUNTER — Encounter: Payer: Self-pay | Admitting: Obstetrics & Gynecology

## 2016-01-06 VITALS — Wt 161.0 lb

## 2016-01-06 VITALS — BP 115/75 | HR 72 | Resp 16 | Ht 68.0 in | Wt 160.0 lb

## 2016-01-06 DIAGNOSIS — E559 Vitamin D deficiency, unspecified: Secondary | ICD-10-CM | POA: Diagnosis not present

## 2016-01-06 DIAGNOSIS — Z01419 Encounter for gynecological examination (general) (routine) without abnormal findings: Secondary | ICD-10-CM

## 2016-01-06 DIAGNOSIS — F331 Major depressive disorder, recurrent, moderate: Secondary | ICD-10-CM

## 2016-01-06 DIAGNOSIS — F4321 Adjustment disorder with depressed mood: Secondary | ICD-10-CM

## 2016-01-06 DIAGNOSIS — Z124 Encounter for screening for malignant neoplasm of cervix: Secondary | ICD-10-CM | POA: Diagnosis not present

## 2016-01-06 DIAGNOSIS — Z1151 Encounter for screening for human papillomavirus (HPV): Secondary | ICD-10-CM

## 2016-01-06 DIAGNOSIS — N952 Postmenopausal atrophic vaginitis: Secondary | ICD-10-CM | POA: Diagnosis not present

## 2016-01-06 DIAGNOSIS — F411 Generalized anxiety disorder: Secondary | ICD-10-CM

## 2016-01-06 MED ORDER — ESTRADIOL 0.1 MG/GM VA CREA: 1.0000 | TOPICAL_CREAM | Freq: Every day | VAGINAL | Status: DC

## 2016-01-06 NOTE — Progress Notes (Signed)
Patient ID: [name], female   DOB: [birth year], 60 y.o.   MRN: [id number]   [facility]  [name] [birth year]  Date:11/04/2015  Chief Complaint:  Follow Up. Depression and anxiety  History of Chief Complaint:   HPI Comments: [name] is a 60 y/o female with a past psychiatric history significant for Major Depression, Recurrent severe, generalized anxiety and bereavement. The patient was referred for psychiatric services for medication management.    . Location:  Her depression dates back to 2008 when she lost her mom and also her brother was using drugs. She is able to deal with those stressors now her brother is not using. She recently had a knee surgery and stopped her medications. She is now back on Prozac but not on BuSpar. She thinks she wants to cut down slowly and wants to get off all medication Prozac  In the past. Tried to cut down the dose and she felt depressed. She understands she has to wait for spring or less stressful time She gets lonely and that upsets her.   Work keeps her busy. Bereavement: anniversary of her mom and dad is around mothers and fathers day month. She has cut down her buspirone to bid .   Anxiety: related to stress and anniversaries. meds help including buspirone.  . Severity: Depression:6/10 (0=Very depressed; 5=Neutral; 10=Very Happy)  Anxiety- 0-1/10 (0=no anxiety; 5= moderate/tolerable anxiety; 10= panic attacks)  . Duration: The patient notes depression since her early 8430's; and worsened since 2008 with the death of her parents.   . Timing: Mood is worse n the afternoon at 5 PM.  . Context: Being late. Being afraid she will "screw up at work."  . Modifying factors: Mood improves with exercise.  No psychotic symptoms     Review of Systems  Constitutional: Negative for fever.  Cardiovascular: Negative for chest pain.  Skin: Negative for rash.  Neurological: Negative for tingling and  tremors.  Psychiatric/Behavioral: Negative for suicidal ideas and substance abuse. The patient is not nervous/anxious.      Filed Vitals:   01/06/16 1448  Weight: 161 lb (73.029 kg)   Physical Exam  Vitals reviewed.  Constitutional: She appears well-developed and well-nourished. No distress.  Skin: She is not diaphoretic.   Musculoskeletal: Strength & Muscle Tone: within normal limits Gait & Station: normal Patient leans: N/A   Past Medical History: Reviewed Past Medical History  Diagnosis Date  . Hypercholesterolemia   . Arthritis     Right Hip  . Hypothyroidism   . Chronic cystitis   . Dyspareunia   . Breast disorder      Rt Breast Calcifications  . Depression   . Anxiety   . Plantar fasciitis of left foot     2013   Allergies: Reviewed Allergies  Allergen Reactions  . Bupropion Hcl     REACTION: irritabillity  . Sulfa Antibiotics Itching   Current Medications: Reviewed Current Outpatient Prescriptions on File Prior to Visit  Medication Sig Dispense Refill  . albuterol (PROAIR HFA) 108 (90 BASE) MCG/ACT inhaler Inhale 2 puffs into the lungs every 6 (six) hours as needed for wheezing or shortness of breath. Before exercise. 1 Inhaler 0  . calcium carbonate (OS-CAL) 600 MG TABS tablet Take 600 mg by mouth 2 (two) times daily with a meal.     . calcium-vitamin D (OSCAL WITH D) 500-200 MG-UNIT per tablet Take 2 tablets by mouth daily.     . cholecalciferol (VITAMIN D)  1000 UNITS tablet Take 1,000 Units by mouth 2 (two) times daily.    . clonazePAM (KLONOPIN) 0.5 MG tablet TAKE 1 TABLET BY MOUTH DAILY AS NEEDED 90 tablet 0  . FLUoxetine (PROZAC) 40 MG capsule Take 2 a day. Total of 80 mg 135 capsule 0  . HYDROcodone-acetaminophen (NORCO) 10-325 MG tablet Take 1 tablet by mouth 2 (two) times daily as needed. 60 tablet 0  . levothyroxine (SYNTHROID, LEVOTHROID) 88 MCG tablet TAKE 1 TABLET (88 MCG TOTAL) BY MOUTH DAILY BEFORE BREAKFAST. 90 tablet 0  . Multiple Vitamin  (MULTIVITAMIN) tablet Take 1 tablet by mouth daily.      . nitrofurantoin (MACRODANTIN) 50 MG capsule Take 50 mg by mouth daily as needed.    . valACYclovir (VALTREX) 500 MG tablet Take 500 mg by mouth daily as needed.     No current facility-administered medications on file prior to visit.   Previous Psychotropic Medications: Reviewed Medication   Prozac   Clonazapam   Buspirone   Alprazolam.    Substance Abuse History in the last 12 months: Reviewed Social History  Substance Use Topics  . Smoking status: Former Smoker    Quit date: 09/20/1982  . Smokeless tobacco: Never Used  . Alcohol Use: 0.0 oz/week    0.5 drink(s) per week     Comment: Use is 1-2 a month.  Caffeine: 2 cups a day   Family History: Reviewed Family History   Problem  Relation  Age of Onset   .  COPD     .  Aneurysm     .  Anxiety disorder  Mother    .  Depression  Mother    .  Alcohol abuse  Father    .  Heart murmur  Father    .  Drug abuse  Brother    .  Anxiety disorder  Brother    .  Depression  Brother    .  Dementia  Maternal Grandfather     Psychiatric Specialty Exam:  Objective: Appearance: Casual and Well Groomed   Eye Contact:: Good   Speech: Clear and Coherent and Normal Rate   Volume: Normal   Mood: not hopeless. Feels euthymic   Affect: Congruent   Thought Process: Coherent, Logical and Loose   Orientation: Full   Thought Content: WDL   Suicidal Thoughts: No   Homicidal Thoughts: No   Judgement: Fair   Insight: Fair   Psychomotor Activity: Normal   Akathisia: No   Handed: Left   Memory: Immediate 3/3, recent: 1/3   . 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 Generalized anxiety disorder.  bereavement  AXIS II  No diagnosis   AXIS III  Past Medical History    Diagnosis  Date    .  Hypercholesterolemia     .  Arthritis       Right Hip    .  Hypothyroidism     .  Chronic cystitis     .  Decreased libido     .  Dyspareunia     .  Breast disorder       Rt Breast Calcifications    .  Depression     .  Anxiety     .  Plantar fasciitis of left foot       2013      AXIS IV  other psychosocial or environmental problems   AXIS V  65   Treatment Plan/Recommendations:   Plan of Care:  PLAN:  1. Affirm with the patient that the medications are taken as ordered. Patient  expressed understanding of how their medications were to be used.    Laboratory:  No labs warranted at this time.    Psychotherapy: Therapy: brief supportive therapy provided.  Discussed psychosocial stressors in detail. More than 50% of the visit was spent on individual therapy/counseling.   Medications:  Continue  the following psychiatric medications as written prior to this appointment with the following changes:: continue following for depression, anxiety.  A)prozac continue 80 mg qd. Refill given b) stop buspirone. Says not using it.  c) Asked patient to limit and try to discontinue clonazepam. She is seldom using it.  Bereavement ; baseline. Not worsened.    -Risks and benefits, side effects and alternatives discussed with patient, she was given an opportunity to ask questions about her medication, illness, and treatment. All current psychiatric medications have been reviewed and discussed with the patient and adjusted as clinically appropriate. The patient has been provided an accurate and updated list of the medications being now prescribed.   Routine PRN Medications:  Negative  Consultations: The patient was encouraged to keep all PCP and specialty clinic appointments. Advised her to go to PCP for exertional dyspnea.  Safety Concerns:   Patient told to call clinic if any problems occur. Patient  advised to go to  ER  if she should develop SI/HI, side effects, or if symptoms worsen. Has crisis numbers to call if needed.    Other:   8. Patient was instructed to return to clinic in 2 month. 9. The patient was advised to call and cancel their mental health appointment within 24 hours of the appointment, if they are unable to keep the appointment, as well as the three no show and termination from clinic policy. 10. The patient expressed understanding of the plan and agrees with the above.  Time spent: 25 minutes.     Thresa Ross, MD  01/06/2016 2:49 PM

## 2016-01-06 NOTE — Progress Notes (Signed)
  Subjective:    Roslynn Ambleancy Winther is a 60 y.o. female who presents for an annual exam. The patient is c/o vaginal burning and dyspareunia.  Pt has had this complaint for >1 year.  She could not afford the Vagifem.  Pt uses  The patient is not currently sexually active. GYN screening history: last pap: approximate date 2013 and was normal. The patient wears seatbelts: yes.   Menstrual History: OB History    Gravida Para Term Preterm AB TAB SAB Ectopic Multiple Living   0                No LMP recorded. Patient is postmenopausal.    The following portions of the patient's history were reviewed and updated as appropriate: allergies, current medications, past family history, past medical history, past social history, past surgical history and problem list.  Review of Systems Pertinent items noted in HPI and remainder of comprehensive ROS otherwise negative.    Objective:      Filed Vitals:   01/06/16 1503  BP: 115/75  Pulse: 72  Resp: 16  Height: 5\' 8"  (1.727 m)  Weight: 160 lb (72.576 kg)   Vitals:  WNL General appearance: alert, cooperative and no distress  HEENT: Normocephalic, without obvious abnormality, atraumatic Eyes: negative Throat: lips, mucosa, and tongue normal; teeth and gums normal  Respiratory: Clear to auscultation bilaterally  CV: Regular rate and rhythm  Breasts:  Normal appearance, no masses or tenderness, no nipple retraction or dimpling. Implants  GI: Soft, non-tender; bowel sounds normal; no masses,  no organomegaly  GU: External Genitalia:  Tanner V, no lesion Urethra:  No prolapse   Vagina: Pale epithelium, very friable, decreased elasticity, very atrohpic  Cervix: No CMT, portio bleeds with pap (atrophic)  Uterus:  Normal size and contour, non tender  Adnexa: Normal, no masses, non tender  Musculoskeletal: No edema, redness or tenderness in the calves or thighs  Skin: No lesions or rash  Lymphatic: Axillary adenopathy: none    Psychiatric: Normal  mood and behavior      .    Assessment:    Healthy female exam.   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

## 2016-01-07 ENCOUNTER — Telehealth: Payer: Self-pay | Admitting: *Deleted

## 2016-01-07 LAB — WET PREP BY MOLECULAR PROBE
CANDIDA SPECIES: NEGATIVE
Gardnerella vaginalis: NEGATIVE
Trichomonas vaginosis: NEGATIVE

## 2016-01-07 LAB — VITAMIN D 25 HYDROXY (VIT D DEFICIENCY, FRACTURES): VIT D 25 HYDROXY: 31 ng/mL (ref 30–100)

## 2016-01-07 NOTE — Telephone Encounter (Signed)
Lm message on voicemail of neg wet prep and Vitamin D was within normal range.

## 2016-01-08 LAB — CYTOLOGY - PAP

## 2016-01-08 NOTE — Telephone Encounter (Signed)
Pt aware that even though Vit D was WNL she needs to stay on her OTC Vitamin D.

## 2016-01-24 ENCOUNTER — Encounter: Payer: Self-pay | Admitting: Sports Medicine

## 2016-01-24 ENCOUNTER — Ambulatory Visit (INDEPENDENT_AMBULATORY_CARE_PROVIDER_SITE_OTHER): Payer: 59 | Admitting: Sports Medicine

## 2016-01-24 DIAGNOSIS — M4726 Other spondylosis with radiculopathy, lumbar region: Secondary | ICD-10-CM | POA: Diagnosis not present

## 2016-01-24 MED ORDER — HYDROCODONE-ACETAMINOPHEN 10-325 MG PO TABS: 1.0000 | ORAL_TABLET | Freq: Two times a day (BID) | ORAL | Status: DC | PRN

## 2016-01-24 NOTE — Progress Notes (Signed)
  Subjective:    CC: Follow-up  HPI: Lumbar spondylosis: Did well with a right L5-S1 interlaminar epidural 3 months ago, now having recurrence of pain and desires a repeat injection. No bowel or bladder dysfunction, saddle numbness, constitutional symptoms, or trauma.  Past medical history, Surgical history, Family history not pertinant except as noted below, Social history, Allergies, and medications have been entered into the medical record, reviewed, and no changes needed.   Review of Systems: No fevers, chills, night sweats, weight loss, chest pain, or shortness of breath.   Objective:    General: Well Developed, well nourished, and in no acute distress.  Neuro: Alert and oriented x3, extra-ocular muscles intact, sensation grossly intact.  HEENT: Normocephalic, atraumatic, pupils equal round reactive to light, neck supple, no masses, no lymphadenopathy, thyroid nonpalpable.  Skin: Warm and dry, no rashes. Cardiac: Regular rate and rhythm, no murmurs rubs or gallops, no lower extremity edema.  Respiratory: Clear to auscultation bilaterally. Not using accessory muscles, speaking in full sentences.  Impression and Recommendations:   I spent 25 minutes with this patient, greater than 50% was face-to-face time counseling regarding the above diagnoses

## 2016-01-24 NOTE — Assessment & Plan Note (Signed)
Three-month response to prior right L5-S1 interlaminar epidural.  Repeat epidural. A bit of hydrocodone given with the understanding that we will be downgrading to tramadol for future pain medication refills.

## 2016-02-14 ENCOUNTER — Encounter (HOSPITAL_COMMUNITY): Payer: Self-pay | Admitting: Psychiatry

## 2016-02-14 ENCOUNTER — Ambulatory Visit (INDEPENDENT_AMBULATORY_CARE_PROVIDER_SITE_OTHER): Payer: Self-pay | Admitting: Psychiatry

## 2016-02-14 VITALS — BP 110/66 | HR 67 | Ht 68.0 in | Wt 162.6 lb

## 2016-02-14 DIAGNOSIS — F331 Major depressive disorder, recurrent, moderate: Secondary | ICD-10-CM

## 2016-02-14 DIAGNOSIS — F4321 Adjustment disorder with depressed mood: Secondary | ICD-10-CM

## 2016-02-14 DIAGNOSIS — F0630 Mood disorder due to known physiological condition, unspecified: Secondary | ICD-10-CM

## 2016-02-14 DIAGNOSIS — F411 Generalized anxiety disorder: Secondary | ICD-10-CM

## 2016-02-14 NOTE — Progress Notes (Signed)
Patient ID: [name], female   DOB: October 25, 1956, 60 y.o.   MRN: [id number]   [facility] Follow-up Outpatient Visit  [name] May 26, 1956  Date:11/04/2015  Chief Complaint:  Follow Up. Depression and anxiety  History of Chief Complaint:   HPI Comments: [name] is a 60 y/o female with a past psychiatric history significant for Major Depression, Recurrent severe, generalized anxiety and bereavement. The patient returns  for psychiatric services for medication management.    . Location:  Her depression dates back to 2007-04-27 when she lost her mom and also her brother was using drugs.  She has been on Prozac 80 mg in the past BuSpar has been helpful. She is endorsing some depression down days procrastination and a motivation. She figures that may be medication needs to be adjusted but does believe that BuSpar was helpful but then she stopped it because she was doing better Anxiety: related to stress and anniversaries.  . Severity: Depression:5/10 (0=Very depressed; 5=Neutral; 10=Very Happy)  Anxiety- 3/10 (0=no anxiety; 5= moderate/tolerable anxiety; 10= panic attacks)  . Duration: The patient notes depression since her early 48's; and worsened since 04/27/2007 with the death of her parents.   . Timing: Mood is worse n the afternoon . Context: Being late. Being afraid she will "screw up at work."  . Modifying factors: Mood improves with exercise.  No psychotic symptoms     Review of Systems  Constitutional: Negative for fever.  Cardiovascular: Negative for chest pain.  Skin: Negative for rash.  Neurological: Negative for tingling and tremors.  Psychiatric/Behavioral: Positive for depression. Negative for suicidal ideas and substance abuse. The patient is not nervous/anxious.      Filed Vitals:   02/14/16 0948  BP: 110/66  Pulse: 67  Height:  (1.727 m)  Weight: 162 lb 9.6 oz (73.755 kg)  SpO2: 92%   Physical Exam  Vitals reviewed.  Constitutional:  She appears well-developed and well-nourished. No distress.  Skin: She is not diaphoretic.   Musculoskeletal: Strength & Muscle Tone: within normal limits Gait & Station: normal Patient leans: N/A   Past Medical History: Reviewed Past Medical History  Diagnosis Date  . Hypercholesterolemia   . Arthritis     Right Hip  . Hypothyroidism   . Chronic cystitis   . Dyspareunia   . Breast disorder      Rt Breast Calcifications  . Depression   . Anxiety   . Plantar fasciitis of left foot     2012/04/26  . Torn meniscus 11/13/15    Rt knee   Allergies: Reviewed Allergies  Allergen Reactions  . Bupropion Hcl     REACTION: irritabillity  . Sulfa Antibiotics Itching   Current Medications: Reviewed Current Outpatient Prescriptions on File Prior to Visit  Medication Sig Dispense Refill  . albuterol (PROAIR HFA) 108 (90 BASE) MCG/ACT inhaler Inhale 2 puffs into the lungs every 6 (six) hours as needed for wheezing or shortness of breath. Before exercise. 1 Inhaler 0  . calcium carbonate (OS-CAL) 600 MG TABS tablet Take 600 mg by mouth 2 (two) times daily with a meal.     . calcium-vitamin D (OSCAL WITH D) 500-200 MG-UNIT per tablet Take 2 tablets by mouth daily.     . clonazePAM (KLONOPIN) 0.5 MG tablet TAKE 1 TABLET BY MOUTH DAILY AS NEEDED 90 tablet 0  . FLUoxetine (PROZAC) 40 MG capsule Take 2 a day. Total of 80 mg 135 capsule 0  . HYDROcodone-acetaminophen (NORCO) 10-325 MG tablet Take  1 tablet by mouth 2 (two) times daily as needed. 20 tablet 0  . levothyroxine (SYNTHROID, LEVOTHROID) 88 MCG tablet TAKE 1 TABLET (88 MCG TOTAL) BY MOUTH DAILY BEFORE BREAKFAST. 90 tablet 0  . Multiple Vitamin (MULTIVITAMIN) tablet Take 1 tablet by mouth daily.      . nitrofurantoin (MACRODANTIN) 50 MG capsule Take 50 mg by mouth daily as needed.    . valACYclovir (VALTREX) 500 MG tablet Take 500 mg by mouth daily as needed.     No current facility-administered medications on file prior to visit.    Previous Psychotropic Medications: Reviewed Medication   Prozac   Clonazapam   Buspirone   Alprazolam.    Substance Abuse History in the last 12 months: Reviewed Social History  Substance Use Topics  . Smoking status: Former Smoker    Quit date: 09/20/1982  . Smokeless tobacco: Never Used  . Alcohol Use: 0.0 oz/week    0 Standard drinks or equivalent per week     Comment: Use is 1-2 a month.  Caffeine: 2 cups a day   Family History: Reviewed Family History   Problem  Relation  Age of Onset   .  COPD     .  Aneurysm     .  Anxiety disorder  Mother    .  Depression  Mother    .  Alcohol abuse  Father    .  Heart murmur  Father    .  Drug abuse  Brother    .  Anxiety disorder  Brother    .  Depression  Brother    .  Dementia  Maternal Grandfather     Psychiatric Specialty Exam:  Objective: Appearance: Casual and Well Groomed   Eye Contact:: Good   Speech: Clear and Coherent and Normal Rate   Volume: Normal   Mood:  Somewhat down    Affect: Congruent   Thought Process: Coherent, Logical and Loose   Orientation: Full   Thought Content: WDL   Suicidal Thoughts: No   Homicidal Thoughts: No   Judgement: Fair   Insight: Fair   Psychomotor Activity: Normal   Akathisia: No   Handed: Left   Memory: Immediate 3/3, recent: 1/3   . 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 Generalized anxiety disorder. bereavement  AXIS II  No diagnosis   AXIS III  Past Medical History    Diagnosis  Date    .  Hypercholesterolemia     .  Arthritis       Right Hip    .  Hypothyroidism     .  Chronic cystitis     .  Decreased libido     .  Dyspareunia     .  Breast disorder       Rt Breast  Calcifications    .  Depression     .  Anxiety     .  Plantar fasciitis of left foot       2013      AXIS IV  other psychosocial or environmental problems   AXIS V  65   Treatment Plan/Recommendations:   Plan of Care:  PLAN:  1. Affirm with the patient that the medications are taken as  ordered. Patient  expressed understanding of how their medications were to be used.    Laboratory:    Psychotherapy: Therapy: brief supportive therapy provided.  Discussed psychosocial stressors in detail. More than 50% of the visit was spent on individual therapy/counseling.   Medications:  Continue  the following psychiatric medications as written prior to this appointment with the following changes:: continue following for depression, anxiety.  A)prozac continue 80 mg qd. Has refills b) re start buspirone has meds.  bid c) Asked patient to limit and try to discontinue clonazepam. She is seldom using it.  Bereavement ; baseline. Worse during anniversaries  -Risks and benefits, side effects and alternatives discussed with patient, she was given an opportunity to ask questions about her medication, illness, and treatment. All current psychiatric medications have been reviewed and discussed with the patient and adjusted as clinically appropriate. The patient has been provided an accurate and updated list of the medications being now prescribed.   Routine PRN Medications:  Negative  Consultations: follow with primary care  Safety Concerns:   Patient told to call clinic if any problems occur. Patient advised to go to  ER  if she should develop SI/HI, side effects, or if symptoms worsen. Has crisis numbers to call if needed.    Other:   8. Patient was instructed to return to clinic in 1 month. Will assess if buspirone helps or will consider different augmentation.   Time spent: 25 minutes.     Thresa Ross, MD  02/14/2016 10:03 AM

## 2016-02-28 ENCOUNTER — Ambulatory Visit
Admission: RE | Admit: 2016-02-28 | Discharge: 2016-02-28 | Disposition: A | Discharge status: Home / Self Care | Payer: 59 | Source: Ambulatory Visit | Attending: Sports Medicine | Admitting: Sports Medicine

## 2016-02-28 MED ORDER — IOHEXOL 180 MG/ML  SOLN
1.0000 mL | Freq: Once | INTRAMUSCULAR | Status: AC | PRN
  Administered 2016-02-28: 1 mL via EPIDURAL

## 2016-02-28 MED ORDER — METHYLPREDNISOLONE ACETATE 40 MG/ML INJ SUSP (RADIOLOG
120.0000 mg | Freq: Once | INTRAMUSCULAR | Status: AC
  Administered 2016-02-28: 120 mg via EPIDURAL

## 2016-02-28 NOTE — Discharge Instructions (Signed)

## 2016-03-02 ENCOUNTER — Ambulatory Visit (HOSPITAL_COMMUNITY): Payer: Self-pay | Admitting: Psychiatry

## 2016-03-06 ENCOUNTER — Encounter (HOSPITAL_COMMUNITY): Payer: Self-pay | Admitting: Psychiatry

## 2016-03-06 ENCOUNTER — Ambulatory Visit (INDEPENDENT_AMBULATORY_CARE_PROVIDER_SITE_OTHER): Payer: 59 | Admitting: Psychiatry

## 2016-03-06 VITALS — BP 118/70 | HR 63 | Ht 68.0 in | Wt 158.0 lb

## 2016-03-06 DIAGNOSIS — F419 Anxiety disorder, unspecified: Secondary | ICD-10-CM

## 2016-03-06 DIAGNOSIS — F411 Generalized anxiety disorder: Secondary | ICD-10-CM

## 2016-03-06 DIAGNOSIS — F331 Major depressive disorder, recurrent, moderate: Secondary | ICD-10-CM

## 2016-03-06 DIAGNOSIS — F0630 Mood disorder due to known physiological condition, unspecified: Secondary | ICD-10-CM

## 2016-03-06 DIAGNOSIS — F4321 Adjustment disorder with depressed mood: Secondary | ICD-10-CM

## 2016-03-06 MED ORDER — LAMOTRIGINE 25 MG PO TABS: 25.0000 mg | ORAL_TABLET | Freq: Every day | ORAL | Status: DC

## 2016-03-06 MED ORDER — FLUOXETINE HCL 40 MG PO CAPS: ORAL_CAPSULE | ORAL | Status: DC

## 2016-03-06 NOTE — Progress Notes (Signed)
Patient ID: [name], female   DOB: 04-02-1956, 60 y.o.   MRN: [id number]   [facility] Follow-up Outpatient Visit  [name] 1956/11/26  Date: 03/06/2016  Chief Complaint:  Follow Up. Depression and anxiety  History of Chief Complaint:   HPI Comments: [name] is a 60 y/o female with a past psychiatric history significant for Major Depression, Recurrent severe, generalized anxiety and bereavement. The patient returns  for psychiatric services and medication management.  Her depression dates back to 02-May-2007 when she lost her mom and also her brother was using drugs.   She continues to take Prozac 80 mg and BuSpar 3 times a day but apparently for the last 1 month she's been feeling down. Says there is no clear reason of her depression. Overall she feels decreased motivation and feeling of sadness  Anxiety: related to stress and anniversaries.  . Severity: Depression: has worsened : 4/10 (0=Very depressed; 5=Neutral; 10=Very Happy)  Anxiety- 4/10 (0=no anxiety; 5= moderate/tolerable anxiety; 10= panic attacks)  . Duration: The patient notes depression since her early 41's; and worsened since 05-02-07 with the death of her parents.   . Timing: Mood is worse n the afternoon . Context: Being late and variable factors related to anniversaries and stress times.  . Modifying factors: Mood improves with exercise. No psychotic symptoms or mania  Review of Systems  Constitutional: Negative for fever.  Cardiovascular: Negative for palpitations.  Skin: Negative for rash.  Psychiatric/Behavioral: Positive for depression. Negative for suicidal ideas and substance abuse. The patient is not nervous/anxious.      Filed Vitals:   03/06/16 1452  BP: 118/70  Pulse: 63  Height:  (1.727 m)  Weight: 158 lb (71.668 kg)  SpO2: 96%   Physical Exam  Vitals reviewed.  Constitutional: She appears well-developed and well-nourished. No distress.  Skin: She is not diaphoretic.    Musculoskeletal: Strength & Muscle Tone: within normal limits Gait & Station: normal Patient leans: N/A   Past Medical History: Reviewed Past Medical History  Diagnosis Date  . Hypercholesterolemia   . Arthritis     Right Hip  . Hypothyroidism   . Chronic cystitis   . Dyspareunia   . Breast disorder      Rt Breast Calcifications  . Depression   . Anxiety   . Plantar fasciitis of left foot     05/01/12  . Torn meniscus 11/13/15    Rt knee   Allergies: Reviewed Allergies  Allergen Reactions  . Bupropion Hcl Other (See Comments)    irritabillity  . Sulfa Antibiotics Itching   Current Medications: Reviewed Current Outpatient Prescriptions on File Prior to Visit  Medication Sig Dispense Refill  . albuterol (PROAIR HFA) 108 (90 BASE) MCG/ACT inhaler Inhale 2 puffs into the lungs every 6 (six) hours as needed for wheezing or shortness of breath. Before exercise. 1 Inhaler 0  . calcium carbonate (OS-CAL) 600 MG TABS tablet Take 600 mg by mouth 2 (two) times daily with a meal.     . calcium-vitamin D (OSCAL WITH D) 500-200 MG-UNIT per tablet Take 2 tablets by mouth daily.     . clonazePAM (KLONOPIN) 0.5 MG tablet TAKE 1 TABLET BY MOUTH DAILY AS NEEDED 90 tablet 0  . HYDROcodone-acetaminophen (NORCO) 10-325 MG tablet Take 1 tablet by mouth 2 (two) times daily as needed. 20 tablet 0  . levothyroxine (SYNTHROID, LEVOTHROID) 88 MCG tablet TAKE 1 TABLET (88 MCG TOTAL) BY MOUTH DAILY BEFORE BREAKFAST. 90 tablet 0  .  Multiple Vitamin (MULTIVITAMIN) tablet Take 1 tablet by mouth daily.      . nitrofurantoin (MACRODANTIN) 50 MG capsule Take 50 mg by mouth daily as needed.    . valACYclovir (VALTREX) 500 MG tablet Take 500 mg by mouth daily as needed.     No current facility-administered medications on file prior to visit.     Substance Abuse History in the last 12 months: Reviewed Social History  Substance Use Topics  . Smoking status: Former Smoker    Quit date: 09/20/1982  .  Smokeless tobacco: Never Used  . Alcohol Use: 0.0 oz/week    0 Standard drinks or equivalent per week     Comment: Use is 1-2 a month.  Caffeine: 2 cups a day   Family History: Reviewed Family History   Problem  Relation  Age of Onset   .  COPD     .  Aneurysm     .  Anxiety disorder  Mother    .  Depression  Mother    .  Alcohol abuse  Father    .  Heart murmur  Father    .  Drug abuse  Brother    .  Anxiety disorder  Brother    .  Depression  Brother    .  Dementia  Maternal Grandfather     Psychiatric Specialty Exam:  Objective: Appearance: Casual and Well Groomed   Eye Contact:: Good   Speech: Clear and Coherent and Normal Rate   Volume: Normal   Mood:  dysthymic   Affect: Congruent   Thought Process: Coherent, Logical and Loose   Orientation: Full   Thought Content: WDL   Suicidal Thoughts: No   Homicidal Thoughts: No   Judgement: Fair   Insight: Fair   Psychomotor Activity: Normal   Akathisia: No   Handed: Left   Memory: Immediate 3/3, recent: 1/3   . Assets: Communication Skills  Desire for Improvement  Financial Resources/Insurance  Housing  Leisure Time  Physical Health  Resilience  Transportation  Vocational/Educational    Laboratory/X-Ray  Psychological Evaluation(s)   NONE  NONE    Assessment:  AXIS I  Major Depression, Recurrent severe- (worsening). Generalized anxiety disorder. bereavement  AXIS II  No diagnosis   AXIS III  Past Medical History    Diagnosis  Date    .  Hypercholesterolemia     .  Arthritis       Right Hip    .  Hypothyroidism     .  Chronic cystitis     .  Decreased libido     .  Dyspareunia     .  Breast disorder       Rt Breast Calcifications    .  Depression     .  Anxiety     .  Plantar fasciitis of left foot       2013      AXIS IV  other psychosocial or environmental problems   AXIS V  64   Treatment Plan/Recommendations:   Plan of Care:  PLAN:  1. Affirm with the patient that the medications are taken  as ordered. Patient  expressed understanding of how their medications were to be used.    Laboratory:    Psychotherapy: Therapy: brief supportive therapy provided.  Discussed psychosocial stressors in detail. More than 50% of the visit was spent on individual therapy/counseling.   Medications:  Continue  the following psychiatric medications as written prior to this appointment  with the following changes:: continue following for depression, anxiety.  A)prozac continue 80 mg qd. Refill sent for 90 days. Augment with lamictal for mood stability. Start 25mg  and increase to 50mg . Discussed side effects including rash.   b) continue buspar . Can call for refill  c) Asked patient to limit and try to discontinue clonazepam. She uses it seldom.  Bereavement ; baseline. Worse during anniversaries      Consultations: follow with primary care  Safety Concerns:   Patient told to call clinic if any problems occur. Patient advised to go to  ER  if she should develop SI/HI, side effects, or if symptoms worsen. Has crisis numbers to call if needed.       Time spent: 25 minutes.     Thresa RossNadeem Faust [place], MD  03/06/2016 3:46 PM

## 2016-03-10 ENCOUNTER — Encounter: Payer: Self-pay | Admitting: Sports Medicine

## 2016-03-10 ENCOUNTER — Ambulatory Visit (INDEPENDENT_AMBULATORY_CARE_PROVIDER_SITE_OTHER): Payer: 59 | Admitting: Sports Medicine

## 2016-03-10 DIAGNOSIS — M4726 Other spondylosis with radiculopathy, lumbar region: Secondary | ICD-10-CM | POA: Diagnosis not present

## 2016-03-10 MED ORDER — TRAMADOL HCL 50 MG PO TABS: ORAL_TABLET | ORAL | Status: DC

## 2016-03-10 NOTE — Progress Notes (Signed)
  Subjective:    CC: Follow-up  HPI: Low back pain: Has not yet had a response to her third L5-S1 epidural, she has multiple disc protrusions at higher levels and is post L1-L2 fusion.  She was doing well with hydrocodone but understands that we will not be doing this long-term and is very agreeable to downgrade to tramadol for long-term use.  Past medical history, Surgical history, Family history not pertinant except as noted below, Social history, Allergies, and medications have been entered into the medical record, reviewed, and no changes needed.   Review of Systems: No fevers, chills, night sweats, weight loss, chest pain, or shortness of breath.   Objective:    General: Well Developed, well nourished, and in no acute distress.  Neuro: Alert and oriented x3, extra-ocular muscles intact, sensation grossly intact.  HEENT: Normocephalic, atraumatic, pupils equal round reactive to light, neck supple, no masses, no lymphadenopathy, thyroid nonpalpable.  Skin: Warm and dry, no rashes. Cardiac: Regular rate and rhythm, no murmurs rubs or gallops, no lower extremity edema.  Respiratory: Clear to auscultation bilaterally. Not using accessory muscles, speaking in full sentences.  Impression and Recommendations:   I spent 25 minutes with this patient, greater than 50% was face-to-face time counseling regarding the above diagnoses

## 2016-03-10 NOTE — Assessment & Plan Note (Signed)
Multilevel disc protrusions, third epidural at the L5-S1 level has not yet improved pain. She does have another protrusion at L2-L3 that conserves the future interventional target but it's too early at this point. We are going to switch to tramadol for long-term use, up to 3 pills daily.

## 2016-03-25 ENCOUNTER — Telehealth: Payer: Self-pay

## 2016-03-25 NOTE — Telephone Encounter (Signed)
Pt states you gave her tramadol for pain and it has been helping after her epidural. Is scheduled to go out of town and would like to know if the pain comes back what kind of treatment would be next.

## 2016-03-26 NOTE — Telephone Encounter (Signed)
Good question, typically in additional epidural, the usual sequence is 3 epidurals in a six-month period before considering surgery

## 2016-03-26 NOTE — Telephone Encounter (Signed)
Pt notified of information

## 2016-04-02 ENCOUNTER — Other Ambulatory Visit (HOSPITAL_COMMUNITY): Payer: Self-pay | Admitting: Psychiatry

## 2016-04-02 NOTE — Telephone Encounter (Signed)
Received medication request from [facility] for Lamictal. Per Dr. Gilmore LarocheAkhtar, medication is denied. Pt will need to schedule an appt. LVM for pt to return telephone call to clinic to schedule appt.

## 2016-04-06 ENCOUNTER — Ambulatory Visit (HOSPITAL_COMMUNITY): Payer: Self-pay | Admitting: Psychiatry

## 2016-04-20 ENCOUNTER — Ambulatory Visit (INDEPENDENT_AMBULATORY_CARE_PROVIDER_SITE_OTHER): Payer: 59 | Admitting: Sports Medicine

## 2016-04-20 ENCOUNTER — Encounter: Payer: Self-pay | Admitting: Sports Medicine

## 2016-04-20 DIAGNOSIS — F332 Major depressive disorder, recurrent severe without psychotic features: Secondary | ICD-10-CM

## 2016-04-20 DIAGNOSIS — M4726 Other spondylosis with radiculopathy, lumbar region: Secondary | ICD-10-CM

## 2016-04-20 MED ORDER — DULOXETINE HCL 30 MG PO CPEP: 30.0000 mg | ORAL_CAPSULE | Freq: Every day | ORAL | Status: DC

## 2016-04-20 MED ORDER — HYDROCODONE-ACETAMINOPHEN 5-325 MG PO TABS: 1.0000 | ORAL_TABLET | Freq: Three times a day (TID) | ORAL | Status: DC | PRN

## 2016-04-20 NOTE — Assessment & Plan Note (Signed)
She also has uncontrolled depression, I will be switching from Prozac to Cymbalta. We will not be fully control her back pain until her depression is controlled as well.

## 2016-04-20 NOTE — Patient Instructions (Signed)
Start Cymbalta tomorrow, do Prozac every other day for one week then stop.

## 2016-04-20 NOTE — Progress Notes (Signed)
  Subjective:    CC: Follow-up  HPI: Lumbar spondylosis: Minimal improvement with an L5-S1 epidural, she also had an L2-L3 disc protrusion that we have not yet targeted. Symptoms remain moderate, persistent. Back pain is discogenic.  Depression: Uncontrolled, tearful in the exam room.  Past medical history, Surgical history, Family history not pertinant except as noted below, Social history, Allergies, and medications have been entered into the medical record, reviewed, and no changes needed.   Review of Systems: No fevers, chills, night sweats, weight loss, chest pain, or shortness of breath.   Objective:    General: Well Developed, well nourished, and in no acute distress.  Neuro: Alert and oriented x3, extra-ocular muscles intact, sensation grossly intact.  HEENT: Normocephalic, atraumatic, pupils equal round reactive to light, neck supple, no masses, no lymphadenopathy, thyroid nonpalpable.  Skin: Warm and dry, no rashes. Cardiac: Regular rate and rhythm, no murmurs rubs or gallops, no lower extremity edema.  Respiratory: Clear to auscultation bilaterally. Not using accessory muscles, speaking in full sentences.  Impression and Recommendations:    I spent 25 minutes with this patient, greater than 50% was face-to-face time counseling regarding the above diagnoses

## 2016-04-20 NOTE — Assessment & Plan Note (Signed)
Minimal response to L5-S1 epidural, she also has a disc protrusion at the L2-L3 level, we are going to proceed with an L2-L3 interlaminar epidural, tramadol as ineffective, adding a few days of hydrocodone which she understands be temporary. She also has uncontrolled depression, I will be switching from Prozac to Cymbalta. We will not be fully control her back pain until her depression is controlled as well.

## 2016-04-27 ENCOUNTER — Other Ambulatory Visit: Payer: Self-pay | Admitting: Family Medicine

## 2016-04-27 ENCOUNTER — Telehealth: Payer: Self-pay

## 2016-04-27 DIAGNOSIS — M4726 Other spondylosis with radiculopathy, lumbar region: Secondary | ICD-10-CM

## 2016-04-27 NOTE — Telephone Encounter (Signed)
Referral placed to pain management, no narcotics as discussed

## 2016-04-27 NOTE — Telephone Encounter (Signed)
Pt would like to know if you will give her hydrocodone 10mg  to last until her epidural on May 15th and would also like to know if she can have a referral to Pain management. Please advise.

## 2016-04-28 NOTE — Telephone Encounter (Signed)
Left detailed message with information and to call back with any questions or concerns. 

## 2016-05-04 ENCOUNTER — Ambulatory Visit
Admission: RE | Admit: 2016-05-04 | Discharge: 2016-05-04 | Disposition: A | Discharge status: Home / Self Care | Payer: 59 | Source: Ambulatory Visit | Attending: Sports Medicine | Admitting: Sports Medicine

## 2016-05-04 MED ORDER — METHYLPREDNISOLONE ACETATE 40 MG/ML INJ SUSP (RADIOLOG
120.0000 mg | Freq: Once | INTRAMUSCULAR | Status: AC
  Administered 2016-05-04: 120 mg via EPIDURAL

## 2016-05-04 MED ORDER — IOPAMIDOL (ISOVUE-M 200) INJECTION 41%
1.0000 mL | Freq: Once | INTRAMUSCULAR | Status: AC
  Administered 2016-05-04: 1 mL via EPIDURAL

## 2016-05-11 ENCOUNTER — Ambulatory Visit (INDEPENDENT_AMBULATORY_CARE_PROVIDER_SITE_OTHER): Payer: 59 | Admitting: Family Medicine

## 2016-05-11 ENCOUNTER — Ambulatory Visit (INDEPENDENT_AMBULATORY_CARE_PROVIDER_SITE_OTHER): Payer: 59 | Admitting: Psychiatry

## 2016-05-11 ENCOUNTER — Encounter (HOSPITAL_COMMUNITY): Payer: Self-pay | Admitting: Psychiatry

## 2016-05-11 ENCOUNTER — Encounter: Payer: Self-pay | Admitting: Family Medicine

## 2016-05-11 VITALS — BP 118/68 | HR 89 | Ht 68.0 in | Wt 161.2 lb

## 2016-05-11 VITALS — BP 131/77 | HR 98 | Wt 161.0 lb

## 2016-05-11 DIAGNOSIS — F411 Generalized anxiety disorder: Secondary | ICD-10-CM

## 2016-05-11 DIAGNOSIS — E038 Other specified hypothyroidism: Secondary | ICD-10-CM

## 2016-05-11 DIAGNOSIS — F331 Major depressive disorder, recurrent, moderate: Secondary | ICD-10-CM | POA: Diagnosis not present

## 2016-05-11 DIAGNOSIS — F4321 Adjustment disorder with depressed mood: Secondary | ICD-10-CM | POA: Diagnosis not present

## 2016-05-11 DIAGNOSIS — M4726 Other spondylosis with radiculopathy, lumbar region: Secondary | ICD-10-CM | POA: Diagnosis not present

## 2016-05-11 DIAGNOSIS — R195 Other fecal abnormalities: Secondary | ICD-10-CM

## 2016-05-11 DIAGNOSIS — E785 Hyperlipidemia, unspecified: Secondary | ICD-10-CM

## 2016-05-11 DIAGNOSIS — F0630 Mood disorder due to known physiological condition, unspecified: Secondary | ICD-10-CM | POA: Diagnosis not present

## 2016-05-11 MED ORDER — LAMOTRIGINE 25 MG PO TABS: 75.0000 mg | ORAL_TABLET | Freq: Every day | ORAL | Status: DC

## 2016-05-11 NOTE — Progress Notes (Signed)
Subjective:    CC: thyroid  HPI:  Follow-up hypothyroidism-she's currently on Levothroid and 88 g daily. Happy with current regimen. No recent skin or hair changes. No significant weight changes.  Diarrhea- overall she does feel like her diarrhea is better. It's been going on for about a year. She changed her diet really cut fruit out and switch to Lactaid milk and says it has been much better but still occasionally bothersome. She never went to have the stool samples done. She said she really just couldn't get the timing right to bring [place] a sample. She is following with Dr. Benjamin Stain for core back. He is writing her oxycodone recently referred her to pain management. She has an appointment on June 5. She also recently had a hip x-ray on the right side of wants to know the results. Next   Past medical history, Surgical history, Family history not pertinant except as noted below, Social history, Allergies, and medications have been entered into the medical record, reviewed, and corrections made.   Review of Systems: No fevers, chills, night sweats, weight loss, chest pain, or shortness of breath.   BP 131/77 mmHg  Pulse 98  Wt 161 lb (73.029 kg)  SpO2 99%    Allergies  Allergen Reactions  . Bupropion Hcl Other (See Comments)    irritabillity  . Sulfa Antibiotics Itching    Past Medical History  Diagnosis Date  . Hypercholesterolemia   . Arthritis     Right Hip  . Hypothyroidism   . Chronic cystitis   . Dyspareunia   . Breast disorder      Rt Breast Calcifications  . Depression   . Anxiety   . Plantar fasciitis of left foot     2013  . Torn meniscus 11/13/15    Rt knee    Past Surgical History  Procedure Laterality Date  . Scoliosis repair with harrington rods    . Breast enhancement surgery    . Plastic surgry to eyes    . Knee surgery Right 11/16    Social History   Social History  . Marital Status: Married    Spouse Name: N/A  . Number of Children: N/A   . Years of Education: N/A   Occupational History  . CNA    Social History Main Topics  . Smoking status: Former Smoker    Quit date: 09/20/1982  . Smokeless tobacco: Never Used  . Alcohol Use: 0.0 oz/week    0 Standard drinks or equivalent per week     Comment: Use is 1-2 a month.  . Drug Use: No  . Sexual Activity: No   Other Topics Concern  . 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      Family History  Problem Relation Age of Onset  . COPD Father     smoker  . Aneurysm    . Anxiety disorder Mother   . Depression Mother   . Alcohol abuse Father   . Heart murmur Father   . Drug abuse Brother   . Anxiety disorder Brother   . Depression Brother   . Dementia Maternal Grandfather     Outpatient Encounter Prescriptions as of 05/11/2016  Medication Sig  . FLUoxetine (PROZAC) 40 MG capsule   . albuterol (PROAIR HFA) 108 (90 BASE) MCG/ACT inhaler Inhale 2 puffs into the lungs every 6 (six) hours as needed for wheezing or shortness of breath. Before exercise.  Marland Kitchen  busPIRone (BUSPAR) 15 MG tablet Take 15 mg by mouth 3 (three) times daily.  . calcium-vitamin D (OSCAL WITH D) 500-200 MG-UNIT per tablet Take 2 tablets by mouth daily.   . clonazePAM (KLONOPIN) 0.5 MG tablet TAKE 1 TABLET BY MOUTH DAILY AS NEEDED  . DULoxetine (CYMBALTA) 30 MG capsule Take 1 capsule (30 mg total) by mouth daily.  Marland Kitchen. HYDROcodone-acetaminophen (NORCO/VICODIN) 5-325 MG tablet Take 1 tablet by mouth every 8 (eight) hours as needed for moderate pain.  Marland Kitchen. lamoTRIgine (LAMICTAL) 25 MG tablet Take 1 tablet (25 mg total) by mouth daily. Take one tablet daily for a week and then start taking 2 tablets.  Marland Kitchen. levothyroxine (SYNTHROID, LEVOTHROID) 88 MCG tablet TAKE 1 TABLET (88 MCG TOTAL) BY MOUTH DAILY BEFORE BREAKFAST. DUE FOR LAB WORK.  . Multiple Vitamin (MULTIVITAMIN) tablet Take 1 tablet by mouth daily.    . nitrofurantoin (MACRODANTIN) 50 MG capsule Take 50 mg by  mouth daily as needed.  . valACYclovir (VALTREX) 500 MG tablet Take 500 mg by mouth daily as needed.  . [DISCONTINUED] traMADol (ULTRAM) 50 MG tablet 1 tabs by mouth Q8 hours, maximum 6 tabs per day.   No facility-administered encounter medications on file as of 05/11/2016.       Objective:    General: Well Developed, well nourished, and in no acute distress.  Neuro: Alert and oriented x3, extra-ocular muscles intact, sensation grossly intact.  HEENT: Normocephalic, atraumatic  Skin: Warm and dry, no rashes. Cardiac: Regular rate and rhythm, no murmurs rubs or gallops, no lower extremity edema.  Respiratory: Clear to auscultation bilaterally. Not using accessory muscles, speaking in full sentences.   Impression and Recommendations:    Hypothyroid-due to recheck TSH and refill medication. TSH was off in April of last year but repeat imaging was normal.  Diarrhea/loose stoools- Not resolve, but much improved.  OA of the spine with radiculopathy-following with Dr. Benjamin Stainhekkekandam. She's been getting multiple epidurals. She says sometimes they'll last for a few days and at the longest 6 weeks. She's been referred to pain management.She is also on Cymbalta.  Hyperlipidemia-due to recheck lipids.

## 2016-05-11 NOTE — Progress Notes (Signed)
Patient ID: [name], female   DOB: 10/16/1956, 60 y.o.   MRN: [id number]   [facility]  [name] 10/16/1956  Date: 05/11/2016  Chief Complaint:  Follow Up. Depression and anxiety  History of Chief Complaint:   HPI Comments: [name] is a 60 y/o female with a past psychiatric history significant for Major Depression, Recurrent severe, generalized anxiety and bereavement. The patient returns  for psychiatric services and medication management.  Her depression dates back to 2008 when she lost her mom and also her brother was using drugs.   She is getting epidural injections for her shoulder pain. Her providers stopped her Prozac and started on Cymbalta. For the last couple of weeks she's been feeling a little bit anxious in the morning. She takes one quarter of Klonopin sometimes that helps more than that she starts feeling sleepy . She is increase the dose of Cymbalta to 2 times a day. Still feels somewhat anxious around the early part of morning although she does like her job. She was also started on lamictal last visit now 50mg  . Some improvement in mood . Anxiety: related to stress and anniversaries.  . Severity: Depression: has not worsened : 6/10 (0=Very depressed; 5=Neutral; 10=Very Happy)  Anxiety- 4/10 (0=no anxiety; 5= moderate/tolerable anxiety; 10= panic attacks) (somewhat worsened)  . Duration: The patient notes depression since her early 5230's; and worsened since 2008 with the death of her parents.   . Timing: Mood is somewhat more anxious early morning . Context: Being late and variable factors related to anniversaries and stress times.  . Modifying factors: Mood improves with exercise. No psychotic symptoms or mania  Review of Systems  Constitutional: Negative for fever.  Cardiovascular: Negative for palpitations.  Skin: Negative for rash.  Psychiatric/Behavioral: Negative for suicidal ideas and substance abuse. The  patient is nervous/anxious.      Filed Vitals:   05/11/16 1301  BP: 118/68  Pulse: 89  Height: 5\' 8"  (1.727 m)  Weight: 161 lb 3.2 oz (73.12 kg)  SpO2: 97%   Physical Exam  Vitals reviewed.  Constitutional: She appears well-developed and well-nourished. No distress.  Skin: She is not diaphoretic.   Musculoskeletal: Strength & Muscle Tone: within normal limits Gait & Station: normal Patient leans: N/A   Past Medical History: Reviewed Past Medical History  Diagnosis Date  . Hypercholesterolemia   . Arthritis     Right Hip  . Hypothyroidism   . Chronic cystitis   . Dyspareunia   . Breast disorder      Rt Breast Calcifications  . Depression   . Anxiety   . Plantar fasciitis of left foot     2013  . Torn meniscus 11/13/15    Rt knee   Allergies: Reviewed Allergies  Allergen Reactions  . Bupropion Hcl Other (See Comments)    irritabillity  . Sulfa Antibiotics Itching   Current Medications: Reviewed Current Outpatient Prescriptions on File Prior to Visit  Medication Sig Dispense Refill  . albuterol (PROAIR HFA) 108 (90 BASE) MCG/ACT inhaler Inhale 2 puffs into the lungs every 6 (six) hours as needed for wheezing or shortness of breath. Before exercise. 1 Inhaler 0  . busPIRone (BUSPAR) 15 MG tablet Take 15 mg by mouth 3 (three) times daily.    . calcium-vitamin D (OSCAL WITH D) 500-200 MG-UNIT per tablet Take 2 tablets by mouth daily.     . clonazePAM (KLONOPIN) 0.5 MG tablet TAKE 1 TABLET BY MOUTH DAILY AS  NEEDED 90 tablet 0  . DULoxetine (CYMBALTA) 30 MG capsule Take 1 capsule (30 mg total) by mouth daily. 30 capsule 3  . HYDROcodone-acetaminophen (NORCO/VICODIN) 5-325 MG tablet Take 1 tablet by mouth every 8 (eight) hours as needed for moderate pain. (Patient taking differently: Take 2 tablets by mouth 2 (two) times daily as needed for moderate pain. ) 10 tablet 0  . lamoTRIgine (LAMICTAL) 25 MG tablet Take 1 tablet (25 mg total) by mouth daily. Take one tablet  daily for a week and then start taking 2 tablets. 60 tablet 0  . levothyroxine (SYNTHROID, LEVOTHROID) 88 MCG tablet TAKE 1 TABLET (88 MCG TOTAL) BY MOUTH DAILY BEFORE BREAKFAST. DUE FOR LAB WORK. 90 tablet 0  . Multiple Vitamin (MULTIVITAMIN) tablet Take 1 tablet by mouth daily.      . nitrofurantoin (MACRODANTIN) 50 MG capsule Take 50 mg by mouth daily as needed.    . valACYclovir (VALTREX) 500 MG tablet Take 500 mg by mouth daily as needed.     No current facility-administered medications on file prior to visit.     Substance Abuse History in the last 12 months: Reviewed Social History  Substance Use Topics  . Smoking status: Former Smoker    Quit date: 09/20/1982  . Smokeless tobacco: Never Used  . Alcohol Use: 0.0 oz/week    0 Standard drinks or equivalent per week     Comment: Use is 1-2 a month.  Caffeine: 2 cups a day   Family History: Reviewed Family History   Problem  Relation  Age of Onset   .  COPD     .  Aneurysm     .  Anxiety disorder  Mother    .  Depression  Mother    .  Alcohol abuse  Father    .  Heart murmur  Father    .  Drug abuse  Brother    .  Anxiety disorder  Brother    .  Depression  Brother    .  Dementia  Maternal Grandfather     Psychiatric Specialty Exam:  Objective: Appearance: Casual and Well Groomed   Eye Contact:: Good   Speech: Clear and Coherent and Normal Rate   Volume: Normal   Mood:  Somewhat anxious in the morning. No hopeless   Affect: Congruent   Thought Process: Coherent, Logical and Loose   Orientation: Full   Thought Content: WDL   Suicidal Thoughts: No   Homicidal Thoughts: No   Judgement: Fair   Insight: Fair   Psychomotor Activity: Normal   Akathisia: No   Handed: Left   Memory: Immediate 3/3, recent: 1/3   . Assets: Communication Skills  Desire for Improvement  Financial Resources/Insurance  Housing  Leisure Time  Physical Health  Resilience  Transportation  Vocational/Educational    Laboratory/X-Ray   Psychological Evaluation(s)   NONE  NONE    Assessment:  AXIS I  Major Depression, Recurrent severe- (worsening). Generalized anxiety disorder. bereavement  AXIS II  No diagnosis   AXIS III  Past Medical History    Diagnosis  Date    .  Hypercholesterolemia     .  Arthritis       Right Hip    .  Hypothyroidism     .  Chronic cystitis     .  Decreased libido     .  Dyspareunia     .  Breast disorder       Rt Breast Calcifications    .  Depression     .  Anxiety     .  Plantar fasciitis of left foot       2013      AXIS IV  other psychosocial or environmental problems   AXIS V  64   Treatment Plan/Recommendations:   Plan of Care:  PLAN:  1. Affirm with the patient that the medications are taken as ordered. Patient  expressed understanding of how their medications were to be used.    Laboratory:    Psychotherapy: Therapy: brief supportive therapy provided.  Discussed psychosocial stressors in detail. More than 50% of the visit was spent on individual therapy/counseling.   Medications:  Continue  the following psychiatric medications as written prior to this appointment with the following changes:: continue following for depression, anxiety.  A) she has stopped prozac. Can continue cymbalta  bid.  Augment continue with lamictal for mood stability. Increase to  qhs. Discussed side effects including rash.   b) continue buspar . Can call for refill  c) Asked patient to limit and try to discontinue clonazepam. She uses it seldom. She can take quarter to half at night for anxiety and sleep.  Bereavement ; baseline. Worse during anniversaries      Consultations: follow with primary care  Safety Concerns:   Patient told to call clinic if any problems occur. Patient advised to go to  ER  if she should develop SI/HI, side effects, or if symptoms worsen. Has crisis numbers to call if needed.       Time spent: 25 minutes.     Thresa Ross, MD  05/11/2016 1:30 PM

## 2016-05-12 LAB — COMPLETE METABOLIC PANEL WITH GFR
ALT: 11 U/L (ref 6–29)
AST: 17 U/L (ref 10–35)
Albumin: 4.5 g/dL (ref 3.6–5.1)
Alkaline Phosphatase: 52 U/L (ref 33–130)
BUN: 18 mg/dL (ref 7–25)
CO2: 28 mmol/L (ref 20–31)
Calcium: 9.8 mg/dL (ref 8.6–10.4)
Chloride: 95 mmol/L — ABNORMAL LOW (ref 98–110)
Creat: 0.83 mg/dL (ref 0.50–0.99)
GFR, EST NON AFRICAN AMERICAN: 77 mL/min (ref >=60)
GFR, Est African American: 89 mL/min (ref >=60)
GLUCOSE: 79 mg/dL (ref 65–99)
POTASSIUM: 4.7 mmol/L (ref 3.5–5.3)
SODIUM: 137 mmol/L (ref 135–146)
Total Bilirubin: 0.5 mg/dL (ref 0.2–1.2)
Total Protein: 6.8 g/dL (ref 6.1–8.1)

## 2016-05-12 LAB — LIPID PANEL
CHOL/HDL RATIO: 4.2 ratio (ref <=5.0)
Cholesterol: 295 mg/dL — ABNORMAL HIGH (ref 125–200)
HDL: 71 mg/dL (ref >=46)
LDL CALC: 186 mg/dL — AB (ref <130)
Triglycerides: 188 mg/dL — ABNORMAL HIGH (ref <150)
VLDL: 38 mg/dL — AB (ref <30)

## 2016-05-12 LAB — TSH: TSH: 8.03 m[IU]/L — AB

## 2016-05-14 ENCOUNTER — Other Ambulatory Visit (HOSPITAL_COMMUNITY): Payer: Self-pay | Admitting: Psychiatry

## 2016-05-19 NOTE — Telephone Encounter (Signed)
Received medication from [facility] for Lamictal 25mg . Per Dr. Gilmore LarocheAkhtar, medication is denied. Refill was sent to pharmacy on 05/11/16 for Lamictal 25mg , #90. Pt is schedule for a appt on 06/29/16.

## 2016-06-29 ENCOUNTER — Ambulatory Visit (HOSPITAL_COMMUNITY): Payer: Self-pay | Admitting: Psychiatry

## 2016-07-07 ENCOUNTER — Other Ambulatory Visit: Payer: Self-pay | Admitting: Sports Medicine

## 2016-07-07 DIAGNOSIS — F332 Major depressive disorder, recurrent severe without psychotic features: Secondary | ICD-10-CM

## 2016-07-07 MED ORDER — DULOXETINE HCL 30 MG PO CPEP: 30.0000 mg | ORAL_CAPSULE | Freq: Every day | ORAL | Status: DC

## 2016-07-08 ENCOUNTER — Telehealth: Payer: Self-pay | Admitting: Family Medicine

## 2016-07-08 NOTE — Telephone Encounter (Signed)
Pt called clinic to see if PCP would treat her for C.Diff even though she's never been able to provide a sample. Informed Pt we would have to have a sample to justify treatment.  Pt also states she got a phone call about her lab work from May, she "typically skips a day or more" on her thyroid medication. Will let PCP know. Advised Pt to do her best to take this medication daily.

## 2016-07-08 NOTE — Telephone Encounter (Signed)
Offered this to Pt, would like to come in for an appt prior.

## 2016-07-08 NOTE — Telephone Encounter (Signed)
Pt returned clinic call requesting a referral to and ear specialist because her right ear is "closing." Pt denies any trama, pain, drainage, itching, swelling. Pt just reports loss of hearing and "feels like its closing." Will route.

## 2016-07-08 NOTE — Telephone Encounter (Signed)
I agree. Have t have a positive test. She is welcome to get stool kit for testing and take it home and collect specimen and then drop off.

## 2016-07-08 NOTE — Telephone Encounter (Signed)
Left VM for Pt to come in for appt with PCP, per PCP request.

## 2016-07-13 ENCOUNTER — Other Ambulatory Visit (HOSPITAL_COMMUNITY): Payer: Self-pay | Admitting: Psychiatry

## 2016-07-13 NOTE — Telephone Encounter (Signed)
Faxed received from [facility] requesting a refill for Lamotrigine. Per Dr. Gilmore Laroche, refill request denied. Pt was seen on 5/22. lvm for pt to contact for an appt.

## 2016-07-27 ENCOUNTER — Ambulatory Visit: Payer: Self-pay | Admitting: Family Medicine

## 2016-07-28 ENCOUNTER — Ambulatory Visit (INDEPENDENT_AMBULATORY_CARE_PROVIDER_SITE_OTHER): Payer: 59 | Admitting: Psychiatry

## 2016-07-28 ENCOUNTER — Encounter (HOSPITAL_COMMUNITY): Payer: Self-pay | Admitting: Psychiatry

## 2016-07-28 ENCOUNTER — Ambulatory Visit (INDEPENDENT_AMBULATORY_CARE_PROVIDER_SITE_OTHER): Payer: 59 | Admitting: Physician Assistant

## 2016-07-28 ENCOUNTER — Encounter: Payer: Self-pay | Admitting: Physician Assistant

## 2016-07-28 VITALS — BP 124/84 | HR 80 | Ht 68.0 in | Wt 161.0 lb

## 2016-07-28 VITALS — BP 116/76 | HR 66 | Ht 68.0 in | Wt 160.2 lb

## 2016-07-28 DIAGNOSIS — H9193 Unspecified hearing loss, bilateral: Secondary | ICD-10-CM | POA: Diagnosis not present

## 2016-07-28 DIAGNOSIS — F0630 Mood disorder due to known physiological condition, unspecified: Secondary | ICD-10-CM

## 2016-07-28 DIAGNOSIS — F331 Major depressive disorder, recurrent, moderate: Secondary | ICD-10-CM | POA: Diagnosis not present

## 2016-07-28 DIAGNOSIS — H938X3 Other specified disorders of ear, bilateral: Secondary | ICD-10-CM

## 2016-07-28 DIAGNOSIS — H938X9 Other specified disorders of ear, unspecified ear: Secondary | ICD-10-CM | POA: Insufficient documentation

## 2016-07-28 DIAGNOSIS — F419 Anxiety disorder, unspecified: Secondary | ICD-10-CM

## 2016-07-28 DIAGNOSIS — H9190 Unspecified hearing loss, unspecified ear: Secondary | ICD-10-CM | POA: Insufficient documentation

## 2016-07-28 DIAGNOSIS — F411 Generalized anxiety disorder: Secondary | ICD-10-CM | POA: Diagnosis not present

## 2016-07-28 MED ORDER — LAMOTRIGINE 25 MG PO TABS: 75.0000 mg | ORAL_TABLET | Freq: Every day | ORAL | 1 refills | Status: DC

## 2016-07-28 MED ORDER — BUSPIRONE HCL 15 MG PO TABS: 15.0000 mg | ORAL_TABLET | Freq: Two times a day (BID) | ORAL | 1 refills | Status: DC

## 2016-07-28 NOTE — Patient Instructions (Signed)
Lower dose of buspar to twice a day

## 2016-07-28 NOTE — Progress Notes (Signed)
Patient ID: [name], female   DOB: 11-08-1956, 60 y.o.   MRN: [id number]   [facility]  Sharon Ambleancy Solt 11-08-1956  Date: 07/28/2016  Chief Complaint:  Follow Up. Depression and anxiety  History of Chief Complaint:   HPI Comments: Ms. [name] is a 60 y/o female with a past psychiatric history significant for Major Depression, Recurrent severe, generalized anxiety and bereavement. The patient returns  for psychiatric services and medication management.  Her depression dates back to 2008 when she lost her mom and also her brother was using drugs.   She is getting epidural injections for her shoulder pain and may now get nerve block surgery. Anxiety and depression : cymbalta helps but pain aggravates her mood.  Last visit we increased her Lamictal 75 mg that has shown some improvement she feels more optimistic and is also liking her job. Some improvement in mood . Anxiety: related to stress and anniversaries.  . Severity: Depression: has not worsened : 6/10 (0=Very depressed; 5=Neutral; 10=Very Happy)  Anxiety- 4/10 (0=no anxiety; 5= moderate/tolerable anxiety; 10= panic attacks) (somewhat worsened)  . Duration: The patient notes depression since her early 5230's; and worsened since 2008 with the death of her parents.   . Timing: variable . Context: Being late and variable factors related to anniversaries and stress times.  . Modifying factors: Mood improves with exercise. No psychotic symptoms or mania  Review of Systems  Constitutional: Negative for fever.  Cardiovascular: Negative for palpitations.  Skin: Negative for rash.  Psychiatric/Behavioral: Negative for depression, substance abuse and suicidal ideas. The patient is nervous/anxious.      Vitals:   07/28/16 1511  BP: 116/76  BP Location: Right Arm  Patient Position: Sitting  Cuff Size: Normal  Pulse: 66  SpO2: 96%  Weight: 160 lb 3.2 oz (72.7 kg)  Height: 5\' 8"  (1.727 m)    Physical Exam  Vitals reviewed.  Constitutional: She appears well-developed and well-nourished. No distress.  Skin: She is not diaphoretic.   Musculoskeletal: Strength & Muscle Tone: within normal limits Gait & Station: normal Patient leans: N/A   Past Medical History: Reviewed Past Medical History:  Diagnosis Date  . Anxiety   . Arthritis    Right Hip  . Breast disorder     Rt Breast Calcifications  . Chronic cystitis   . Depression   . Dyspareunia   . Hypercholesterolemia   . Hypothyroidism   . Plantar fasciitis of left foot    2013  . Torn meniscus 11/13/15   Rt knee   Allergies: Reviewed Allergies  Allergen Reactions  . Bupropion Hcl Other (See Comments)    irritabillity  . Sulfa Antibiotics Itching   Current Medications: Reviewed Current Outpatient Prescriptions on File Prior to Visit  Medication Sig Dispense Refill  . albuterol (PROAIR HFA) 108 (90 BASE) MCG/ACT inhaler Inhale 2 puffs into the lungs every 6 (six) hours as needed for wheezing or shortness of breath. Before exercise. 1 Inhaler 0  . calcium-vitamin D (OSCAL WITH D) 500-200 MG-UNIT per tablet Take 2 tablets by mouth daily.     . cetirizine (ZYRTEC) 5 MG tablet Take 10 mg by mouth daily.    . clonazePAM (KLONOPIN) 0.5 MG tablet TAKE 1 TABLET BY MOUTH DAILY AS NEEDED 90 tablet 0  . DULoxetine (CYMBALTA) 30 MG capsule Take 1 capsule (30 mg total) by mouth daily. 90 capsule 3  . HYDROcodone-acetaminophen (NORCO/VICODIN) 5-325 MG tablet Take 1 tablet by mouth every 8 (eight) hours  as needed for moderate pain. (Patient taking differently: Take 2 tablets by mouth 2 (two) times daily as needed for moderate pain. ) 10 tablet 0  . levothyroxine (SYNTHROID, LEVOTHROID) 88 MCG tablet TAKE 1 TABLET (88 MCG TOTAL) BY MOUTH DAILY BEFORE BREAKFAST. DUE FOR LAB WORK. 90 tablet 0  . Multiple Vitamin (MULTIVITAMIN) tablet Take 1 tablet by mouth daily.      . nitrofurantoin (MACRODANTIN) 50 MG capsule Take 50 mg by  mouth daily as needed.    . valACYclovir (VALTREX) 500 MG tablet Take 500 mg by mouth daily as needed.     No current facility-administered medications on file prior to visit.      Substance Abuse History in the last 12 months: Reviewed Social History  Substance Use Topics  . Smoking status: Former Smoker    Quit date: 09/20/1982  . Smokeless tobacco: Never Used  . Alcohol use 0.0 oz/week     Comment: Use is 1-2 a month.  Caffeine: 2 cups a day   Family History: Reviewed Family History   Problem  Relation  Age of Onset   .  COPD     .  Aneurysm     .  Anxiety disorder  Mother    .  Depression  Mother    .  Alcohol abuse  Father    .  Heart murmur  Father    .  Drug abuse  Brother    .  Anxiety disorder  Brother    .  Depression  Brother    .  Dementia  Maternal Grandfather     Psychiatric Specialty Exam:  Objective: Appearance: Casual and Well Groomed   Eye Contact:: Good   Speech: Clear and Coherent and Normal Rate   Volume: Normal   Mood:  Near euthymic   Affect: Congruent   Thought Process: Coherent, Logical and Loose   Orientation: Full   Thought Content: WDL   Suicidal Thoughts: No   Homicidal Thoughts: No   Judgement: Fair   Insight: Fair   Psychomotor Activity: Normal   Akathisia: No   Handed: Left   Memory: Immediate 3/3, recent: 1/3   . Assets: Communication Skills  Desire for Improvement  Financial Resources/Insurance  Housing  Leisure Time  Physical Health  Resilience  Transportation  Vocational/Educational    Laboratory/X-Ray  Psychological Evaluation(s)   NONE  NONE    Assessment:  AXIS I  Major Depression, Recurrent severe- (worsening). Generalized anxiety disorder. bereavement  AXIS II  No diagnosis   AXIS III  Past Medical History    Diagnosis  Date    .  Hypercholesterolemia     .  Arthritis       Right Hip    .  Hypothyroidism     .  Chronic cystitis     .  Decreased libido     .  Dyspareunia     .  Breast disorder       Rt  Breast Calcifications    .  Depression     .  Anxiety     .  Plantar fasciitis of left foot       2013      AXIS IV  other psychosocial or environmental problems   AXIS V  65   Treatment Plan/Recommendations:   Plan of Care:  PLAN:  1. Affirm with the patient that the medications are taken as ordered. Patient  expressed understanding of how their medications were to be used.  Laboratory:    Psychotherapy: Therapy: brief supportive therapy provided.  Discussed psychosocial stressors in detail. More than 50% of the visit was spent on individual therapy/counseling.   Medications:  Continue  the following psychiatric medications as written prior to this appointment with the following changes:: continue following for depression, anxiety.  A) continue cymbalta 30mg  bid.  Augment continue with lamictal for mood stability  75mg  qhs. Discussed side effects including rash.   b) continue buspar but lower it to bid to avoid increase serotonin.   c) Asked patient to limit and try to discontinue clonazepam. She uses it seldom. She can take quarter to half at night for anxiety and sleep.  Bereavement ; baseline. Worse during anniversaries      Consultations: follow with primary care  Safety Concerns:   Patient told to call clinic if any problems occur. Patient advised to go to  ER  if she should develop SI/HI, side effects, or if symptoms worsen. Has crisis numbers to call if needed.       Time spent: 25 minutes.     Thresa Ross, MD

## 2016-07-28 NOTE — Patient Instructions (Addendum)
Try 2 sprays each nostril for flonase daily. Consider audiology referral if hearing loss concerns makes you want to seek hearing devices.   Baker Cyst A Baker cyst is a sac-like structure that forms in the back of the knee. It is filled with the same fluid that is located in your knee. This fluid lubricates the bones and cartilage of the knee and allows them to move over each other more easily. CAUSES  When the knee becomes injured or inflamed, increased fluid forms in the knee. When this happens, the joint lining is pushed out behind the knee and forms the Baker cyst. This cyst may also be caused by inflammation from arthritic conditions and infections. SIGNS AND SYMPTOMS  A Baker cyst usually has no symptoms. When the cyst is substantially enlarged:  You may feel pressure behind the knee, stiffness in the knee, or a mass in the area behind the knee.  You may develop pain, redness, and swelling in the calf. This can suggest a blood clot and requires evaluation by your health care provider. DIAGNOSIS  A Baker cyst is most often found during an ultrasound exam. This exam may have been performed for other reasons, and the cyst was found incidentally. Sometimes an MRI is used. This picks up other problems within a joint that an ultrasound exam may not. If the Baker cyst developed immediately after an injury, X-ray exams may be used to diagnose the cyst. TREATMENT  The treatment depends on the cause of the cyst. Anti-inflammatory medicines and rest often will be prescribed. If the cyst is caused by a bacterial infection, antibiotic medicines may be prescribed.  HOME CARE INSTRUCTIONS   If the cyst was caused by an injury, for the first 24 hours, keep the injured leg elevated on 2 pillows while lying down.  For the first 24 hours while you are awake, apply ice to the injured area:  Put ice in a plastic bag.  Place a towel between your skin and the bag.  Leave the ice on for 20 minutes, 2-3  times a day.  Only take over-the-counter or prescription medicines for pain, discomfort, or fever as directed by your health care provider.  Only take antibiotic medicine as directed. Make sure to finish it even if you start to feel better. MAKE SURE YOU:   Understand these instructions.  Will watch your condition.  Will get help right away if you are not doing well or get worse.   This information is not intended to replace advice given to you by your health care provider. Make sure you discuss any questions you have with your health care provider.   Document Released: 12/07/2005 Document Revised: 09/27/2013 Document Reviewed: 07/19/2013 Elsevier Interactive Patient Education [employer]2016 [employer]

## 2016-07-28 NOTE — Progress Notes (Signed)
   Subjective:    Patient ID: Sharon AmbleNancy Greene, female    DOB: [birth year], 60 y.o.   MRN: [id number]  HPI Patient is a 60 year old female who presents to the clinic with bilateral sensation of her ears closing up. She has had some ongoing hearing loss. She is unsure if this is worsened. It seems like her main concern might be that her ear buds feel like they do not fit as they once did. She denies any ear pain or ear drainage. She denies any fever, chills, sinus pressure, itching. She has not tried anything to make better. She is concerned she has a brain tumor.    Review of Systems    see HPI.  Objective:   Physical Exam  Constitutional: She is oriented to person, place, and time. She appears well-developed and well-nourished.  HENT:  Head: Normocephalic and atraumatic.  Right Ear: External ear normal.  Left Ear: External ear normal.  Nose: Nose normal.  Mouth/Throat: Oropharynx is clear and moist. No oropharyngeal exudate.  Bilateral ear canals are patent without any cerumen.  TM clear with some minimal dullness at base. No erythema, blood or pus.   Gross hearing inf left hear not able to hear word at a whisper.    Lymphadenopathy:    She has no cervical adenopathy.  Neurological: She is alert and oriented to person, place, and time.  Psychiatric: She has a normal mood and affect. Her behavior is normal.          Assessment & Plan:  Abnormal bilateral ear sensation/hearing loss- she did score abnormal hearing test which seems to be worsening. Unsure if this is causing the sensation of her "ears closing up". Reassured patient that I do not think this is a brain tumor and I see nothing abnormal with her external ear. She could try some flonase for the minimal amt of fluid at the base of ears. Will make referral to audiology.

## 2016-08-14 ENCOUNTER — Ambulatory Visit (HOSPITAL_COMMUNITY): Payer: Self-pay | Admitting: Psychiatry

## 2016-08-17 ENCOUNTER — Other Ambulatory Visit: Payer: Self-pay | Admitting: Family Medicine

## 2016-09-03 ENCOUNTER — Other Ambulatory Visit: Payer: Self-pay | Admitting: Family Medicine

## 2016-09-15 ENCOUNTER — Other Ambulatory Visit: Payer: Self-pay | Admitting: Family Medicine

## 2016-10-16 ENCOUNTER — Encounter (HOSPITAL_COMMUNITY): Payer: Self-pay | Admitting: Psychiatry

## 2016-10-16 ENCOUNTER — Ambulatory Visit (INDEPENDENT_AMBULATORY_CARE_PROVIDER_SITE_OTHER): Payer: 59 | Admitting: Psychiatry

## 2016-10-16 DIAGNOSIS — F0630 Mood disorder due to known physiological condition, unspecified: Secondary | ICD-10-CM | POA: Diagnosis not present

## 2016-10-16 DIAGNOSIS — F419 Anxiety disorder, unspecified: Secondary | ICD-10-CM | POA: Diagnosis not present

## 2016-10-16 DIAGNOSIS — F411 Generalized anxiety disorder: Secondary | ICD-10-CM

## 2016-10-16 DIAGNOSIS — F331 Major depressive disorder, recurrent, moderate: Secondary | ICD-10-CM

## 2016-10-16 MED ORDER — BUSPIRONE HCL 15 MG PO TABS: 15.0000 mg | ORAL_TABLET | Freq: Two times a day (BID) | ORAL | 1 refills | Status: DC

## 2016-10-16 MED ORDER — LAMOTRIGINE 25 MG PO TABS: 75.0000 mg | ORAL_TABLET | Freq: Every day | ORAL | 1 refills | Status: DC

## 2016-10-16 NOTE — Progress Notes (Signed)
Patient ID: [name], female   DOB: December 02, 1956, 60 y.o.   MRN: [id number]   [facility] Follow-up Outpatient Visit  Charlestine Rookstool 04/13/1956  Date: 07/28/2016  Chief Complaint:  Follow Up. Depression and anxiety  History of Chief Complaint:   HPI Comments: Ms. Collymore is a 60 y/o female with a past psychiatric history significant for Major Depression, Recurrent severe, generalized anxiety and bereavement. The patient returns  for psychiatric services and medication management.  Her depression dates back to 29-May-2007 when she lost her mom and also her brother was using drugs.   She is getting epidural injections for her shoulder pain and may now get nerve block surgery. Worried about surgery. Anxiety and depression : cymbalta helps   Mood disorder: on lamictal 75mg  hellps. No rash  Anxiety: related to stress and anniversaries.  . Severity: Depression: has not worsened : 7/10 (0=Very depressed; 5=Neutral; 10=Very Happy)  Anxiety- 4/10 (0=no anxiety; 5= moderate/tolerable anxiety; 10= panic attacks) (somewhat worsened)  . Duration: The patient notes depression since her early 20's; and worsened since 2007/05/29 with the death of her parents.   . Timing: variable . Context: Being late and variable factors related to anniversaries and stress times.visited [place] where her mom used to live. Had some closure of grief.   . Modifying factors: Mood improves with exercise. No psychotic symptoms or mania  Review of Systems  Constitutional: Negative for fever.  Cardiovascular: Negative for chest pain.  Skin: Negative for rash.  Psychiatric/Behavioral: Negative for depression, substance abuse and suicidal ideas.     There were no vitals filed for this visit. Physical Exam  Vitals reviewed.  Constitutional: She appears well-developed and well-nourished. No distress.  Skin: She is not diaphoretic.   Musculoskeletal: Strength & Muscle Tone: within normal limits Gait & Station:  normal Patient leans: N/A   Past Medical History: Reviewed Past Medical History:  Diagnosis Date  . Anxiety   . Arthritis    Right Hip  . Breast disorder     Rt Breast Calcifications  . Chronic cystitis   . Depression   . Dyspareunia   . Hypercholesterolemia   . Hypothyroidism   . Plantar fasciitis of left foot    05/28/12  . Torn meniscus 11/13/15   Rt knee   Allergies: Reviewed Allergies  Allergen Reactions  . Bupropion Hcl Other (See Comments)    irritabillity  . Sulfa Antibiotics Itching   Current Medications: Reviewed Current Outpatient Prescriptions on File Prior to Visit  Medication Sig Dispense Refill  . albuterol (PROAIR HFA) 108 (90 BASE) MCG/ACT inhaler Inhale 2 puffs into the lungs every 6 (six) hours as needed for wheezing or shortness of breath. Before exercise. 1 Inhaler 0  . calcium-vitamin D (OSCAL WITH D) 500-200 MG-UNIT per tablet Take 2 tablets by mouth daily.     . cetirizine (ZYRTEC) 5 MG tablet Take 10 mg by mouth daily.    . clonazePAM (KLONOPIN) 0.5 MG tablet TAKE 1 TABLET BY MOUTH DAILY AS NEEDED 90 tablet 0  . DULoxetine (CYMBALTA) 30 MG capsule Take 1 capsule (30 mg total) by mouth daily. 90 capsule 3  . HYDROcodone-acetaminophen (NORCO/VICODIN) 5-325 MG tablet Take 1 tablet by mouth every 8 (eight) hours as needed for moderate pain. (Patient taking differently: Take 2 tablets by mouth 2 (two) times daily as needed for moderate pain. ) 10 tablet 0  . levothyroxine (SYNTHROID, LEVOTHROID) 88 MCG tablet Take 1 tablet (88 mcg total) by mouth daily before breakfast. 30  tablet 0  . Multiple Vitamin (MULTIVITAMIN) tablet Take 1 tablet by mouth daily.      . nitrofurantoin (MACRODANTIN) 50 MG capsule Take 50 mg by mouth daily as needed.    . valACYclovir (VALTREX) 500 MG tablet Take 500 mg by mouth daily as needed.     No current facility-administered medications on file prior to visit.      Substance Abuse History in the last 12 months:  Reviewed Social History  Substance Use Topics  . Smoking status: Former Smoker    Quit date: 09/20/1982  . Smokeless tobacco: Never Used  . Alcohol use 0.0 oz/week     Comment: Use is 1-2 a month.  Caffeine: 2 cups a day   Family History: Reviewed Family History   Problem  Relation  Age of Onset   .  COPD     .  Aneurysm     .  Anxiety disorder  Mother    .  Depression  Mother    .  Alcohol abuse  Father    .  Heart murmur  Father    .  Drug abuse  Brother    .  Anxiety disorder  Brother    .  Depression  Brother    .  Dementia  Maternal Grandfather     Psychiatric Specialty Exam:  Objective: Appearance: Casual and Well Groomed   Eye Contact:: Good   Speech: Clear and Coherent and Normal Rate   Volume: Normal   Mood:  Near euthymic   Affect: Congruent   Thought Process: Coherent, Logical and Loose   Orientation: Full   Thought Content: WDL   Suicidal Thoughts: No   Homicidal Thoughts: No   Judgement: Fair   Insight: Fair   Psychomotor Activity: Normal   Akathisia: No   Handed: Left   Memory: Immediate 3/3, recent: 1/3   . Assets: Communication Skills  Desire for Improvement  Financial Resources/Insurance  Housing  Leisure Time  Physical Health  Resilience  Transportation  Vocational/Educational    Laboratory/X-Ray  Psychological Evaluation(s)   NONE  NONE    Assessment:  AXIS I  Major Depression, Recurrent severe- (worsening). Generalized anxiety disorder. bereavement  AXIS II  No diagnosis   AXIS III  Past Medical History    Diagnosis  Date    .  Hypercholesterolemia     .  Arthritis       Right Hip    .  Hypothyroidism     .  Chronic cystitis     .  Decreased libido     .  Dyspareunia     .  Breast disorder       Rt Breast Calcifications    .  Depression     .  Anxiety     .  Plantar fasciitis of left foot       2013      AXIS IV  other psychosocial or environmental problems   AXIS V  64   Treatment Plan/Recommendations:   Plan of  Care:  PLAN:  1. Affirm with the patient that the medications are taken as ordered. Patient  expressed understanding of how their medications were to be used.    Laboratory:    Psychotherapy: Therapy: brief supportive therapy provided.  Discussed psychosocial stressors in detail. More than 50% of the visit was spent on individual therapy/counseling.   Medications:  Continue  the following psychiatric medications as written prior to this appointment with the following changes::  continue following for depression, anxiety.  A) continue cymbalta 30mg  bid.  Augment continue with lamictal for mood stability  75mg  qhs. Discussed side effects including rash.   b) continue buspar bid . Refills sent  c) Asked patient to limit and try to discontinue clonazepam. She uses it seldom. She can take quarter to half at night for anxiety and sleep.  Bereavement ; baseline. Worse during anniversaries. But getting better      Consultations: follow with primary care  Safety Concerns:   Patient told to call clinic if any problems occur. Patient advised to go to  ER  if she should develop SI/HI, side effects, or if symptoms worsen. Has crisis numbers to call if needed.     Follow up in 2-3 months  Time spent: 25 minutes.     Thresa RossNadeem Shanora Christensen, MD

## 2016-10-19 ENCOUNTER — Other Ambulatory Visit: Payer: Self-pay | Admitting: Family Medicine

## 2016-10-19 ENCOUNTER — Ambulatory Visit (HOSPITAL_COMMUNITY): Payer: Self-pay | Admitting: Psychiatry

## 2016-11-09 ENCOUNTER — Ambulatory Visit: Payer: 59 | Admitting: Audiology

## 2016-11-09 ENCOUNTER — Ambulatory Visit: Payer: 59 | Attending: Physician Assistant | Admitting: Audiology

## 2016-11-09 DIAGNOSIS — H9191 Unspecified hearing loss, right ear: Secondary | ICD-10-CM | POA: Diagnosis present

## 2016-11-09 DIAGNOSIS — H9192 Unspecified hearing loss, left ear: Secondary | ICD-10-CM

## 2016-11-09 DIAGNOSIS — R94128 Abnormal results of other function studies of ear and other special senses: Secondary | ICD-10-CM | POA: Diagnosis present

## 2016-11-09 DIAGNOSIS — R94120 Abnormal auditory function study: Secondary | ICD-10-CM | POA: Diagnosis present

## 2016-11-09 DIAGNOSIS — Z01118 Encounter for examination of ears and hearing with other abnormal findings: Secondary | ICD-10-CM | POA: Diagnosis not present

## 2016-11-09 DIAGNOSIS — H906 Mixed conductive and sensorineural hearing loss, bilateral: Secondary | ICD-10-CM | POA: Insufficient documentation

## 2016-11-09 NOTE — Procedures (Signed)
Outpatient [facility]  [address]  [place], [place] [address]  [phone]   Audiological Evaluation  Patient Name: [name]   Status: Outpatient   DOB: 09/05/1956    Diagnosis: Hearing Loss                Abnormal hearing screen MRN: [id number] Date:  11/09/2016     Referent: Nani GasserMETHENEY,CATHERINE, MD  History: Sharon Ambleancy Vayda was seen for an audiological evaluation. Primary Concern: "Trouble hearing and feels like right ear has been closing up" x 3- 6 months.  No precipitating event.  History of hearing problems: Y  History of ear infections:   N History of dizziness/vertigo:   N History of balance issues:  N Tinnitus: A little on the right - intermittent and rare "high pitched" Sound sensitivity: Y such as "screaming children" or if something drops is startled.  Has always startled very easily, runs in the family.  History of occupational noise exposure: N History of hypertension: N History of diabetes:  N Family history of hearing loss:  Father, around age 61 years of age. Other concerns: A little bit of difficulty hearing in background noise over the past 6 months.   Evaluation: Conventional pure tone audiometry from 250Hz  - 8000Hz  with using insert earphones.  Hearing Thresholds: Right ear:  Thresholds of 45-55 dBHL from 250Hz  - 2000Hz  and 30-35 dBHL from 4000Hz  - 8000Hz . The hearing loss is mixed.  Left ear:    Thresholds of 25-35 dBHL from 250Hz  - 8000Hz . The hearing loss is primarily sensorineural with a mixed component at 1000Hz  - 1500Hz . Reliability is good Word recognition (at comfortably loud volumes) using recorded NU-6 word lists at Most Comfortable Loudness Levels, in quiet.  Right ear: 96% at 65 dBHL with contralateral masking.  Left ear:   96% at 65 dBHL with contralateral masking. Tympanometry (middle ear function) with ipsilateral acoustic reflexes.  Right ear: Normal (Type Ad) with absent acoustic reflex at 1000Hz .  Left ear:  Normal (Type A) with 90dBHL acoustic reflex at 1000Hz .  CONCLUSION:      Sharon AmbleNancy Kocsis needs referral to an Ear, Nose and Throat physician.       Sharon Ambleancy Maes has poorer low frequency hearing loss on the right side with a mixed hearing loss component.  The right ear has a moderate low frequency hearing loss improvng to a slight to mild high frequency hearing loss.  Hearing loss on the right side is mixed and there is excessive tympanic membrane compliance on the right side.  The left ear has a slight to mild hearing loss throughout the speech range with a primarily sensorineural component however there is a mixed mid frequency range component.  This amount of hearing loss will adversely affect speech communication at normal conversational speech levels - especially on the right side.   Word recognition is excellent in each ear a conversational speech levels, although the right side Most Comfortable Loudness Level is slightly louder than on the left side.   RECOMMENDATIONS: 1.   Referral to an Ear, Nose and Throat physician because of the mixed significant hearing loss on the right side, excessive tympanic membrane compliance and sensation of the right ear "stopping up".  Please note, if possible Sharon Ambleancy Barnett would like to be [place] such as to [facility] (in [place]). 2.   Monitor hearing closely with a repeat audiological evaluation in 3 months (earlier if there is any change in hearing or ear  pressure).  This appointment may be completed at the ENT office.  Please monitor hearing because lamotrigine may have a slight associated risk of hearing loss in females over 61 years o27f age. 3. Strategies that help improve hearing include: A) Face the speaker directly. Optimal is having the speakers face well - lit.  Unless amplified, being within 3-6 feet of the speaker will enhance word recognition. B) Avoid having the speaker back-lit as this will minimize the  ability to use cues from lip-reading, facial expression and gestures. C)  Word recognition is poorer in background noise. For optimal word recognition, turn off the TV, radio or noisy fan when engaging in conversation. In a restaurant, try to sit away from noise sources and close to the primary speaker.  D)  Ask for topic clarification from time to time in order to remain in the conversation.  Most people don't mind repeating or clarifying a point when asked.  If needed, explain the difficulty hearing in background noise or hearing loss.  Deborah L. Kate SableWoodward, Au.D., CCC-A Doctor of Audiology 11/09/2016

## 2016-11-09 NOTE — Addendum Note (Signed)
Addended by: Nani GasserMETHENEY, CATHERINE D on: 11/09/2016 04:58 PM   Modules accepted: Orders

## 2016-11-20 ENCOUNTER — Other Ambulatory Visit: Payer: Self-pay | Admitting: Family Medicine

## 2016-11-20 DIAGNOSIS — Z1231 Encounter for screening mammogram for malignant neoplasm of breast: Secondary | ICD-10-CM

## 2016-11-27 ENCOUNTER — Encounter: Payer: Self-pay | Admitting: Family Medicine

## 2016-11-27 ENCOUNTER — Ambulatory Visit (INDEPENDENT_AMBULATORY_CARE_PROVIDER_SITE_OTHER): Payer: 59 | Admitting: Family Medicine

## 2016-11-27 VITALS — BP 114/69 | HR 67 | Ht 68.0 in | Wt 160.0 lb

## 2016-11-27 DIAGNOSIS — E038 Other specified hypothyroidism: Secondary | ICD-10-CM

## 2016-11-27 DIAGNOSIS — N390 Urinary tract infection, site not specified: Secondary | ICD-10-CM

## 2016-11-27 DIAGNOSIS — B009 Herpesviral infection, unspecified: Secondary | ICD-10-CM

## 2016-11-27 MED ORDER — NITROFURANTOIN MACROCRYSTAL 50 MG PO CAPS: 50.0000 mg | ORAL_CAPSULE | Freq: Every day | ORAL | 0 refills | Status: DC | PRN

## 2016-11-27 MED ORDER — VALACYCLOVIR HCL 500 MG PO TABS: 500.0000 mg | ORAL_TABLET | Freq: Every day | ORAL | 1 refills | Status: DC | PRN

## 2016-11-27 NOTE — Progress Notes (Signed)
Subjective:    Patient ID: [name], female    DOB: November 28, 1956, 60 y.o.   MRN: [id number]  HPI Hypothyroidism-no recent skin or hair changes. No significant weight changes. She's currently on levothyroxine 88 g daily.Last level was elevated. We had tried to call her and left a couple of messages but did not hear back from her. Her TSH was around 8 we weren't sure if she was not taking her medication consistently over his dose neck she needed to be adjusted.  Recurrent urinary tract infections-she is here today to renew her prescription for her Macrobid.She says she just takes it maybe once every 2-3 months. Usually 1 dose [name] out her symptoms. She just takes it if she starts to develop urinary symptoms. She's no longer's actually active with her husband.  Herpes simplex-she usually breaks out near the low back rectal area. She says she'll typically have an outbreak once or twice a month. Normally it's triggered when she's stressed out. - needs refill on her Valtrex. She uses it PRN.    Review of Systems  BP 114/69   Pulse 67   Ht 5\' 8"  (1.727 m)   Wt 160 lb (72.6 kg)   SpO2 97%   BMI 24.33 kg/m     Allergies  Allergen Reactions  . Bupropion Hcl Other (See Comments)    irritabillity  . Sulfa Antibiotics Itching    Past Medical History:  Diagnosis Date  . Anxiety   . Arthritis    Right Hip  . Breast disorder     Rt Breast Calcifications  . Chronic cystitis   . Depression   . Dyspareunia   . Hypercholesterolemia   . Hypothyroidism   . Plantar fasciitis of left foot    2013  . Torn meniscus 11/13/15   Rt knee    Past Surgical History:  Procedure Laterality Date  . BREAST ENHANCEMENT SURGERY    . KNEE SURGERY Right 11/16  . plastic surgry to eyes    . Scoliosis repair with Harrington Rods      Social History   Social History  . Marital status: Married    Spouse name: N/A  . Number of children: N/A  . Years of education: N/A   Occupational History   . CNA    Social History Main Topics  . Smoking status: Former Smoker    Quit date: 09/20/1982  . Smokeless tobacco: Never Used  . Alcohol use 0.0 oz/week     Comment: Use is 1-2 a month.  . Drug use: No  . Sexual activity: No   Other Topics Concern  . 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      Family History  Problem Relation Age of Onset  . Anxiety disorder Mother   . Depression Mother   . COPD Father     smoker  . Alcohol abuse Father   . Heart murmur Father   . Aneurysm    . Drug abuse Brother   . Anxiety disorder Brother   . Depression Brother   . Dementia Maternal Grandfather     Outpatient Encounter Prescriptions as of 11/27/2016  Medication Sig  . busPIRone (BUSPAR) 15 MG tablet Take 1 tablet (15 mg total) by mouth 2 (two) times daily.  . calcium-vitamin D (OSCAL WITH D) 500-200 MG-UNIT per tablet Take 2 tablets by mouth daily.   . cetirizine (ZYRTEC) 5 MG tablet Take 10 mg by  mouth daily.  . clonazePAM (KLONOPIN) 0.5 MG tablet TAKE 1 TABLET BY MOUTH DAILY AS NEEDED  . DULoxetine (CYMBALTA) 30 MG capsule Take 1 capsule (30 mg total) by mouth daily.  Marland Kitchen. lamoTRIgine (LAMICTAL) 25 MG tablet Take 3 tablets (75 mg total) by mouth daily. Take 3 a day  . levothyroxine (SYNTHROID, LEVOTHROID) 88 MCG tablet TAKE 1 TABLET (88 MCG TOTAL) BY MOUTH DAILY BEFORE BREAKFAST.  . Multiple Vitamin (MULTIVITAMIN) tablet Take 1 tablet by mouth daily.    Marland Kitchen. oxyCODONE-acetaminophen (PERCOCET) 10-325 MG tablet   . [DISCONTINUED] albuterol (PROAIR HFA) 108 (90 BASE) MCG/ACT inhaler Inhale 2 puffs into the lungs every 6 (six) hours as needed for wheezing or shortness of breath. Before exercise.  . [DISCONTINUED] nitrofurantoin (MACRODANTIN) 50 MG capsule Take 50 mg by mouth daily as needed.  . [DISCONTINUED] valACYclovir (VALTREX) 500 MG tablet Take 500 mg by mouth daily as needed.  . nitrofurantoin (MACRODANTIN) 50 MG capsule Take 1 capsule  (50 mg total) by mouth daily as needed.  . valACYclovir (VALTREX) 500 MG tablet Take 1 tablet (500 mg total) by mouth daily as needed.  . [DISCONTINUED] HYDROcodone-acetaminophen (NORCO/VICODIN) 5-325 MG tablet Take 1 tablet by mouth every 8 (eight) hours as needed for moderate pain. (Patient taking differently: Take 2 tablets by mouth 2 (two) times daily as needed for moderate pain. )   No facility-administered encounter medications on file as of 11/27/2016.          Objective:   Physical Exam  Constitutional: She is oriented to person, place, and time. She appears well-developed and well-nourished.  HENT:  Head: Normocephalic and atraumatic.  Neck: Neck supple. No thyromegaly present.  Cardiovascular: Normal rate, regular rhythm and normal heart sounds.   Pulmonary/Chest: Effort normal and breath sounds normal.  Lymphadenopathy:    She has no cervical adenopathy.  Neurological: She is alert and oriented to person, place, and time.  Skin: Skin is warm and dry.  Psychiatric: She has a normal mood and affect. Her behavior is normal.       Assessment & Plan:  Hypothyroidism- due to recheck level.  Last level was 8. If it comes back elevated again then we may need to adjust her regimen.  Recurrent UTI - will refill Macrobid. Follow up in one year  Herpes simplex-refilled valacyclovir. Follow up in one year.  She declines flu vaccine.  She says she actually has scheduled her mammogram for couple weeks from now.

## 2016-11-28 LAB — TSH: TSH: 2.58 mIU/L

## 2016-11-30 NOTE — Progress Notes (Signed)
All labs are normal. 

## 2016-12-10 ENCOUNTER — Telehealth: Payer: Self-pay | Admitting: *Deleted

## 2016-12-10 NOTE — Telephone Encounter (Signed)
Pt lvm asking about having add testing done. She stated that she forgot to mention this at her last ov. She wanted to know if she could come by and fill out the questionnaire for this prior to being seen.[name], [name] [name]    Called pt back and informed her that I could give her the form to answer however, she still would need to be seen and then Dr. Linford ArnoldMetheney would also have to seen her for a formal evaluation. Pt asked if the questionnaire can be mailed to her home. I told her that I would mail this to her.[name], [name] [place]

## 2016-12-11 ENCOUNTER — Other Ambulatory Visit (HOSPITAL_COMMUNITY): Payer: Self-pay | Admitting: Psychiatry

## 2016-12-28 ENCOUNTER — Telehealth: Payer: Self-pay | Admitting: Family Medicine

## 2016-12-28 NOTE — Telephone Encounter (Signed)
Please call patient: I did review her screening tool for ADD. She does have a significant number of the symptoms it may be worth having her formally tested and evaluated if she would like to do so I'll be happy to place a referral. Please see scanned document.  Nani Gasseratherine Metheney, MD

## 2017-01-01 ENCOUNTER — Ambulatory Visit (INDEPENDENT_AMBULATORY_CARE_PROVIDER_SITE_OTHER): Payer: 59

## 2017-01-01 ENCOUNTER — Encounter (HOSPITAL_COMMUNITY): Payer: Self-pay | Admitting: Psychiatry

## 2017-01-01 ENCOUNTER — Ambulatory Visit (INDEPENDENT_AMBULATORY_CARE_PROVIDER_SITE_OTHER): Payer: 59 | Admitting: Psychiatry

## 2017-01-01 ENCOUNTER — Telehealth: Payer: Self-pay | Admitting: Family Medicine

## 2017-01-01 ENCOUNTER — Telehealth (HOSPITAL_COMMUNITY): Payer: Self-pay | Admitting: Psychiatry

## 2017-01-01 DIAGNOSIS — F0630 Mood disorder due to known physiological condition, unspecified: Secondary | ICD-10-CM

## 2017-01-01 DIAGNOSIS — Z811 Family history of alcohol abuse and dependence: Secondary | ICD-10-CM

## 2017-01-01 DIAGNOSIS — Z882 Allergy status to sulfonamides status: Secondary | ICD-10-CM

## 2017-01-01 DIAGNOSIS — Z79899 Other long term (current) drug therapy: Secondary | ICD-10-CM

## 2017-01-01 DIAGNOSIS — Z818 Family history of other mental and behavioral disorders: Secondary | ICD-10-CM

## 2017-01-01 DIAGNOSIS — Z79891 Long term (current) use of opiate analgesic: Secondary | ICD-10-CM

## 2017-01-01 DIAGNOSIS — Z1231 Encounter for screening mammogram for malignant neoplasm of breast: Secondary | ICD-10-CM

## 2017-01-01 DIAGNOSIS — Z813 Family history of other psychoactive substance abuse and dependence: Secondary | ICD-10-CM

## 2017-01-01 DIAGNOSIS — F331 Major depressive disorder, recurrent, moderate: Secondary | ICD-10-CM

## 2017-01-01 DIAGNOSIS — Z8249 Family history of ischemic heart disease and other diseases of the circulatory system: Secondary | ICD-10-CM

## 2017-01-01 DIAGNOSIS — Z888 Allergy status to other drugs, medicaments and biological substances status: Secondary | ICD-10-CM

## 2017-01-01 DIAGNOSIS — R41840 Attention and concentration deficit: Secondary | ICD-10-CM

## 2017-01-01 DIAGNOSIS — F411 Generalized anxiety disorder: Secondary | ICD-10-CM

## 2017-01-01 MED ORDER — BUSPIRONE HCL 10 MG PO TABS: 10.0000 mg | ORAL_TABLET | Freq: Two times a day (BID) | ORAL | 1 refills | Status: DC

## 2017-01-01 MED ORDER — LAMOTRIGINE 25 MG PO TABS: 75.0000 mg | ORAL_TABLET | Freq: Every day | ORAL | 1 refills | Status: DC

## 2017-01-01 NOTE — Telephone Encounter (Signed)
lvm informing pt of recommendations advised her to rtn call if ok with referral.Guerline Happ, Viann Shoveonya [name]

## 2017-01-01 NOTE — Telephone Encounter (Signed)
[facility].Laureen Ochs.Vila Dory, Viann Shoveonya [name]

## 2017-01-01 NOTE — Telephone Encounter (Signed)
We have decreased buspar today to 10mg  bid. Should not decrease two meds together. Call back in one month

## 2017-01-01 NOTE — Telephone Encounter (Signed)
Informed patient of recommendations by dr Gilmore Larocheakhtar. Patient showed understanding, nothing further needed at this time.

## 2017-01-01 NOTE — Telephone Encounter (Signed)
Pt called after appointment and states she is taking lamictal 25mg  in the PM.  Patients states she wants to decrease this.   Please advise.

## 2017-01-01 NOTE — Progress Notes (Signed)
Patient ID: Sharon AmbleNancy Greene, female   DOB: [birth year], 61 y.o.   MRN: [id number]   [facility] Follow-up Outpatient Visit  Sharon Ambleancy Broda [birth year]  Date: 01/01/2017  Chief Complaint:  Follow Up. Depression and anxiety  History of Chief Complaint:   HPI Comments: [name] is a 61 y/o female with a past psychiatric history significant for Major Depression, Recurrent severe, generalized anxiety and bereavement. The patient returns  for psychiatric services and medication management.  Her depression dates back to 2008 when she lost her mom and also her brother was using drugs.   Patient is doing reasonable she has stopped taking Cymbalta since that she wants to be on lower medication dosages as well. She gets injections shorts for the shoulder. Depression [place] is doing reasonable anxiety is not worsened mood disorder she is on Lamictal 75 mg there is no rash Grief: not worsened  Anxiety: related to stress and anniversaries.  . Severity: Depression: has not worsened : 7/10 (0=Very depressed; 5=Neutral; 10=Very Happy)  Anxiety- 4/10 (0=no anxiety; 5= moderate/tolerable anxiety; 10= panic attacks) (somewhat worsened)  . Duration: The patient notes depression since her early 5630's; and worsened since 2008 with the death of her parents.   . Timing: variable . Context: Being late and variable factors related to anniversaries and stress times.visited [place] where her mom used to live. Had some closure of grief.   . Modifying factors: Mood improves with exercise. No psychotic symptoms or mania  Review of Systems  Constitutional: Negative for fever.  Cardiovascular: Negative for chest pain and palpitations.  Gastrointestinal: Negative for nausea.  Skin: Negative for rash.  Psychiatric/Behavioral: Negative for substance abuse.     There were no vitals filed for this visit. Physical Exam  Vitals reviewed.  Constitutional: She appears well-developed and well-nourished. No  distress.  Skin: She is not diaphoretic.     Past Medical History: Reviewed Past Medical History:  Diagnosis Date  . Anxiety   . Arthritis    Right Hip  . Breast disorder     Rt Breast Calcifications  . Chronic cystitis   . Depression   . Dyspareunia   . Hypercholesterolemia   . Hypothyroidism   . Plantar fasciitis of left foot    2013  . Torn meniscus 11/13/15   Rt knee   Allergies: Reviewed Allergies  Allergen Reactions  . Bupropion Hcl Other (See Comments)    irritabillity  . Sulfa Antibiotics Itching   Current Medications: Reviewed Current Outpatient Prescriptions on File Prior to Visit  Medication Sig Dispense Refill  . calcium-vitamin D (OSCAL WITH D) 500-200 MG-UNIT per tablet Take 2 tablets by mouth daily.     . cetirizine (ZYRTEC) 5 MG tablet Take 10 mg by mouth daily.    . clonazePAM (KLONOPIN) 0.5 MG tablet TAKE 1 TABLET BY MOUTH DAILY AS NEEDED 90 tablet 0  . levothyroxine (SYNTHROID, LEVOTHROID) 88 MCG tablet TAKE 1 TABLET (88 MCG TOTAL) BY MOUTH DAILY BEFORE BREAKFAST. 90 tablet 0  . Multiple Vitamin (MULTIVITAMIN) tablet Take 1 tablet by mouth daily.      . nitrofurantoin (MACRODANTIN) 50 MG capsule Take 1 capsule (50 mg total) by mouth daily as needed. 30 capsule 0  . oxyCODONE-acetaminophen (PERCOCET) 10-325 MG tablet     . valACYclovir (VALTREX) 500 MG tablet Take 1 tablet (500 mg total) by mouth daily as needed. 90 tablet 1  . [DISCONTINUED] DULoxetine (CYMBALTA) 30 MG capsule Take 1 capsule (30 mg total) by mouth daily. 90 capsule  3   No current facility-administered medications on file prior to visit.      Substance Abuse History in the last 12 months: Reviewed Social History  Substance Use Topics  . Smoking status: Former Smoker    Quit date: 09/20/1982  . Smokeless tobacco: Never Used  . Alcohol use 0.0 oz/week     Comment: Use is 1-2 a month.  Caffeine: 2 cups a day   Family History: Reviewed Family History   Problem  Relation  Age of  Onset   .  COPD     .  Aneurysm     .  Anxiety disorder  Mother    .  Depression  Mother    .  Alcohol abuse  Father    .  Heart murmur  Father    .  Drug abuse  Brother    .  Anxiety disorder  Brother    .  Depression  Brother    .  Dementia  Maternal Grandfather     Psychiatric Specialty Exam:  Objective: Appearance: Casual and Well Groomed   Eye Contact:: Good   Speech: Clear and Coherent and Normal Rate   Volume: Normal   Mood:  fair   Affect: Congruent   Thought Process: Coherent, Logical and Loose   Orientation: Full   Thought Content: WDL   Suicidal Thoughts: No   Homicidal Thoughts: No   Judgement: Fair   Insight: Fair   Psychomotor Activity: Normal   Akathisia: No   Handed: Left   Memory: Immediate 3/3, recent: 1/3   . Assets: Communication Skills  Desire for Improvement  Financial Resources/Insurance  Housing  Leisure Time  Physical Health  Resilience  Transportation  Vocational/Educational    Laboratory/X-Ray  Psychological Evaluation(s)   NONE  NONE    Assessment:  AXIS I  Major Depression, Recurrent severe- (worsening). Generalized anxiety disorder. bereavement  AXIS II  No diagnosis   AXIS III  Past Medical History    Diagnosis  Date    .  Hypercholesterolemia     .  Arthritis       Right Hip    .  Hypothyroidism     .  Chronic cystitis     .  Decreased libido     .  Dyspareunia     .  Breast disorder       Rt Breast Calcifications    .  Depression     .  Anxiety     .  Plantar fasciitis of left foot       2013      AXIS IV  other psychosocial or environmental problems   AXIS V  64   Treatment Plan/Recommendations:   Plan of Care:  PLAN:  1. Affirm with the patient that the medications are taken as ordered. Patient  expressed understanding of how their medications were to be used.    Laboratory:    Psychotherapy: Therapy: brief supportive therapy provided.  Discussed psychosocial stressors in detail. More than 50% of the visit  was spent on individual therapy/counseling.   Medications:  Continue  the following psychiatric medications as written prior to this appointment with the following changes:: continue following  1. Depression not worsened. Continue lamictal 75mg  2. Anxiety disorder; buspar cut down to 10mg  bid 3. Grief. Not worsened  Prescriptions printed.  She is not on cymbalta.     Consultations: follow with primary care  Safety Concerns:   Patient told to call clinic if  any problems occur. Patient advised to go to  ER  if she should develop SI/HI, side effects, or if symptoms worsen. Has crisis numbers to call if needed.     Follow up in 2-3 months  Time spent: 25 minutes.     Thresa Ross, MD

## 2017-01-01 NOTE — Telephone Encounter (Signed)
[name] wanted to know her next steps to take after her ADD test was turned in. Please give her a call with the information needed in order for her to be prepared for the next steps in her process.

## 2017-01-22 ENCOUNTER — Telehealth (HOSPITAL_COMMUNITY): Payer: Self-pay | Admitting: *Deleted

## 2017-01-31 NOTE — Telephone Encounter (Signed)
Medication refill- [facility]. Per Dr. Gilmore LarocheAkhtar, refill request is denied. Pt received printed Rx for Buspar 10mg , #60 on 01/01/17 w/ 1 refill. Nothing further is needed at this time.

## 2017-02-05 ENCOUNTER — Encounter: Payer: Self-pay | Admitting: Nurse Practitioner

## 2017-02-05 ENCOUNTER — Encounter: Payer: 59 | Admitting: Nurse Practitioner

## 2017-02-05 NOTE — Progress Notes (Signed)
Pre-visit discussion using our clinic review tool. No additional management support is needed unless otherwise documented below in the visit note.  

## 2017-02-08 ENCOUNTER — Encounter: Payer: Self-pay | Admitting: Obstetrics & Gynecology

## 2017-02-08 ENCOUNTER — Telehealth: Payer: Self-pay | Admitting: Family Medicine

## 2017-02-08 ENCOUNTER — Ambulatory Visit (INDEPENDENT_AMBULATORY_CARE_PROVIDER_SITE_OTHER): Payer: 59 | Admitting: Obstetrics & Gynecology

## 2017-02-08 VITALS — BP 109/66 | HR 67 | Resp 16 | Ht 68.0 in | Wt 148.0 lb

## 2017-02-08 DIAGNOSIS — Z01411 Encounter for gynecological examination (general) (routine) with abnormal findings: Secondary | ICD-10-CM

## 2017-02-08 DIAGNOSIS — Z01419 Encounter for gynecological examination (general) (routine) without abnormal findings: Secondary | ICD-10-CM

## 2017-02-08 DIAGNOSIS — Z0000 Encounter for general adult medical examination without abnormal findings: Secondary | ICD-10-CM

## 2017-02-08 DIAGNOSIS — N952 Postmenopausal atrophic vaginitis: Secondary | ICD-10-CM

## 2017-02-08 DIAGNOSIS — R8290 Unspecified abnormal findings in urine: Secondary | ICD-10-CM

## 2017-02-08 DIAGNOSIS — R41840 Attention and concentration deficit: Secondary | ICD-10-CM

## 2017-02-08 LAB — POCT URINALYSIS DIPSTICK
BILIRUBIN UA: NEGATIVE
GLUCOSE UA: NEGATIVE
Ketones, UA: NEGATIVE
Leukocytes, UA: NEGATIVE
Nitrite, UA: NEGATIVE
Protein, UA: NEGATIVE
RBC UA: NEGATIVE
SPEC GRAV UA: 1.01
Urobilinogen, UA: 0.2
pH, UA: 5

## 2017-02-08 MED ORDER — ESTRADIOL 0.1 MG/GM VA CREA: 1.0000 | TOPICAL_CREAM | VAGINAL | 12 refills | Status: DC

## 2017-02-08 NOTE — Telephone Encounter (Signed)
Patient called in needing more information about her [facility]. She stated that she went to a [place] office on Friday but the provider she saw seems to only do primary care. She also was unsure of why [facility] could not [facility] in our building. Please advise. Thanks!

## 2017-02-08 NOTE — Addendum Note (Signed)
Addended by: Granville LewisLARK, Aurielle Slingerland L on: 02/08/2017 05:01 PM   Modules accepted: Orders

## 2017-02-08 NOTE — Progress Notes (Signed)
Subjective:    [name] is a 61 y.o. female who presents for an annual exam. The patient has c/o vaginal dryness and pain. The patient is not currentl sexually active due to vaginal atrophy. GYN screening history: last pap: was normal. The patient wears seatbelts: yes. The patient participates in regular exercise: yes.  Menstrual History: OB History    Gravida Para Term Preterm AB Living   0             SAB TAB Ectopic Multiple Live Births                  No LMP recorded. Patient is postmenopausal.    The following portions of the patient's history were reviewed and updated as appropriate: allergies, current medications, past family history, past medical history, past social history, past surgical history and problem list.  Review of Systems Pertinent items noted in HPI and remainder of comprehensive ROS otherwise negative.    Objective:      Vitals:   02/08/17 1320  BP: 109/66  Pulse: 67  Resp: 16  Weight: 148 lb (67.1 kg)  Height: 5\' 8"  (1.727 m)   Vitals:  WNL General appearance: alert, cooperative and no distress  HEENT: Normocephalic, without obvious abnormality, atraumatic Eyes: negative Throat: lips, mucosa, and tongue normal; teeth and gums normal  Respiratory: Clear to auscultation bilaterally  CV: Regular rate and rhythm  Breasts:  Normal appearance, no masses or tenderness, no nipple retraction or dimpling  GI: Soft, non-tender; bowel sounds normal; no masses,  no organomegaly  GU: External Genitalia:  Tanner V, no lesion Urethra:  No prolapse   Vagina: Atrophic with inflammation  Cervix: No CMT, no lesion--difficult to view secondary to pain from speculum exam.  Uterus:  Normal size and contour, non tender  Adnexa: Normal, no masses, non tender  Musculoskeletal: No edema, redness or tenderness in the calves or thighs  Skin: No lesions or rash  Lymphatic: Axillary adenopathy: none     Psychiatric: Normal mood and behavior    .    Assessment:     Healthy female exam.   Atrophic vaginitis   Plan:   Mammogram Pap with cotesting in 2 yeats Pt couldn't afford Rx for Estrace last year.  Gave samples and will call in estradiol which is cheaper. Colonoscopy in 3 years per patient Continue exercising.

## 2017-02-09 NOTE — Progress Notes (Signed)
This encounter was created in error - please disregard.

## 2017-02-10 LAB — URINE CULTURE: ORGANISM ID, BACTERIA: NO GROWTH

## 2017-02-15 NOTE — Telephone Encounter (Signed)
Referral placed.[name]  

## 2017-02-16 ENCOUNTER — Telehealth (HOSPITAL_COMMUNITY): Payer: Self-pay | Admitting: *Deleted

## 2017-02-16 NOTE — Telephone Encounter (Signed)
Received [facility] from [facility] for a 90 day order for Lamictal. Per pharmacy, pt will need a 90 day prescription for insurance to cover the cost. Pt was seen on 01/01/17. Please advise.

## 2017-02-16 NOTE — Telephone Encounter (Signed)
To determine at next visit.

## 2017-02-16 NOTE — Telephone Encounter (Signed)
Response faxed to [facility] at [phone]. Pt's next apt is schedule on 03/12/17.

## 2017-02-19 ENCOUNTER — Encounter: Payer: Self-pay | Admitting: Family Medicine

## 2017-02-19 ENCOUNTER — Ambulatory Visit (INDEPENDENT_AMBULATORY_CARE_PROVIDER_SITE_OTHER): Payer: 59 | Admitting: Family Medicine

## 2017-02-19 VITALS — BP 107/63 | HR 69 | Wt 146.0 lb

## 2017-02-19 DIAGNOSIS — G8929 Other chronic pain: Secondary | ICD-10-CM

## 2017-02-19 DIAGNOSIS — M545 Low back pain, unspecified: Secondary | ICD-10-CM

## 2017-02-19 DIAGNOSIS — M47816 Spondylosis without myelopathy or radiculopathy, lumbar region: Secondary | ICD-10-CM | POA: Diagnosis not present

## 2017-02-19 DIAGNOSIS — F332 Major depressive disorder, recurrent severe without psychotic features: Secondary | ICD-10-CM | POA: Diagnosis not present

## 2017-02-19 DIAGNOSIS — F411 Generalized anxiety disorder: Secondary | ICD-10-CM | POA: Diagnosis not present

## 2017-02-19 MED ORDER — DULOXETINE HCL 30 MG PO CPEP: 30.0000 mg | ORAL_CAPSULE | Freq: Every day | ORAL | 1 refills | Status: DC

## 2017-02-19 NOTE — Progress Notes (Signed)
[name] is a 61 y.o. female who presents to [facility] today for chronic back pain. Patient is a long history of chronic back pain. She's had multiple different interventions and is currently being seen at comprehensive pain specialists. She's had what sounds like facet injections in preparation for an ablation. She notes this is been moderately helpful. Additionally she is currently taking oxycodone 3 times a day which helps is also moderately helpful. She notes that she feels overwhelmed and hopeless about her chronic pain. She feels as though she'll never have a day where she does not have sometimes overwhelming pain. Her back pain is felt predominantly in her lower back and does not usually radiate. She notes that prolonged sitting or prolonged standing in place tendon make her pain worse. The pain is felt bandlike across her lumbar spine. She is a pertinent past surgical history for rod placement in her thoracic spine to correct scoliosis in the mid 80s.   Past Medical History:  Diagnosis Date  . Anxiety   . Arthritis    Right Hip  . Breast disorder     Rt Breast Calcifications  . Chronic cystitis   . Depression   . Dyspareunia   . Hypercholesterolemia   . Hypothyroidism   . Plantar fasciitis of left foot    2013  . Torn meniscus 11/13/15   Rt knee   Past Surgical History:  Procedure Laterality Date  . BREAST ENHANCEMENT SURGERY    . KNEE SURGERY Right 11/16  . plastic surgry to eyes    . Scoliosis repair with Harrington Rods     Social History  Substance Use Topics  . Smoking status: Former Smoker    Quit date: 09/20/1982  . Smokeless tobacco: Never Used  . Alcohol use 0.0 oz/week     Comment: Use is 1-2 a month.     ROS:  As above   Medications: Current Outpatient Prescriptions  Medication Sig Dispense Refill  . busPIRone (BUSPAR) 10 MG tablet Take 1 tablet (10 mg total) by mouth 2 (two) times daily. 60 tablet 1   . calcium-vitamin D (OSCAL WITH D) 500-200 MG-UNIT per tablet Take 2 tablets by mouth daily.     . clonazePAM (KLONOPIN) 0.5 MG tablet TAKE 1 TABLET BY MOUTH DAILY AS NEEDED 90 tablet 0  . estradiol (ESTRACE VAGINAL) 0.1 MG/GM vaginal cream Place 1 Applicatorful vaginally 3 (three) times a week. 42.5 g 12  . FLUoxetine (PROZAC) 40 MG capsule Take 40 mg by mouth daily.    Marland Kitchen lamoTRIgine (LAMICTAL) 25 MG tablet Take 3 tablets (75 mg total) by mouth daily. Take 3 a day 90 tablet 1  . levothyroxine (SYNTHROID, LEVOTHROID) 88 MCG tablet TAKE 1 TABLET (88 MCG TOTAL) BY MOUTH DAILY BEFORE BREAKFAST. 90 tablet 0  . Multiple Vitamin (MULTIVITAMIN) tablet Take 1 tablet by mouth daily.      . nitrofurantoin (MACRODANTIN) 50 MG capsule Take 1 capsule (50 mg total) by mouth daily as needed. 30 capsule 0  . oxyCODONE-acetaminophen (PERCOCET) 10-325 MG tablet     . phenylephrine (NASAL DECONGESTANT PE) 10 MG TABS tablet Take 10 mg by mouth every 4 (four) hours as needed.    . valACYclovir (VALTREX) 500 MG tablet Take 1 tablet (500 mg total) by mouth daily as needed. 90 tablet 1  . DULoxetine (CYMBALTA) 30 MG capsule Take 1 capsule (30 mg total) by mouth daily. 30 capsule 1   No current facility-administered medications for  this visit.    Allergies  Allergen Reactions  . Bupropion Hcl Other (See Comments)    irritabillity  . Sulfa Antibiotics Itching     Exam:  BP 107/63   Pulse 69   Wt 146 lb (66.2 kg)   BMI 22.20 kg/m  General: Well Developed, well nourished, and in no acute distress.  Neuro/Psych: Alert and oriented x3, extra-ocular muscles intact, able to move all 4 extremities, sensation grossly intact. Skin: Warm and dry, no rashes noted.  Respiratory: Not using accessory muscles, speaking in full sentences, trachea midline.  Cardiovascular: Pulses palpable, no extremity edema. Abdomen: Does not appear distended. MSK: Rotational scoliosis present. L spine is nontender. Decreased motion and  flexibility of the lumbar spine.   Study Result   CLINICAL DATA:  Chronic, worsening low back pain. No known injury. Subsequent encounter.  EXAM: MRI LUMBAR SPINE WITHOUT CONTRAST  TECHNIQUE: Multiplanar, multisequence MR imaging of the lumbar spine was performed. No intravenous contrast was administered.  COMPARISON:  Plain films lumbar spine 05/10/2015.  FINDINGS: The patient has transitional lumbosacral anatomy. The first sacral segment is lumbarized on the right. For purposes of this examination, the lowest level imaged in the axial plane is labeled S1-2.  Vertebral body height is maintained. There is marked convex left scoliosis with the apex at L3. Trace degenerative retrolisthesis L2 on L3 is identified. Alignment is otherwise unremarkable. Degenerative endplate signal changes seen at L2-3 and L5-S1. There is no fracture worrisome marrow lesion. Artifact from fusion hardware of the lower thoracic spine is noted. Imaged intra-abdominal contents are unremarkable.  The T11-12 level is imaged in the sagittal plane only and negative.  T12-L1: Partially obscured by artifact from hardware. No disc bulge or protrusion is identified. The central canal and foramina appear widely patent.  L1-2: There is some facet degenerative disease. No disc bulge or protrusion. The central canal and foramina appear open.  L2-3: Shallow right paracentral disc protrusion extends into the foramen. The disc causes only mild narrowing in the right lateral recess and foramen. The left foramen is widely patent.  L3-4: Shallow disc bulge with ligamentum flavum thickening and mild facet degenerative change. The central canal and foramina are open.  L4-5: Shallow disc bulge and mild ligamentum flavum thickening without central canal or foraminal stenosis. There is facet degenerative change at this level.  L5-S1: Shallow disc bulge and endplate spur eccentrically prominent to the  left. The central canal and right foramen are open. Moderate to moderately severe left foraminal narrowing is identified.  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 Please see numbering scheme above.  Marked convex left scoliosis.  Very mild narrowing in the right lateral recess and foramen at L2-3 due to disc without nerve root compression.  Disc and endplate spur cause moderate to moderately severe left foraminal narrowing at L5-S1.   Electronically Signed   By: Drusilla Kannerhomas  Dalessio M.D.   On: 07/15/2015 11:43       No results found for this or any previous visit (from the past 48 hour(s)). No results found.    Assessment and Plan: 61 y.o. female with lumbar pain. This is multifactorial. I think certainly she's got a component of degenerative facets. Certainly the immobilization of his thoracic spine has not helped her lumbar pain.  She moves stiffly especially about her lumbar spine where she should have more motion. I think she could benefit from some more physical therapy. I think there is room for improvement. Additionally I think it's reasonable  to try Cymbalta as this may help both of her perception of suffering due to poor mood as well as her pain. We'll start titrating down fluoxetine while titrating up Cymbalta. Plan to start Cymbalta 30 mg daily while taking 20 mg of fluoxetine. We'll recheck in about 2 weeks.    Orders Placed This Encounter  Procedures  . Ambulatory referral to Physical Therapy    Referral Priority:   Routine    Referral Type:   Physical Medicine    Referral Reason:   Specialty Services Required    Requested Specialty:   Physical Therapy    Number of Visits Requested:   1    Discussed warning signs or symptoms. Please see discharge instructions. Patient expresses understanding.

## 2017-02-19 NOTE — Patient Instructions (Addendum)
Thank you for coming in today. Attend PT.  Take 1/2 pill of fluoxetine (prozac) daily.  On Monday start Cymbalta.  After 2 weeks STOP prozac.   Recheck in 3-4 weeks.   We may need to authorize cymbalta.     We will try to help you pain.

## 2017-02-24 ENCOUNTER — Telehealth: Payer: Self-pay | Admitting: *Deleted

## 2017-02-24 NOTE — Telephone Encounter (Signed)
Received rejection notice from [facility] pharm stating [facility]. Most likely this is required because of insurance. Are you ok with sending a 90 day supply?

## 2017-02-25 NOTE — Telephone Encounter (Signed)
yes

## 2017-02-26 ENCOUNTER — Other Ambulatory Visit: Payer: Self-pay | Admitting: *Deleted

## 2017-02-26 ENCOUNTER — Other Ambulatory Visit: Payer: Self-pay | Admitting: Family Medicine

## 2017-02-26 MED ORDER — DULOXETINE HCL 30 MG PO CPEP: 30.0000 mg | ORAL_CAPSULE | Freq: Every day | ORAL | 0 refills | Status: DC

## 2017-02-26 NOTE — Telephone Encounter (Signed)
rx sent

## 2017-02-26 NOTE — Progress Notes (Signed)
rx sent

## 2017-03-10 ENCOUNTER — Other Ambulatory Visit: Payer: Self-pay | Admitting: Family Medicine

## 2017-03-12 ENCOUNTER — Ambulatory Visit (INDEPENDENT_AMBULATORY_CARE_PROVIDER_SITE_OTHER): Payer: 59 | Admitting: Psychiatry

## 2017-03-12 ENCOUNTER — Encounter: Payer: Self-pay | Admitting: Family Medicine

## 2017-03-12 ENCOUNTER — Ambulatory Visit: Payer: Self-pay | Admitting: Rehabilitative and Restorative Service Providers"

## 2017-03-12 ENCOUNTER — Ambulatory Visit (INDEPENDENT_AMBULATORY_CARE_PROVIDER_SITE_OTHER): Payer: 59 | Admitting: Family Medicine

## 2017-03-12 VITALS — BP 96/66 | HR 81 | Wt 145.0 lb

## 2017-03-12 DIAGNOSIS — Z888 Allergy status to other drugs, medicaments and biological substances status: Secondary | ICD-10-CM | POA: Diagnosis not present

## 2017-03-12 DIAGNOSIS — Z79899 Other long term (current) drug therapy: Secondary | ICD-10-CM | POA: Diagnosis not present

## 2017-03-12 DIAGNOSIS — M47816 Spondylosis without myelopathy or radiculopathy, lumbar region: Secondary | ICD-10-CM

## 2017-03-12 DIAGNOSIS — Z882 Allergy status to sulfonamides status: Secondary | ICD-10-CM | POA: Diagnosis not present

## 2017-03-12 DIAGNOSIS — F0630 Mood disorder due to known physiological condition, unspecified: Secondary | ICD-10-CM | POA: Diagnosis not present

## 2017-03-12 DIAGNOSIS — Z818 Family history of other mental and behavioral disorders: Secondary | ICD-10-CM

## 2017-03-12 DIAGNOSIS — Z79891 Long term (current) use of opiate analgesic: Secondary | ICD-10-CM

## 2017-03-12 DIAGNOSIS — Z87891 Personal history of nicotine dependence: Secondary | ICD-10-CM

## 2017-03-12 DIAGNOSIS — Z811 Family history of alcohol abuse and dependence: Secondary | ICD-10-CM | POA: Diagnosis not present

## 2017-03-12 DIAGNOSIS — F4321 Adjustment disorder with depressed mood: Secondary | ICD-10-CM | POA: Diagnosis not present

## 2017-03-12 DIAGNOSIS — F331 Major depressive disorder, recurrent, moderate: Secondary | ICD-10-CM

## 2017-03-12 DIAGNOSIS — F411 Generalized anxiety disorder: Secondary | ICD-10-CM

## 2017-03-12 MED ORDER — LAMOTRIGINE 25 MG PO TABS: 50.0000 mg | ORAL_TABLET | Freq: Every day | ORAL | 1 refills | Status: DC

## 2017-03-12 MED ORDER — BUSPIRONE HCL 10 MG PO TABS: 10.0000 mg | ORAL_TABLET | Freq: Two times a day (BID) | ORAL | 1 refills | Status: DC

## 2017-03-12 MED ORDER — FLUOXETINE HCL 20 MG PO CAPS: 20.0000 mg | ORAL_CAPSULE | Freq: Every day | ORAL | 1 refills | Status: DC

## 2017-03-12 NOTE — Progress Notes (Signed)
Patient ID: [name], female   DOB: [birth year], 61 y.o.   MRN: [id number]   [facility] Follow-up Outpatient Visit  [name] [birth year]  Date: 03/12/2017  Chief Complaint:  Follow Up. Depression and anxiety  History of Chief Complaint:   HPI Comments: [name] is a 61 y/o female with a past psychiatric history significant for Major Depression, Recurrent severe, generalized anxiety and bereavement. The patient returns  for psychiatric services and medication management.  Her depression dates back to 2008 when she lost her mom and also her brother was using drugs.  Patient apparently stopped taking Cymbalta that she also stopped taking Prozac that was started last visit. She is not taking Lamictal either now she's feeling down she understands she needs to be back on medication but because it does help she suffers from back condition and is now on oxycodone. She has some crying spells when she talked about a back condition affecting her mood. She can use to work 2 or 3 times a week but is having some difficult relationship with her husband which is distant and there is no intimate life  Anxiety is somewhat worse Relationship is distant  . Modifying factors: Mood improves with exercise. No psychotic symptoms or mania  Review of Systems  Constitutional: Negative for fever.  Cardiovascular: Negative for chest pain and palpitations.  Gastrointestinal: Negative for nausea.  Skin: Negative for rash.  Psychiatric/Behavioral: Positive for depression. Negative for substance abuse.     Vitals:   03/12/17 1234  BP: 96/66  Pulse: 81  Weight: 145 lb (65.8 kg)   Physical Exam  Vitals reviewed.  Constitutional: She appears well-developed and well-nourished. No distress.  Skin: She is not diaphoretic.     Past Medical History: Reviewed Past Medical History:  Diagnosis Date  . Anxiety   . Arthritis    Right Hip  . Breast disorder     Rt Breast Calcifications   . Chronic cystitis   . Depression   . Dyspareunia   . Hypercholesterolemia   . Hypothyroidism   . Plantar fasciitis of left foot    2013  . Torn meniscus 11/13/15   Rt knee   Allergies: Reviewed Allergies  Allergen Reactions  . Bupropion Hcl Other (See Comments)    irritabillity  . Sulfa Antibiotics Itching   Current Medications: Reviewed Current Outpatient Prescriptions on File Prior to Visit  Medication Sig Dispense Refill  . calcium-vitamin D (OSCAL WITH D) 500-200 MG-UNIT per tablet Take 2 tablets by mouth daily.     . clonazePAM (KLONOPIN) 0.5 MG tablet TAKE 1 TABLET BY MOUTH EVERY DAY AS NEEDED 90 tablet 0  . estradiol (ESTRACE VAGINAL) 0.1 MG/GM vaginal cream Place 1 Applicatorful vaginally 3 (three) times a week. 42.5 g 12  . levothyroxine (SYNTHROID, LEVOTHROID) 88 MCG tablet TAKE 1 TABLET (88 MCG TOTAL) BY MOUTH DAILY BEFORE BREAKFAST. 90 tablet 2  . Multiple Vitamin (MULTIVITAMIN) tablet Take 1 tablet by mouth daily.      . nitrofurantoin (MACRODANTIN) 50 MG capsule Take 1 capsule (50 mg total) by mouth daily as needed. 30 capsule 0  . oxyCODONE-acetaminophen (PERCOCET) 10-325 MG tablet     . phenylephrine (NASAL DECONGESTANT PE) 10 MG TABS tablet Take 10 mg by mouth every 4 (four) hours as needed.    . valACYclovir (VALTREX) 500 MG tablet Take 1 tablet (500 mg total) by mouth daily as needed. 90 tablet 1   No current facility-administered medications on file prior to visit.  Substance Abuse History in the last 12 months: Reviewed Social History  Substance Use Topics  . Smoking status: Former Smoker    Quit date: 09/20/1982  . Smokeless tobacco: Never Used  . Alcohol use 0.0 oz/week     Comment: Use is 1-2 a month.  Caffeine: 2 cups a day   Family History: Reviewed Family History   Problem  Relation  Age of Onset   .  COPD     .  Aneurysm     .  Anxiety disorder  Mother    .  Depression  Mother    .  Alcohol abuse  Father    .  Heart murmur  Father     .  Drug abuse  Brother    .  Anxiety disorder  Brother    .  Depression  Brother    .  Dementia  Maternal Grandfather     Psychiatric Specialty Exam:  Objective: Appearance: Casual and Well Groomed   Eye Contact:: Good   Speech: Clear and Coherent and Normal Rate   Volume: Normal   Mood:  depressed   Affect: congruent  Thought Process: Coherent, Logical and Loose   Orientation: Full   Thought Content: WDL   Suicidal Thoughts: No   Homicidal Thoughts: No   Judgement: Fair   Insight: Fair   Psychomotor Activity: Normal   Akathisia: No   Handed: Left   Memory: Immediate 3/3, recent: 1/3   . Assets: Communication Skills  Desire for Improvement  Financial Resources/Insurance  Housing  Leisure Time  Physical Health  Resilience  Transportation  Vocational/Educational    Laboratory/X-Ray  Psychological Evaluation(s)   NONE  NONE    Assessment:  AXIS I  Major Depression, Recurrent severe- (worsening). Generalized anxiety disorder. bereavement  AXIS II  No diagnosis   AXIS III  Past Medical History    Diagnosis  Date    .  Hypercholesterolemia     .  Arthritis       Right Hip    .  Hypothyroidism     .  Chronic cystitis     .  Decreased libido     .  Dyspareunia     .  Breast disorder       Rt Breast Calcifications    .  Depression     .  Anxiety     .  Plantar fasciitis of left foot       2013      AXIS IV  other psychosocial or environmental problems   AXIS V  64   Treatment Plan/Recommendations:   Plan of Care:  PLAN:  1. Affirm with the patient that the medications are taken as ordered. Patient  expressed understanding of how their medications were to be used.    Laboratory:    Psychotherapy: Therapy: brief supportive therapy provided.  Discussed psychosocial stressors in detail. More than 50% of the visit was spent on individual therapy/counseling.   Medications:  Continue  the following psychiatric medications as written prior to this appointment  with the following changes:: continue following  1. Depression: worsened. Reinstate prozac can keep lower dose of 20mg  . Reinstate lamictal 25mg  increase to 50g in one week.  2. Anxiety disorder; fluctuates. Continue buspar. Re instate prozac No  Side effects when she was taking. Says she just at times stop taking them. Discussed complilance   3. Grief. Not worsened   [place] were therapy in regarding to relationship she needs to  get back in counseling or mental counseling and she agrees follow-up in 2 months or earlier if needed             Thresa Ross, MD

## 2017-03-12 NOTE — Patient Instructions (Signed)
Thank you for coming in today.  Please attend PT.  Talk to Dr Cherylann BanasMeloy about Suboxone.  Recheck with me in 6-8.   [facility]

## 2017-03-12 NOTE — Progress Notes (Signed)
[name] is a 61 y.o. female who [facility] today for follow-up chronic back pain. Patient was seen a few weeks ago for chronic back pain. At that time she was started on Cymbalta and referred to physical therapy. She has not done either in her back continues to hurt about the same. She is reluctant to proceed with physical therapy because she had a trial of that several years ago for a few sessions. She notes that it didn't work then. Additionally she's reluctant to do. Cymbalta because Prozac is working well to control her depression and she's tried 30 mg of Cymbalta in the past and it didn't work very well that either. She notes chronic low back pain worse with activity better with rest. She takes oxycodone for chronic pain managed with pain management.   Past Medical History:  Diagnosis Date  . Anxiety   . Arthritis    Right Hip  . Breast disorder     Rt Breast Calcifications  . Chronic cystitis   . Depression   . Dyspareunia   . Hypercholesterolemia   . Hypothyroidism   . Plantar fasciitis of left foot    2013  . Torn meniscus 11/13/15   Rt knee   Past Surgical History:  Procedure Laterality Date  . BREAST ENHANCEMENT SURGERY    . KNEE SURGERY Right 11/16  . plastic surgry to eyes    . Scoliosis repair with Harrington Rods     Social History  Substance Use Topics  . Smoking status: Former Smoker    Quit date: 09/20/1982  . Smokeless tobacco: Never Used  . Alcohol use 0.0 oz/week     Comment: Use is 1-2 a month.     ROS:  As above   Medications: Current Outpatient Prescriptions  Medication Sig Dispense Refill  . calcium-vitamin D (OSCAL WITH D) 500-200 MG-UNIT per tablet Take 2 tablets by mouth daily.     . clonazePAM (KLONOPIN) 0.5 MG tablet TAKE 1 TABLET BY MOUTH EVERY DAY AS NEEDED 90 tablet 0  . estradiol (ESTRACE VAGINAL) 0.1 MG/GM vaginal cream Place 1 Applicatorful vaginally 3 (three) times a  week. 42.5 g 12  . levothyroxine (SYNTHROID, LEVOTHROID) 88 MCG tablet TAKE 1 TABLET (88 MCG TOTAL) BY MOUTH DAILY BEFORE BREAKFAST. 90 tablet 2  . Multiple Vitamin (MULTIVITAMIN) tablet Take 1 tablet by mouth daily.      . nitrofurantoin (MACRODANTIN) 50 MG capsule Take 1 capsule (50 mg total) by mouth daily as needed. 30 capsule 0  . oxyCODONE-acetaminophen (PERCOCET) 10-325 MG tablet     . phenylephrine (NASAL DECONGESTANT PE) 10 MG TABS tablet Take 10 mg by mouth every 4 (four) hours as needed.    . valACYclovir (VALTREX) 500 MG tablet Take 1 tablet (500 mg total) by mouth daily as needed. 90 tablet 1  . busPIRone (BUSPAR) 10 MG tablet Take 1 tablet (10 mg total) by mouth 2 (two) times daily. 60 tablet 1  . FLUoxetine (PROZAC) 20 MG capsule Take 1 capsule (20 mg total) by mouth daily. 30 capsule 1  . lamoTRIgine (LAMICTAL) 25 MG tablet Take 2 tablets (50 mg total) by mouth daily. Start with one a day for 5 days then two tablets a day 60 tablet 1   No current facility-administered medications for this visit.    Allergies  Allergen Reactions  . Bupropion Hcl Other (See Comments)    irritabillity  . Sulfa Antibiotics Itching  Exam:  BP 96/66   Pulse 81   Wt 145 lb (65.8 kg)   BMI 22.05 kg/m  General: Well Developed, well nourished, and in no acute distress.  Neuro/Psych: Alert and oriented x3, extra-ocular muscles intact, able to move all 4 extremities, sensation grossly intact. Skin: Warm and dry, no rashes noted.  Respiratory: Not using accessory muscles, speaking in full sentences, trachea midline.  Cardiovascular: Pulses palpable, no extremity edema. Abdomen: Does not appear distended. MSK: L-spine nontender decreased motion    No results found for this or any previous visit (from the past 48 hour(s)). No results found.    Assessment and Plan: 61 y.o. female with chronic back pain. We had a long talk about physical therapy. I explained that for visits with physical  therapy over a 2 or 3 week. Really isn't an adequate trial. He also explained goals. I explained that physical therapy is not going to cure her back pain but may decrease it from a 8 out of 10 to a 5 out of 10. She thinks that's a reasonable goal and is eager to try some physical therapy. Patient will call and schedule.  Cymbalta: I think it's reasonable to hold off on Cymbalta for now. Continue Prozac. Ultimately she may benefit from high-dose Cymbalta but will continue to follow with do one thing at a time.  Recheck in 8 weeks or so after trial of physical therapy.    No orders of the defined types were placed in this encounter.   Discussed warning signs or symptoms. Please see discharge instructions. Patient expresses understanding.

## 2017-03-22 ENCOUNTER — Ambulatory Visit (INDEPENDENT_AMBULATORY_CARE_PROVIDER_SITE_OTHER): Payer: 59 | Admitting: Physical Therapy

## 2017-03-22 ENCOUNTER — Encounter: Payer: Self-pay | Admitting: Physical Therapy

## 2017-03-22 DIAGNOSIS — M62830 Muscle spasm of back: Secondary | ICD-10-CM

## 2017-03-22 DIAGNOSIS — M545 Low back pain, unspecified: Secondary | ICD-10-CM

## 2017-03-22 DIAGNOSIS — G8929 Other chronic pain: Secondary | ICD-10-CM | POA: Diagnosis not present

## 2017-03-22 DIAGNOSIS — M6281 Muscle weakness (generalized): Secondary | ICD-10-CM

## 2017-03-22 NOTE — Patient Instructions (Addendum)
Leg Lift: One-Leg    Press pelvis down. Keep knee straight; lengthen and lift one leg (from waist). Do not twist body. Keep other leg down. Hold _1-2__ seconds. Relax. Repeat 10 times. Repeat with other leg. Once a day.     Perform pelvic press first, bend one knee, keeping pelvis level, do not twist body. Lift thigh up, repeat 10 times, once a day.   Unilateral Isometric Hip Flexion    Tighten stomach and raise right knee to outstretched arm. Push gently, keeping arm straight, trunk rigid. Hold __5__ seconds. Repeat _10___ times per set. Do _1___ sets per session. Do __1__ sessions per day. Repeat on the other leg.    Single Leg Raise    Lie on back, one leg bent, other leg straight on mat. Inhale, raising straight leg toward ceiling. Keep hips on mat. Exhale, lowering leg to mat. Repeat _10___ times. Repeat with other leg. Do __1__ sessions per day. Copyright  [employer]. All rights reserved.

## 2017-03-22 NOTE — Therapy (Signed)
[facility] [address] [place], [place], [address] Phone: [phone]   Fax:  [phone]  Physical Therapy Evaluation  Patient Details  Name: [name] MRN: [id number] Date of Birth: 1956/07/27 Referring Provider: Dr Teressa Lower  Encounter Date: 03/22/2017      PT End of Session - 03/22/17 1408    Visit Number 1   Number of Visits 12   Date for PT Re-Evaluation 05/03/17   PT Start Time 1408   PT Stop Time 1453   PT Time Calculation (min) 45 min   Activity Tolerance Patient tolerated treatment well      Past Medical History:  Diagnosis Date  . Anxiety   . Arthritis    Right Hip  . Breast disorder     Rt Breast Calcifications  . Chronic cystitis   . Depression   . Dyspareunia   . Hypercholesterolemia   . Hypothyroidism   . Plantar fasciitis of left foot    2013  . Torn meniscus 11/13/15   Rt knee    Past Surgical History:  Procedure Laterality Date  . BREAST ENHANCEMENT SURGERY    . KNEE SURGERY Right 11/16  . plastic surgry to eyes    . Scoliosis repair with Harrington Rods      There were no vitals filed for this visit.       Subjective Assessment - 03/22/17 1409    Subjective [name] reports she has had back pain since 2008, it has gotten a little better since starting to work with a Systems analyst in Jan 2018.  Started working her arms and legs, now adding in core and balance work and using an incline table ( up to 40 degrees )    Pertinent History scoliosis repair with rod placement yrs ago.    How long can you sit comfortably? a couple hours   How long can you stand comfortably? 45-60 min   How long can you walk comfortably? not limited   Diagnostic tests nothing recently   Patient Stated Goals strengthen her muscles.    Currently in Pain? No/denies  however did take pain med   Pain Location Back   Pain Orientation Lower  across belt line, both sides   Pain Descriptors / Indicators  Dull   Pain Type Chronic pain   Pain Onset More than a month ago   Pain Frequency Intermittent   Aggravating Factors  standing, bending forward, heat   Pain Relieving Factors medication, ice if home             [facility] PT Assessment - 03/22/17 0001      Assessment   Medical Diagnosis Lumbar spondylosis   Referring Provider Dr Teressa Lower   Onset Date/Surgical Date 03/23/07   Hand Dominance Left   Next MD Visit 6 wks   Prior Therapy 2 yrs ago had a couple visits.      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, driver for residents, lift [employer]    Leisure scrap booking     Observation/Other Assessments   Focus on Therapeutic Outcomes (FOTO)  41% limited     Functional Tests   Functional tests Squat     Squat   Comments shifts  to the left due to Rt knee pain     Posture/Postural Control   Posture/Postural Control Postural limitations   Postural Limitations Decreased thoracic kyphosis;Weight shift right  elevated Rt shoulder complex     ROM / Strength   AROM / PROM / Strength AROM;Strength     AROM   AROM Assessment Site Lumbar   Lumbar Flexion to the floor   Lumbar Extension decreased 50%   Lumbar - Right Rotation equal bilat   Lumbar - Left Rotation equal bilat     Strength   Strength Assessment Site Hip;Knee;Ankle;Lumbar   Right/Left Hip Right;Left   Right Hip Flexion 5/5   Right Hip Extension 4/5   Right Hip ABduction 4+/5   Left Hip Flexion 5/5   Left Hip Extension 5/5   Left Hip ABduction 5/5   Right/Left Knee --  bilat WNL   Right/Left Ankle --  bilat WNL   Lumbar Flexion --  TA stronger contraction on the Rt   Lumbar Extension --  multifidi Rt good, Lt fair and delayed contraction     Flexibility   Soft Tissue Assessment /Muscle Length  yes   Hamstrings supine SLR Lt 103 deg, Rt  104 deg   Quadriceps prone knee flex Lt 148 deg, Rt 125 deg     Palpation   Spinal mobility lumbar grade II CPA mobs WNL    Palpation comment tightness and tender in Rt gluts/piriformis.                    [facility] Adult PT Treatment/Exercise - 03/22/17 0001      Exercises   Exercises Lumbar     Lumbar Exercises: Supine   Clam 5 reps  pt didn't feel abdominals working   Straight Leg Raise 10 reps  with TA contraction , each side   Isometric Hip Flexion 10 reps;5 seconds  each side     Lumbar Exercises: Prone   Other Prone Lumbar Exercises 10 reps pelvic press with hip ext, and bent knee hip ext.      Modalities   Modalities --  deferred, pt had another appointment                PT Education - 03/22/17 1453    Education provided Yes   Education Details HEP   Person(s) Educated Patient   Methods Explanation;Demonstration;Handout   Comprehension Returned demonstration;Verbalized understanding             PT Long Term Goals - 03/22/17 1410      PT LONG TERM GOAL #1   Title Patient I in advanced HEP (05/03/17)    Time 6   Period Weeks   Status New     PT LONG TERM GOAL #2   Title report =/> 50% reduction in LBP with walking to not need medication as much ( 05/03/17)    Time 6   Period Weeks   Status New     PT LONG TERM GOAL #3   Title Increased bilat hip strength to 5-/5 (05/03/17)   Time 6   Period Weeks   Status New     PT LONG TERM GOAL #4   Title demo strong equal contraction of the multifidi and TA ( 5/14/18_    Time 6   Period Weeks   Status New     PT LONG TERM GOAL #5   Title Improve FOTO to </= 38% limitation    Period Weeks   Status New  Plan - 03/22/17 1411    Clinical Impression Statement 61 yo female presents for moderate complexity PT eval.  She has a h/o LBP and has agreed to a trial of PT before looking at more invasive procedures. She has difficulty  activating the deep core stabilizers and weakness in her Rt hip.  She has tightness in the Rt gluts/piriformis muscles as well. She stands with lateral shift even with her rods down to L1.  She wishes to progress to working out with her trainier and add in our exercise.    Rehab Potential Good   PT Frequency 2x / week   PT Duration 6 weeks   PT Treatment/Interventions Moist Heat;Ultrasound;Traction;Therapeutic exercise;Dry needling;Manual techniques;Neuromuscular re-education;Cryotherapy;Iontophoresis /ml Dexamethasone;Electrical Stimulation;Patient/family education   PT Next Visit Plan progress deep core stabilization ex.    Consulted and Agree with Plan of Care Patient      Patient will benefit from skilled therapeutic intervention in order to improve the following deficits and impairments:  Decreased strength, Pain, Increased muscle spasms, Improper body mechanics  Visit Diagnosis: Chronic bilateral low back pain without sciatica - Plan: PT plan of care cert/re-cert  Muscle weakness (generalized) - Plan: PT plan of care cert/re-cert  Muscle spasm of back - Plan: PT plan of care cert/re-cert     Problem List Patient Active Problem List   Diagnosis Date Noted  . Abnormal ear sensation 07/28/2016  . Hearing loss 07/28/2016  . Loose stools 07/26/2015  . Trapezius strain 07/26/2015  . Right knee meniscal tear 05/10/2015  . Lumbar spondylosis 05/10/2015  . Postmenopausal atrophic vaginitis 12/18/2014  . COPD, mild (HCC) 03/30/2014  . Generalized anxiety disorder 01/14/2012  . HEARING DEFICIT 05/29/2011  . PANIC DISORDER 08/12/2010  . LOW BACK PAIN, CHRONIC 07/16/2010  . PELVIC FRACTURE 04/15/2010  . DECREASED LIBIDO 06/26/2009  . Major depressive disorder, recurrent episode, severe (HCC) 12/03/2008  . SCOLIOSIS 11/06/2008  . Osteopenia 09/25/2008  . POSTMENOPAUSAL STATUS 09/11/2008  . Hyperlipidemia 05/28/2008  . ARTHRITIS, HIPS, BILATERAL 02/21/2008  . APHTHOUS ULCERS  11/11/2007  . Hypothyroidism 10/05/2007  . Recurrent urinary tract infection 07/18/2007  . SACROILIITIS, RIGHT 07/18/2007    Roderic Scarce PT  03/22/2017, 3:03 PM  [facility] [address] [place], [place], [address] Phone: [phone]   Fax:  [phone]  Name: [name] MRN: [id number] Date of Birth: 1956-10-08

## 2017-03-26 ENCOUNTER — Encounter: Payer: Self-pay | Admitting: Rehabilitative and Restorative Service Providers"

## 2017-03-29 ENCOUNTER — Encounter: Payer: Self-pay | Admitting: Rehabilitative and Restorative Service Providers"

## 2017-04-02 ENCOUNTER — Encounter: Payer: Self-pay | Admitting: Rehabilitative and Restorative Service Providers"

## 2017-04-05 ENCOUNTER — Encounter: Payer: Self-pay | Admitting: Rehabilitative and Restorative Service Providers"

## 2017-04-05 ENCOUNTER — Telehealth: Payer: Self-pay | Admitting: Family Medicine

## 2017-04-05 NOTE — Telephone Encounter (Signed)
Pt called clinic requesting referral to Ortho in [facility] for knee pain. Routing to sports med Provider for review.

## 2017-04-06 NOTE — Telephone Encounter (Signed)
F/u with Dr Luiz Blare. She has been referred there previously. She can call and make an appointment.

## 2017-04-06 NOTE — Telephone Encounter (Signed)
Pt advised. Verbalized understanding.

## 2017-04-09 ENCOUNTER — Ambulatory Visit (INDEPENDENT_AMBULATORY_CARE_PROVIDER_SITE_OTHER): Payer: 59 | Admitting: Rehabilitative and Restorative Service Providers"

## 2017-04-09 DIAGNOSIS — G8929 Other chronic pain: Secondary | ICD-10-CM | POA: Diagnosis not present

## 2017-04-09 DIAGNOSIS — M545 Low back pain, unspecified: Secondary | ICD-10-CM

## 2017-04-09 DIAGNOSIS — M62830 Muscle spasm of back: Secondary | ICD-10-CM | POA: Diagnosis not present

## 2017-04-09 DIAGNOSIS — M6281 Muscle weakness (generalized): Secondary | ICD-10-CM

## 2017-04-09 NOTE — Therapy (Signed)
[facility] [place] [address] [place], [place], [address] Phone: [phone]   Fax:  [phone]  Physical Therapy Treatment  Patient Details  Name: [name] MRN: [id number] Date of Birth: 01/26/1956 Referring Provider: Dr Teressa Lower  Encounter Date: 04/09/2017      PT End of Session - 04/09/17 1400    Visit Number 2   Number of Visits 12   Date for PT Re-Evaluation 05/03/17   PT Start Time 1401   PT Stop Time 1454   PT Time Calculation (min) 53 min   Activity Tolerance Patient tolerated treatment well      Past Medical History:  Diagnosis Date  . Anxiety   . Arthritis    Right Hip  . Breast disorder     Rt Breast Calcifications  . Chronic cystitis   . Depression   . Dyspareunia   . Hypercholesterolemia   . Hypothyroidism   . Plantar fasciitis of left foot    2013  . Torn meniscus 11/13/15   Rt knee    Past Surgical History:  Procedure Laterality Date  . BREAST ENHANCEMENT SURGERY    . KNEE SURGERY Right 11/16  . plastic surgry to eyes    . Scoliosis repair with Harrington Rods      There were no vitals filed for this visit.      Subjective Assessment - 04/09/17 1406    Subjective Jarelyn reports that she has been sick with bronchitis for the past couple of weeks. She continues to have some breathing difficulty. LBP continues - better today with pain meds.    Currently in Pain? No/denies                         [facility] Adult PT Treatment/Exercise - 04/09/17 0001      Lumbar Exercises: Aerobic   Stationary Bike Nu step L5 x 5 min      Lumbar Exercises: Supine   Ab Set --  3 part core 10 sec x 10    Straight Leg Raise 10 reps  with TA contraction , each side   Isometric Hip Flexion 10 reps;5 seconds  each side     Lumbar Exercises: Prone   Other Prone Lumbar Exercises 10 reps pelvic press with hip ext, and bent knee hip ext.      Modalities   Modalities Moist Heat     Moist Heat Therapy   Number Minutes Moist Heat 15 Minutes   Moist Heat Location Lumbar Spine     Manual Therapy   Manual therapy comments pt prone    Soft tissue mobilization deep tissue work through lumbar spine into Rt posteriolateral hip    Myofascial Release Rt posterior hip area           Trigger Point Dry Needling - 04/09/17 1445    Consent Given? Yes   Education Handout Provided Yes   Muscles Treated Lower Body --  Rt    Gluteus Maximus Response Palpable increased muscle length;Twitch response elicited   Piriformis Response Palpable increased muscle length;Twitch response elicited              PT Education - 04/09/17 1435    Education provided Yes   Education Details HEP DN   Person(s) Educated Patient   Methods Explanation;Demonstration;Tactile cues;Verbal cues;Handout   Comprehension Verbalized understanding;Returned demonstration;Verbal cues required;Tactile cues required          PT Short Term Goals -  04/09/17 1405      PT SHORT TERM GOAL #1   Title -   Time 0     PT SHORT TERM GOAL #2   Title -   Time 0     PT SHORT TERM GOAL #3   Title -   Time 0           PT Long Term Goals - 04/09/17 1404      PT LONG TERM GOAL #1   Title Patient I in advanced HEP (05/03/17)    Time 6   Period Weeks   Status On-going     PT LONG TERM GOAL #2   Title report =/> 50% reduction in LBP with walking to not need medication as much ( 05/03/17)    Time 6   Period Weeks   Status On-going     PT LONG TERM GOAL #3   Title Increased bilat hip strength to 5-/5 (05/03/17)   Time 6   Period Weeks   Status On-going     PT LONG TERM GOAL #4   Title demo strong equal contraction of the multifidi and TA ( 5/14/18_    Time 6   Period Weeks   Status On-going     PT LONG TERM GOAL #5   Title Improve FOTO to </= 38% limitation    Time 6   Period Weeks   Status On-going               Plan - 04/09/17 1410    Clinical Impression Statement Patient  has been sock and unable to come for treatment or do her exercises at home. She continues to have LBP but it is improved with pain meds. She continues to have difficulty activating deep core stabiliers and has weakness in the Rt hip as well as tightness in the Rt piritormis and hip abductors. She demonstrated clinic exercises form initial visit with verbal and tactile cues of PT. Reviewed three part core. Patient responded well to DN and manual work Lt hip area. Will continue PT to address problems identified and progress patient to independent HEP with trainer as indicated.    Rehab Potential Good   PT Frequency 2x / week   PT Duration 6 weeks   PT Treatment/Interventions Moist Heat;Ultrasound;Traction;Therapeutic exercise;Dry needling;Manual techniques;Neuromuscular re-education;Cryotherapy;Iontophoresis /ml Dexamethasone;Electrical Stimulation;Patient/family education   PT Next Visit Plan progress deep core stabilization ex.    Consulted and Agree with Plan of Care Patient      Patient will benefit from skilled therapeutic intervention in order to improve the following deficits and impairments:  Decreased strength, Pain, Increased muscle spasms, Improper body mechanics  Visit Diagnosis: Chronic bilateral low back pain without sciatica  Muscle weakness (generalized)  Muscle spasm of back     Problem List Patient Active Problem List   Diagnosis Date Noted  . Abnormal ear sensation 07/28/2016  . Hearing loss 07/28/2016  . Loose stools 07/26/2015  . Trapezius strain 07/26/2015  . Right knee meniscal tear 05/10/2015  . Lumbar spondylosis 05/10/2015  . Postmenopausal atrophic vaginitis 12/18/2014  . COPD, mild (HCC) 03/30/2014  . Generalized anxiety disorder 01/14/2012  . HEARING DEFICIT 05/29/2011  . PANIC DISORDER 08/12/2010  . LOW BACK PAIN, CHRONIC 07/16/2010  . PELVIC FRACTURE 04/15/2010  . DECREASED LIBIDO 06/26/2009  . Major depressive disorder, recurrent episode, severe  (HCC) 12/03/2008  . SCOLIOSIS 11/06/2008  . Osteopenia 09/25/2008  . POSTMENOPAUSAL STATUS 09/11/2008  . Hyperlipidemia 05/28/2008  . ARTHRITIS, HIPS, BILATERAL 02/21/2008  .  APHTHOUS ULCERS 11/11/2007  . Hypothyroidism 10/05/2007  . Recurrent urinary tract infection 07/18/2007  . SACROILIITIS, RIGHT 07/18/2007    Kayl Stogdill Rober Minion PT, MPH  04/09/2017, 2:47 PM  [facility] [address] [place] [address] [place], [place], [address] Phone: [phone]   Fax:  [phone]  Name: Buna Cuppett MRN: [id number] Date of Birth: 12-14-1956

## 2017-04-09 NOTE — Patient Instructions (Addendum)
Trigger Point Dry Needling  . What is Trigger Point Dry Needling (DN)? o DN is a physical therapy technique used to treat muscle pain and dysfunction. Specifically, DN helps deactivate muscle trigger points (muscle knots).  o A thin filiform needle is used to penetrate the skin and stimulate the underlying trigger point. The goal is for a local twitch response (LTR) to occur and for the trigger point to relax. No medication of any kind is injected during the procedure.   . What Does Trigger Point Dry Needling Feel Like?  o The procedure feels different for each individual patient. Some patients report that they do not actually feel the needle enter the skin and overall the process is not painful. Very mild bleeding may occur. However, many patients feel a deep cramping in the muscle in which the needle was inserted. This is the local twitch response.   Marland Kitchen How Will I feel after the treatment? o Soreness is normal, and the onset of soreness may not occur for a few hours. Typically this soreness does not last longer than two days.  o Bruising is uncommon, however; ice can be used to decrease any possible bruising.  o In rare cases feeling tired or nauseous after the treatment is normal. In addition, your symptoms may get worse before they get better, this period will typically not last longer than 24 hours.   . What Can I do After My Treatment? o Increase your hydration by drinking more water for the next 24 hours. o You may place ice or heat on the areas treated that have become sore, however, do not use heat on inflamed or bruised areas. Heat often brings more relief post needling. o You can continue your regular activities, but vigorous activity is not recommended initially after the treatment for 24 hours. o DN is best combined with other physical therapy such as strengthening, stretching, and other therapies.    Abdominal Bracing With Pelvic Floor (Hook-Lying)- Three part core     With  neutral spine, tighten pelvic floor and abdominals sucking belly button to back bone, tighten muscles in low back at waist. Hold 10 sec . Repeat __10_ times. Do __several_ times a day. Progress to do this in sitting; standing; walking

## 2017-04-12 ENCOUNTER — Encounter: Payer: Self-pay | Admitting: Rehabilitative and Restorative Service Providers"

## 2017-04-16 ENCOUNTER — Other Ambulatory Visit: Payer: Self-pay | Admitting: Family Medicine

## 2017-04-16 ENCOUNTER — Ambulatory Visit (INDEPENDENT_AMBULATORY_CARE_PROVIDER_SITE_OTHER): Payer: 59 | Admitting: Physical Therapy

## 2017-04-16 ENCOUNTER — Encounter: Payer: Self-pay | Admitting: Physical Therapy

## 2017-04-16 DIAGNOSIS — R531 Weakness: Secondary | ICD-10-CM

## 2017-04-16 DIAGNOSIS — M62830 Muscle spasm of back: Secondary | ICD-10-CM | POA: Diagnosis not present

## 2017-04-16 DIAGNOSIS — M545 Low back pain: Secondary | ICD-10-CM

## 2017-04-16 DIAGNOSIS — G8929 Other chronic pain: Secondary | ICD-10-CM

## 2017-04-16 DIAGNOSIS — M6281 Muscle weakness (generalized): Secondary | ICD-10-CM | POA: Diagnosis not present

## 2017-04-16 DIAGNOSIS — Z7409 Other reduced mobility: Secondary | ICD-10-CM

## 2017-04-16 NOTE — Therapy (Signed)
[place], [place], [address] Phone: [phone]   Fax:  [phone]  Physical Therapy Treatment  Patient Details  Name: [name] MRN: [id number] Date of Birth: [birth year] Referring Provider: Dr Steva Colder  Encounter Date: 04/16/2017      PT End of Session - 04/16/17 1439    Visit Number 3   Number of Visits 12   Date for PT Re-Evaluation 05/03/17   PT Start Time 9242   PT Stop Time 1533   PT Time Calculation (min) 54 min   Activity Tolerance Patient tolerated treatment well      Past Medical History:  Diagnosis Date  . Anxiety   . Arthritis    Right Hip  . Breast disorder     Rt Breast Calcifications  . Chronic cystitis   . Depression   . Dyspareunia   . Hypercholesterolemia   . Hypothyroidism   . Plantar fasciitis of left foot    2013  . Torn meniscus 11/13/15   Rt knee    Past Surgical History:  Procedure Laterality Date  . BREAST ENHANCEMENT SURGERY    . KNEE SURGERY Right 11/16  . plastic surgry to eyes    . Scoliosis repair with Harrington Rods      There were no vitals filed for this visit.      Subjective Assessment - 04/16/17 1440    Subjective Eiza reports she doesn't think the DN helped reduce her pain, she did feel like her gluts were "lighter"    Patient Stated Goals strengthen her muscles.    Currently in Pain? No/denies  because she took a pain pill prior to therapy   Aggravating Factors  about an hour after getting up with the movement of  ADLs   Pain Relieving Factors medication            [facility] PT Assessment - 04/16/17 0001      Assessment   Medical Diagnosis Lumbar spondylosis   Referring Provider Dr Steva Colder   Onset Date/Surgical Date 03/23/07   Hand Dominance Left   Next MD Visit 6 wks   Prior Therapy 2 yrs ago had a couple visits.      Strength   Right Hip Extension 4+/5   Left Hip Extension 5/5   Lumbar Extension --  multifidi, no  change, Rt good, Lt fair                     [facility] Adult PT Treatment/Exercise - 04/16/17 0001      Lumbar Exercises: Aerobic   Stationary Bike Nu step L5 x 5 min      Lumbar Exercises: Prone   Opposite Arm/Leg Raise Left arm/Right leg;Right arm/Left leg;20 reps  alternating sides   Other Prone Lumbar Exercises 2x10 upper body lifts     Modalities   Modalities Moist Heat     Moist Heat Therapy   Number Minutes Moist Heat 15 Minutes   Moist Heat Location Lumbar Spine  and bulttocks     Manual Therapy   Manual therapy comments pt sideling and prone   Soft tissue mobilization STM to lumbar paraspinals, Rt hip/gluts/piriformis          Trigger Point Dry Needling - 04/16/17 1455    Consent Given? Yes   Education Handout Provided No   Muscles Treated Upper Body Longissimus;Quadratus Lumborum   Muscles Treated Lower Body Piriformis   Longissimus Response Palpable increased muscle length;Twitch  response elicited  bilat G3-1   Gluteus Maximus Response Palpable increased muscle length;Twitch response elicited  Rt   Piriformis Response Twitch response elicited;Palpable increased muscle length  Rt                PT Short Term Goals - 04/09/17 1405      PT SHORT TERM GOAL #1   Title -   Time 0     PT SHORT TERM GOAL #2   Title -   Time 0     PT SHORT TERM GOAL #3   Title -   Time 0           PT Long Term Goals - 04/16/17 1451      PT LONG TERM GOAL #1   Title Patient I in advanced HEP (05/03/17)    Status On-going  patient bringing her HEP to perform with her trainer     PT Doyle #2   Title report =/> 50% reduction in LBP with walking to not need medication as much ( 05/03/17)    Status On-going     PT LONG TERM GOAL #3   Title Increased bilat hip strength to 5-/5 (05/03/17)   Status Partially Met     PT LONG TERM GOAL #4   Title demo strong equal contraction of the multifidi and TA ( 5/14/18_    Status On-going     PT  LONG TERM GOAL #5   Title Improve FOTO to </= 38% limitation    Status On-going               Plan - 04/16/17 Wal[place] Brooklee is just now gettting into her exercise routine for core strengthening.  She responds well to ther ex.  Minimal difference after first DN session, did want to try it in her paraspinals.  Paritally met a goal and making slow progress tothe others   Rehab Potential Good   PT Frequency 2x / week   PT Duration 6 weeks   PT Treatment/Interventions Moist Heat;Ultrasound;Traction;Therapeutic exercise;Dry needling;Manual techniques;Neuromuscular re-education;Cryotherapy;Iontophoresis 67m/ml Dexamethasone;Electrical Stimulation;Patient/family education   PT Next Visit Plan assess response to second DN session, progress core ex for her trianer to work on [facility]    Consulted and Agree with Plan of Care Patient      Patient will benefit from skilled therapeutic intervention in order to improve the following deficits and impairments:  Decreased strength, Pain, Increased muscle spasms, Improper body mechanics  Visit Diagnosis: Chronic bilateral low back pain without sciatica  Muscle spasm of back  Muscle weakness (generalized)  Decreased strength, endurance, and mobility     Problem List Patient Active Problem List   Diagnosis Date Noted  . Abnormal ear sensation 07/28/2016  . Hearing loss 07/28/2016  . Loose stools 07/26/2015  . Trapezius strain 07/26/2015  . Right knee meniscal tear 05/10/2015  . Lumbar spondylosis 05/10/2015  . Postmenopausal atrophic vaginitis 12/18/2014  . COPD, mild ([place] 03/30/2014  . Generalized anxiety disorder 01/14/2012  . HEARING DEFICIT 05/29/2011  . PANIC DISORDER 08/12/2010  . LOW BACK PAIN, CHRONIC 07/16/2010  . PELVIC FRACTURE 04/15/2010  . DECREASED LIBIDO 06/26/2009  . Major depressive disorder, recurrent episode, severe ([place] 12/03/2008  . SCOLIOSIS 11/06/2008  . Osteopenia 09/25/2008   . POSTMENOPAUSAL STATUS 09/11/2008  . Hyperlipidemia 05/28/2008  . ARTHRITIS, HIPS, BILATERAL 02/21/2008  . APHTHOUS ULCERS 11/11/2007  . Hypothyroidism 10/05/2007  . Recurrent urinary tract infection 07/18/2007  . SACROILIITIS,  RIGHT 07/18/2007    Jeral Pinch PT 04/16/2017, 3:24 PM  [facility] [place], [place], [address] Phone: [phone]   Fax:  [phone]  Name: Laketta Soderberg MRN: [id number] Date of Birth: 15-Apr-1956

## 2017-04-17 ENCOUNTER — Other Ambulatory Visit: Payer: Self-pay | Admitting: Family Medicine

## 2017-04-21 ENCOUNTER — Other Ambulatory Visit (HOSPITAL_COMMUNITY): Payer: Self-pay | Admitting: Psychiatry

## 2017-04-21 ENCOUNTER — Other Ambulatory Visit (HOSPITAL_COMMUNITY): Payer: Self-pay | Admitting: *Deleted

## 2017-04-21 NOTE — Telephone Encounter (Signed)
Received fax from [facility] requesting a 90 order for Lamictal. Pt's insurance requires a 90 supply to cover the cost. Pt's next apt is schedule on 05/07/17. Please advise.

## 2017-04-22 MED ORDER — LAMOTRIGINE 25 MG PO TABS: 50.0000 mg | ORAL_TABLET | Freq: Every day | ORAL | 0 refills | Status: DC

## 2017-04-22 NOTE — Telephone Encounter (Addendum)
Per Dr. Gilmore LarocheAkhtar, refill authorize for Lamictal 25mg , #180. Rx was sent to pharmacy. Lvm informing pt of refill status. Pt next apt is schedule on 05/07/17.

## 2017-04-22 NOTE — Telephone Encounter (Signed)
Please make aware of patient of risk of having 90 day supply and her responsibilities prior to sending it

## 2017-04-26 ENCOUNTER — Ambulatory Visit (INDEPENDENT_AMBULATORY_CARE_PROVIDER_SITE_OTHER): Payer: 59 | Admitting: Rehabilitative and Restorative Service Providers"

## 2017-04-26 ENCOUNTER — Encounter: Payer: Self-pay | Admitting: Rehabilitative and Restorative Service Providers"

## 2017-04-26 DIAGNOSIS — M6281 Muscle weakness (generalized): Secondary | ICD-10-CM | POA: Diagnosis not present

## 2017-04-26 DIAGNOSIS — M62830 Muscle spasm of back: Secondary | ICD-10-CM

## 2017-04-26 DIAGNOSIS — G8929 Other chronic pain: Secondary | ICD-10-CM

## 2017-04-26 DIAGNOSIS — M545 Low back pain: Secondary | ICD-10-CM | POA: Diagnosis not present

## 2017-04-26 NOTE — Patient Instructions (Signed)
Back Wall Slide    With feet __12-15__ inches from wall, lean as much of back against the wall as possible. Gently squat down _a few__ inches, keeping core tight. Hold __5__ seconds while counting out loud. Repeat __20__ times. Do _1-2___ sessions per day.   Low Row: Standing   Face anchor, feet shoulder width apart. Palms up, pull arms back, squeezing shoulder blades together. Repeat 10__ times per set. Do 2-3__ sets per session. Do 2-3__ sessions per day. Anchor Height: Waist   Strengthening: Resisted Extension   Hold tubing in right hand, arm forward. Pull arm back, elbow straight. Repeat _10___ times per set. Do 2-3____ sets per session. Do 2-3____ sessions per day.

## 2017-04-26 NOTE — Therapy (Signed)
[place], [place], [address] Phone: [phone]   Fax:  [phone]  Physical Therapy Treatment  Patient Details  Name: [name] MRN: [id number] Date of Birth: 1956-10-11 Referring Provider: Dr Lynne Leader  Encounter Date: 04/26/2017      PT End of Session - 04/26/17 1518    Visit Number 4   Number of Visits 12   Date for PT Re-Evaluation 05/03/17   PT Start Time 2951   PT Stop Time 8841   PT Time Calculation (min) 58 min   Activity Tolerance Patient tolerated treatment well      Past Medical History:  Diagnosis Date  . Anxiety   . Arthritis    Right Hip  . Breast disorder     Rt Breast Calcifications  . Chronic cystitis   . Depression   . Dyspareunia   . Hypercholesterolemia   . Hypothyroidism   . Plantar fasciitis of left foot    2013  . Torn meniscus 11/13/15   Rt knee    Past Surgical History:  Procedure Laterality Date  . BREAST ENHANCEMENT SURGERY    . KNEE SURGERY Right 11/16  . plastic surgry to eyes    . Scoliosis repair with Harrington Rods      There were no vitals filed for this visit.      Subjective Assessment - 04/26/17 1519    Subjective Not sure the last needing that was done last time helped. Had a spinal nerve ablation 04/19/17. [name] reports that she was sore from the proceedure. She feels better if she is not doing anything but will have onset of pain again if she does anything - like moving around and doing any activity. she returns to MD in about a month.  has been out of town for the past 4 days - has not done her exercises at home or while she was out of town. Not very good about doing her exercises independently.    Currently in Pain? No/denies   Pain Location Back   Pain Orientation Lower   Pain Descriptors / Indicators Dull   Pain Type Chronic pain   Pain Onset More than a month ago   Pain Frequency Intermittent   Aggravating Factors  about  an hour after she gets up in the morning; ADL's    Pain Relieving Factors medication             [facility] PT Assessment - 04/26/17 0001      Assessment   Medical Diagnosis Lumbar spondylosis   Referring Provider Dr Lynne Leader   Onset Date/Surgical Date 03/23/07   Hand Dominance Left   Next MD Visit 6 wks   Prior Therapy 2 yrs ago had a couple visits.      Posture/Postural Control   Postural Limitations Decreased thoracic kyphosis;Weight shift right  elevated Rt shoulder      Strength   Right Hip Flexion 5/5   Right Hip Extension 4+/5   Right Hip ABduction 4+/5   Left Hip Flexion 5/5   Left Hip Extension 5/5   Left Hip ABduction 5/5                     [facility] Adult PT Treatment/Exercise - 04/26/17 0001      Lumbar Exercises: Stretches   Passive Hamstring Stretch 2 reps;30 seconds     Lumbar Exercises: Aerobic   Stationary Bike Nu step L5 x 5 min  Lumbar Exercises: Standing   Wall Slides 20 reps  core engaged with therapy ball    Row Strengthening;Right;Left;20 reps  core engaged   Theraband Level (Row) Level 3 (Green)   Shoulder Extension Strengthening;Right;Left;20 reps  core engaged   Theraband Level (Shoulder Extension) Level 3 (Green)     Lumbar Exercises: Supine   Ab Set --  3 part core 10 sec hold x 10    Bridge 10 reps  core engaged - 5 sec hold    Other Supine Lumbar Exercises alternate clam with core engaged x 10 each LE      Lumbar Exercises: Prone   Opposite Arm/Leg Raise Left arm/Right leg;Right arm/Left leg;20 reps  alternating sides   Other Prone Lumbar Exercises 2x10 upper body lifts     Moist Heat Therapy   Number Minutes Moist Heat 20 Minutes   Moist Heat Location Lumbar Spine  and bulttocks                PT Education - 04/26/17 1549    Education provided Yes   Education Details reviewed HEP; added exercises    Person(s) Educated Patient   Methods Explanation;Demonstration;Tactile cues;Verbal cues;Handout    Comprehension Verbalized understanding;Returned demonstration;Verbal cues required;Tactile cues required          PT Short Term Goals - 04/09/17 1405      PT SHORT TERM GOAL #1   Title -   Time 0     PT SHORT TERM GOAL #2   Title -   Time 0     PT SHORT TERM GOAL #3   Title -   Time 0           PT Long Term Goals - 04/26/17 1524      PT LONG TERM GOAL #1   Title Patient I in advanced HEP (05/03/17)    Time 6   Period Weeks   Status On-going     PT LONG TERM GOAL #2   Title report =/> 50% reduction in LBP with walking to not need medication as much ( 05/03/17)    Time 6   Period Weeks   Status On-going     PT LONG TERM GOAL #3   Title Increased bilat hip strength to 5-/5 (05/03/17)   Time 6   Period Weeks   Status Partially Met     PT LONG TERM GOAL #4   Title demo strong equal contraction of the multifidi and TA ( 5/14/18_    Time 6   Period Weeks   Status On-going     PT LONG TERM GOAL #5   Title Improve FOTO to </= 38% limitation    Time 6   Period Weeks   Status On-going               Plan - 04/26/17 1530    Clinical Impression Statement [name] has not worked on her core exercises. Reinforced the importance of independent HEP. Not sure she wants to try the DN again. Will await further decision from pt to repeat DN. No goals accomplished.    Rehab Potential Good   PT Frequency 2x / week   PT Duration 6 weeks   PT Treatment/Interventions Moist Heat;Ultrasound;Traction;Therapeutic exercise;Dry needling;Manual techniques;Neuromuscular re-education;Cryotherapy;Iontophoresis 50m/ml Dexamethasone;Electrical Stimulation;Patient/family education   PT Next Visit Plan unsure of response to DN - may repeat as indicated/patient requests, progress core ex for her trianer to work on with her.    Consulted and Agree with Plan of  Care Patient      Patient will benefit from skilled therapeutic intervention in order to improve the following deficits and  impairments:  Decreased strength, Pain, Increased muscle spasms, Improper body mechanics  Visit Diagnosis: Chronic bilateral low back pain without sciatica  Muscle spasm of back  Muscle weakness (generalized)     Problem List Patient Active Problem List   Diagnosis Date Noted  . Abnormal ear sensation 07/28/2016  . Hearing loss 07/28/2016  . Loose stools 07/26/2015  . Trapezius strain 07/26/2015  . Right knee meniscal tear 05/10/2015  . Lumbar spondylosis 05/10/2015  . Postmenopausal atrophic vaginitis 12/18/2014  . COPD, mild ([place]) 03/30/2014  . Generalized anxiety disorder 01/14/2012  . HEARING DEFICIT 05/29/2011  . PANIC DISORDER 08/12/2010  . LOW BACK PAIN, CHRONIC 07/16/2010  . PELVIC FRACTURE 04/15/2010  . DECREASED LIBIDO 06/26/2009  . Major depressive disorder, recurrent episode, severe ([place]) 12/03/2008  . SCOLIOSIS 11/06/2008  . Osteopenia 09/25/2008  . POSTMENOPAUSAL STATUS 09/11/2008  . Hyperlipidemia 05/28/2008  . ARTHRITIS, HIPS, BILATERAL 02/21/2008  . APHTHOUS ULCERS 11/11/2007  . Hypothyroidism 10/05/2007  . Recurrent urinary tract infection 07/18/2007  . SACROILIITIS, RIGHT 07/18/2007    Celyn Nilda Simmer PT, MPH  04/26/2017, 3:59 PM  [facility] [place], [place], [address] Phone: [phone]   Fax:  [phone]  Name: [name] MRN: [id number] Date of Birth: 1956-08-21

## 2017-04-26 NOTE — Telephone Encounter (Signed)
Received fax from [facility]  Requesting refills for Buspar and Prozac. Per Dr. Gilmore LarocheAkhtar, refills are denied. Refills have been requested too soon. Pt received a printed Rx for Buspar and Prozac on 03/12/17 w/ 1 additional refill. Pt's next apt is schedule on 05/07/17. Nothing further is need at this time.

## 2017-04-30 ENCOUNTER — Ambulatory Visit (INDEPENDENT_AMBULATORY_CARE_PROVIDER_SITE_OTHER): Payer: 59 | Admitting: Psychology

## 2017-04-30 ENCOUNTER — Telehealth: Payer: Self-pay | Admitting: *Deleted

## 2017-04-30 ENCOUNTER — Ambulatory Visit: Payer: Self-pay | Admitting: Family Medicine

## 2017-04-30 DIAGNOSIS — F341 Dysthymic disorder: Secondary | ICD-10-CM

## 2017-04-30 NOTE — Telephone Encounter (Signed)
Pt called and lvm that was inaudible. I called back and asked that she return call .Loralee PacasBarkley, Terese Heier CornlandLynetta

## 2017-04-30 NOTE — Telephone Encounter (Signed)
Pt called back and lvm asking that her medication list be faxed to Dr. Jason FilaBray [phone]. Med list printed and faxed.Sharon PacasBarkley, [name] [place] HeightsLynetta

## 2017-05-03 ENCOUNTER — Encounter: Payer: Self-pay | Admitting: Rehabilitative and Restorative Service Providers"

## 2017-05-04 ENCOUNTER — Encounter: Payer: Self-pay | Admitting: Family Medicine

## 2017-05-04 ENCOUNTER — Ambulatory Visit (INDEPENDENT_AMBULATORY_CARE_PROVIDER_SITE_OTHER): Payer: 59 | Admitting: Family Medicine

## 2017-05-04 ENCOUNTER — Encounter: Payer: Self-pay | Admitting: Emergency Medicine

## 2017-05-04 VITALS — BP 102/60 | HR 97 | Temp 99.2°F | Ht 68.0 in | Wt 138.0 lb

## 2017-05-04 DIAGNOSIS — J019 Acute sinusitis, unspecified: Secondary | ICD-10-CM

## 2017-05-04 DIAGNOSIS — J301 Allergic rhinitis due to pollen: Secondary | ICD-10-CM | POA: Diagnosis not present

## 2017-05-04 MED ORDER — AMOXICILLIN-POT CLAVULANATE 875-125 MG PO TABS: 1.0000 | ORAL_TABLET | Freq: Two times a day (BID) | ORAL | 0 refills | Status: DC

## 2017-05-04 NOTE — Patient Instructions (Addendum)

## 2017-05-04 NOTE — Progress Notes (Signed)
   Subjective:    Patient ID: [name], female    DOB: 1956-04-27, 61 y.o.   MRN: [id number]  HPI Nasal congestion with significant post nasal drip 1 week. Taking Benadryl. She also has a cough and she is getting some yellow-colored mucus. She's had some chills. + ears itching.  No SOB or wheezing.  She has been using Sudafed as well.  NO worsening or alleviating factors.       Review of Systems        Objective:   Physical Exam  Constitutional: She is oriented to person, place, and time. She appears well-developed and well-nourished.  HENT:  Head: Normocephalic and atraumatic.  Right Ear: External ear normal.  Left Ear: External ear normal.  Nose: Nose normal.  Mouth/Throat: Oropharynx is clear and moist.  TMs and canals are clear.   Eyes: Conjunctivae and EOM are normal. Pupils are equal, round, and reactive to light.  Neck: Neck supple. No thyromegaly present.  Cardiovascular: Normal rate, regular rhythm and normal heart sounds.   Pulmonary/Chest: Effort normal and breath sounds normal. She has no wheezes.  Lymphadenopathy:    She has no cervical adenopathy.  Neurological: She is alert and oriented to person, place, and time.  Skin: Skin is warm and dry.  Psychiatric: She has a normal mood and affect.          Assessment & Plan:  Acute sinusitis with Allergic rhinitis - Will tx with amoxicillin and flonase.  She wants to pick up flonase OTC. Call if not better in one week.

## 2017-05-07 ENCOUNTER — Ambulatory Visit: Payer: 59 | Admitting: Family Medicine

## 2017-05-07 ENCOUNTER — Ambulatory Visit (HOSPITAL_COMMUNITY): Payer: Self-pay | Admitting: Psychiatry

## 2017-05-07 ENCOUNTER — Telehealth: Payer: Self-pay | Admitting: *Deleted

## 2017-05-07 ENCOUNTER — Encounter: Payer: Self-pay | Admitting: Rehabilitative and Restorative Service Providers"

## 2017-05-10 ENCOUNTER — Telehealth (HOSPITAL_COMMUNITY): Payer: Self-pay | Admitting: Psychiatry

## 2017-05-10 ENCOUNTER — Encounter: Payer: Self-pay | Admitting: Rehabilitative and Restorative Service Providers"

## 2017-05-10 ENCOUNTER — Ambulatory Visit (INDEPENDENT_AMBULATORY_CARE_PROVIDER_SITE_OTHER): Payer: 59 | Admitting: Rehabilitative and Restorative Service Providers"

## 2017-05-10 DIAGNOSIS — M62830 Muscle spasm of back: Secondary | ICD-10-CM | POA: Diagnosis not present

## 2017-05-10 DIAGNOSIS — M6281 Muscle weakness (generalized): Secondary | ICD-10-CM | POA: Diagnosis not present

## 2017-05-10 DIAGNOSIS — G8929 Other chronic pain: Secondary | ICD-10-CM

## 2017-05-10 DIAGNOSIS — M545 Low back pain, unspecified: Secondary | ICD-10-CM

## 2017-05-10 NOTE — Therapy (Signed)
[facility] [address] [place], [place], [address] Phone: [phone]   Fax:  [phone]  Physical Therapy Treatment  Patient Details  Name: [name] MRN: [id number] Date of Birth: 1956/03/23 Referring Provider: Dr Clementeen GrahamEvan Corey  Encounter Date: 05/10/2017      PT End of Session - 05/10/17 1143    Visit Number 5   Number of Visits 18   Date for PT Re-Evaluation 06/21/17   PT Start Time 1143   PT Stop Time 1209   PT Time Calculation (min) 26 min   Activity Tolerance Patient tolerated treatment well      Past Medical History:  Diagnosis Date  . Anxiety   . Arthritis    Right Hip  . Breast disorder     Rt Breast Calcifications  . Chronic cystitis   . Depression   . Dyspareunia   . Hypercholesterolemia   . Hypothyroidism   . Plantar fasciitis of left foot    2013  . Torn meniscus 11/13/15   Rt knee    Past Surgical History:  Procedure Laterality Date  . BREAST ENHANCEMENT SURGERY    . KNEE SURGERY Right 11/16  . plastic surgry to eyes    . Scoliosis repair with Harrington Rods      There were no vitals filed for this visit.      Subjective Assessment - 05/10/17 1147    Subjective Patient reports that she has been sick for the past two weeks - this is her first day out of her home. Feeling some better. She notes that her back has been OK which has suprised her. She has not done any exercises or worked with ther Systems analystpersonal trainer.    Currently in Pain? No/denies            [facility] PT Assessment - 05/10/17 0001      Assessment   Medical Diagnosis Lumbar spondylosis   Referring Provider Dr Clementeen GrahamEvan Corey   Onset Date/Surgical Date 03/23/07   Hand Dominance Left   Next MD Visit PRN    Prior Therapy 2 yrs ago had a couple visits.      Posture/Postural Control   Postural Limitations Decreased thoracic kyphosis;Weight shift right  elevated Rt shoulder      AROM   Lumbar Flexion 6 in from floor     Lumbar Extension decreased 50%   Lumbar - Right Rotation equal bilat   Lumbar - Left Rotation equal bilat     Strength   Right Hip Flexion 5/5   Right Hip Extension 4+/5   Right Hip ABduction 4/5   Left Hip Flexion 5/5   Left Hip Extension 5/5   Left Hip ABduction 5/5                     [facility] Adult PT Treatment/Exercise - 05/10/17 0001      Lumbar Exercises: Stretches   Passive Hamstring Stretch 2 reps;30 seconds     Lumbar Exercises: Aerobic   Stationary Bike Nu step L4 x 5 min   trial L6 - pt fatigued decreased to L4 d/t recent illness      Lumbar Exercises: Standing   Other Standing Lumbar Exercises hip extension 10 x 2 set; hip abduction 10 x 2 sets core engaged      Lumbar Exercises: Supine   Ab Set --  3 part core 10 sec hold x 10      Lumbar Exercises: Prone   Opposite  Arm/Leg Raise Left arm/Right leg;Right arm/Left leg;20 reps  alternating sides   Other Prone Lumbar Exercises 10 upper body lifts   Other Prone Lumbar Exercises pelvic press x 10; pelvic press with alt leg lift x 10 each LE      Moist Heat Therapy   Number Minutes Moist Heat --  patient declined                 PT Education - 05/10/17 1217    Education provided Yes   Education Details encouraged pt to gradually increase activity level   Person(s) Educated Patient   Methods Explanation   Comprehension Verbalized understanding          PT Short Term Goals - 04/09/17 1405      PT SHORT TERM GOAL #1   Title -   Time 0     PT SHORT TERM GOAL #2   Title -   Time 0     PT SHORT TERM GOAL #3   Title -   Time 0           PT Long Term Goals - 05/10/17 1150      PT LONG TERM GOAL #1   Title Patient I in advanced HEP (06/21/17)    Time 12   Period Weeks   Status Revised     PT LONG TERM GOAL #2   Title report =/> 50% reduction in LBP with walking to not need medication as much ( 06/21/17)    Time 12   Period Weeks   Status Revised     PT LONG TERM GOAL  #3   Title Increased bilat hip strength to 5-/5 (06/21/17)   Time 12   Period Weeks   Status Revised     PT LONG TERM GOAL #4   Title demo strong equal contraction of the multifidi and TA ( 06/21/17)   Time 12   Period Weeks   Status Revised     PT LONG TERM GOAL #5   Title Improve FOTO to </= 38% limitation (06/21/17)   Time 12   Period Weeks   Status Revised               Plan - 05/10/17 1148    Clinical Impression Statement [name] has been sick and unable to attend terapy or do her exercises or work with her personal trainer in the past two weeks. Limited endurance for exercise today due to recent illness. Patient will benefit from additional trial of therapy to progress with rehab and address goals of treatment.    Rehab Potential Good   PT Frequency 2x / week   PT Duration 6 weeks   PT Treatment/Interventions Moist Heat;Ultrasound;Traction;Therapeutic exercise;Dry needling;Manual techniques;Neuromuscular re-education;Cryotherapy;Iontophoresis 4mg /ml Dexamethasone;Electrical Stimulation;Patient/family education   PT Next Visit Plan unsure of response to DN - may repeat as indicated/patient requests, progress core ex for her trianer to work on with her.    Consulted and Agree with Plan of Care Patient      Patient will benefit from skilled therapeutic intervention in order to improve the following deficits and impairments:  Decreased strength, Pain, Increased muscle spasms, Improper body mechanics  Visit Diagnosis: Chronic bilateral low back pain without sciatica - Plan: PT plan of care cert/re-cert  Muscle spasm of back - Plan: PT plan of care cert/re-cert  Muscle weakness (generalized) - Plan: PT plan of care cert/re-cert     Problem List Patient Active Problem List   Diagnosis Date Noted  .  Abnormal ear sensation 07/28/2016  . Hearing loss 07/28/2016  . Loose stools 07/26/2015  . Trapezius strain 07/26/2015  . Right knee meniscal tear 05/10/2015  . Lumbar  spondylosis 05/10/2015  . Postmenopausal atrophic vaginitis 12/18/2014  . COPD, mild (HCC) 03/30/2014  . Generalized anxiety disorder 01/14/2012  . HEARING DEFICIT 05/29/2011  . PANIC DISORDER 08/12/2010  . LOW BACK PAIN, CHRONIC 07/16/2010  . PELVIC FRACTURE 04/15/2010  . DECREASED LIBIDO 06/26/2009  . Major depressive disorder, recurrent episode, severe (HCC) 12/03/2008  . SCOLIOSIS 11/06/2008  . Osteopenia 09/25/2008  . POSTMENOPAUSAL STATUS 09/11/2008  . Hyperlipidemia 05/28/2008  . ARTHRITIS, HIPS, BILATERAL 02/21/2008  . APHTHOUS ULCERS 11/11/2007  . Hypothyroidism 10/05/2007  . Recurrent urinary tract infection 07/18/2007  . SACROILIITIS, RIGHT 07/18/2007    Phala Schraeder Rober Minion PT, MPH  05/10/2017, 12:19 PM  [facility] [address] [place], [place], [address] Phone: [phone]   Fax:  [phone]  Name: [name] MRN: [id number] Date of Birth: 06/11/1956

## 2017-05-10 NOTE — Telephone Encounter (Signed)
Needs refill on lamictal

## 2017-05-11 MED ORDER — LAMOTRIGINE 25 MG PO TABS: 50.0000 mg | ORAL_TABLET | Freq: Every day | ORAL | 0 refills | Status: DC

## 2017-05-11 NOTE — Telephone Encounter (Signed)
Refill sent to [facility][pharmacy. Lvm informing pt of refill status. Nothing further is need at this time..Marland Kitchen

## 2017-05-11 NOTE — Telephone Encounter (Signed)
Pt called office requesting a refill for Lamictal 25mg , #180. Per pt insurance will not cover the cost of medication without a 90 day supply.   Spoke with pharmacy, Lamictal rx was last filled on 03/12/17 for a 30 day supply. New prescription was sent to pharmacy. Pt's next apt is schedule on 05/14/17. Lvm informing pt of refill status.

## 2017-05-12 ENCOUNTER — Telehealth (HOSPITAL_COMMUNITY): Payer: Self-pay | Admitting: Psychiatry

## 2017-05-12 MED ORDER — FLUOXETINE HCL 20 MG PO CAPS: 20.0000 mg | ORAL_CAPSULE | Freq: Every day | ORAL | 1 refills | Status: DC

## 2017-05-12 MED ORDER — FLUOXETINE HCL 20 MG PO CAPS: 20.0000 mg | ORAL_CAPSULE | Freq: Every day | ORAL | 0 refills | Status: DC

## 2017-05-12 NOTE — Telephone Encounter (Signed)
prozac refill sent.

## 2017-05-12 NOTE — Telephone Encounter (Signed)
Called and informed of refill status for Prozac. Pt has apt schedule on 05/12/17. Pt shows understanding.

## 2017-05-12 NOTE — Telephone Encounter (Signed)
Pt needs refill on prozac   [facility] [place]

## 2017-05-14 ENCOUNTER — Ambulatory Visit (INDEPENDENT_AMBULATORY_CARE_PROVIDER_SITE_OTHER): Payer: 59 | Admitting: Physical Therapy

## 2017-05-14 ENCOUNTER — Ambulatory Visit (INDEPENDENT_AMBULATORY_CARE_PROVIDER_SITE_OTHER): Payer: 59 | Admitting: Psychiatry

## 2017-05-14 ENCOUNTER — Encounter (HOSPITAL_COMMUNITY): Payer: Self-pay | Admitting: Psychiatry

## 2017-05-14 DIAGNOSIS — Z634 Disappearance and death of family member: Secondary | ICD-10-CM

## 2017-05-14 DIAGNOSIS — F419 Anxiety disorder, unspecified: Secondary | ICD-10-CM

## 2017-05-14 DIAGNOSIS — M545 Low back pain, unspecified: Secondary | ICD-10-CM

## 2017-05-14 DIAGNOSIS — M6281 Muscle weakness (generalized): Secondary | ICD-10-CM | POA: Diagnosis not present

## 2017-05-14 DIAGNOSIS — M62830 Muscle spasm of back: Secondary | ICD-10-CM

## 2017-05-14 DIAGNOSIS — Z87891 Personal history of nicotine dependence: Secondary | ICD-10-CM | POA: Diagnosis not present

## 2017-05-14 DIAGNOSIS — G8929 Other chronic pain: Secondary | ICD-10-CM | POA: Diagnosis not present

## 2017-05-14 DIAGNOSIS — R531 Weakness: Secondary | ICD-10-CM | POA: Diagnosis not present

## 2017-05-14 DIAGNOSIS — Z818 Family history of other mental and behavioral disorders: Secondary | ICD-10-CM

## 2017-05-14 DIAGNOSIS — Z810 Family history of intellectual disabilities: Secondary | ICD-10-CM

## 2017-05-14 DIAGNOSIS — Z813 Family history of other psychoactive substance abuse and dependence: Secondary | ICD-10-CM | POA: Diagnosis not present

## 2017-05-14 DIAGNOSIS — F331 Major depressive disorder, recurrent, moderate: Secondary | ICD-10-CM | POA: Diagnosis not present

## 2017-05-14 DIAGNOSIS — F0630 Mood disorder due to known physiological condition, unspecified: Secondary | ICD-10-CM

## 2017-05-14 DIAGNOSIS — F4321 Adjustment disorder with depressed mood: Secondary | ICD-10-CM

## 2017-05-14 DIAGNOSIS — Z811 Family history of alcohol abuse and dependence: Secondary | ICD-10-CM | POA: Diagnosis not present

## 2017-05-14 DIAGNOSIS — Z7409 Other reduced mobility: Secondary | ICD-10-CM | POA: Diagnosis not present

## 2017-05-14 DIAGNOSIS — F411 Generalized anxiety disorder: Secondary | ICD-10-CM

## 2017-05-14 NOTE — Therapy (Signed)
[facility] [place] [address] [place], [place], [address] Phone: [phone]   Fax:  [phone]  Physical Therapy Treatment  Patient Details  Name: [name] MRN: [id number] Date of Birth: 05-25-1956 Referring Provider: Dr. Denyse Amass   Encounter Date: 05/14/2017      PT End of Session - 05/14/17 1106    Visit Number 6   Number of Visits 18   Date for PT Re-Evaluation 06/21/17   PT Start Time 1100   PT Stop Time 1153  ice pack last 12 min   PT Time Calculation (min) 53 min   Activity Tolerance Patient tolerated treatment well   Behavior During Therapy [facility] for tasks assessed/performed      Past Medical History:  Diagnosis Date  . Anxiety   . Arthritis    Right Hip  . Breast disorder     Rt Breast Calcifications  . Chronic cystitis   . Depression   . Dyspareunia   . Hypercholesterolemia   . Hypothyroidism   . Plantar fasciitis of left foot    2013  . Torn meniscus 11/13/15   Rt knee    Past Surgical History:  Procedure Laterality Date  . BREAST ENHANCEMENT SURGERY    . KNEE SURGERY Right 11/16  . plastic surgry to eyes    . Scoliosis repair with Harrington Rods      There were no vitals filed for this visit.      Subjective Assessment - 05/14/17 1107    Subjective Pt reports she is feeling better than last visit (energy improve, Upper respiratory infection resolved).  No changes since last visit.    Pertinent History scoliosis repair with rod placement yrs ago.    Currently in Pain? Yes   Pain Score 3    Pain Location Back   Pain Orientation Lower;Right;Left   Pain Descriptors / Indicators Dull   Aggravating Factors  lifting WC, bending, prolonged standing   Pain Relieving Factors ice, medication, hot bath            [facility] PT Assessment - 05/14/17 0001      Assessment   Medical Diagnosis Lumbar spondylosis   Referring Provider Dr. Denyse Amass    Onset Date/Surgical Date 03/23/07   Hand  Dominance Left   Next MD Visit PRN           [facility] Adult PT Treatment/Exercise - 05/14/17 0001      Lumbar Exercises: Stretches   Passive Hamstring Stretch 2 reps;30 seconds   Single Knee to Chest Stretch 2 reps;10 seconds     Lumbar Exercises: Aerobic   Stationary Bike Nu step L4 x 5 min      Lumbar Exercises: Standing   Row Strengthening;Right;Left;20 reps  core engaged   Theraband Level (Row) Level 3 (Green)   Shoulder Extension Strengthening;Right;Left;20 reps  core engaged   Theraband Level (Shoulder Extension) Level 2 (Red)   Other Standing Lumbar Exercises hip extension 10 x 2 set; hip abduction 10 x 2 sets core engaged      Lumbar Exercises: Supine   Ab Set 10 reps;5 seconds   Clam 10 reps  each leg; with ab set   Heel Slides 10 reps  with ab set   Bent Knee Raise 10 reps  with ab series   Other Supine Lumbar Exercises arm/leg lengthener x 5 sec x 5 reps each side.      Modalities   Modalities Cryotherapy     Cryotherapy   Number  Minutes Cryotherapy 12 Minutes   Cryotherapy Location Lumbar Spine                PT Education - 05/14/17 1148    Education provided Yes   Education Details Pt educated on rationale and importance of core strengthening.    Person(s) Educated Patient   Methods Explanation   Comprehension Verbalized understanding          PT Short Term Goals - 04/09/17 1405      PT SHORT TERM GOAL #1   Title -   Time 0     PT SHORT TERM GOAL #2   Title -   Time 0     PT SHORT TERM GOAL #3   Title -   Time 0           PT Long Term Goals - 05/14/17 1112      PT LONG TERM GOAL #1   Title Patient I in advanced HEP (06/21/17)    Time 12   Period Weeks   Status On-going     PT LONG TERM GOAL #2   Title report =/> 50% reduction in LBP with walking to not need medication as much ( 06/21/17)    Time 12   Period Weeks   Status On-going     PT LONG TERM GOAL #3   Title Increased bilat hip strength to 5-/5 (06/21/17)   Time  12   Period Weeks   Status On-going     PT LONG TERM GOAL #4   Title demo strong equal contraction of the multifidi and TA ( 06/21/17)   Time 12   Period Weeks   Status On-going     PT LONG TERM GOAL #5   Title Improve FOTO to </= 38% limitation (06/21/17)   Time 12   Period Weeks   Status On-going               Plan - 05/14/17 1130    Clinical Impression Statement Pt demonstrated improved activity tolerance with increased exercises, reps.  She reported decreased pain by end of session.  Time spent re-educating pt on importance of core support to reduce pain and prevent reinjury.  Pt progressing towards goals.    Rehab Potential Good   PT Frequency 2x / week   PT Duration 6 weeks   PT Treatment/Interventions Moist Heat;Ultrasound;Traction;Therapeutic exercise;Dry needling;Manual techniques;Neuromuscular re-education;Cryotherapy;Iontophoresis 4mg /ml Dexamethasone;Electrical Stimulation;Patient/family education   PT Next Visit Plan Continue to progress spinal stabilization / core strengthening.  Assess hip strength.    Consulted and Agree with Plan of Care Patient      Patient will benefit from skilled therapeutic intervention in order to improve the following deficits and impairments:  Decreased strength, Pain, Increased muscle spasms, Improper body mechanics  Visit Diagnosis: Chronic bilateral low back pain without sciatica  Muscle spasm of back  Muscle weakness (generalized)  Decreased strength, endurance, and mobility     Problem List Patient Active Problem List   Diagnosis Date Noted  . Abnormal ear sensation 07/28/2016  . Hearing loss 07/28/2016  . Loose stools 07/26/2015  . Trapezius strain 07/26/2015  . Right knee meniscal tear 05/10/2015  . Lumbar spondylosis 05/10/2015  . Postmenopausal atrophic vaginitis 12/18/2014  . COPD, mild (HCC) 03/30/2014  . Generalized anxiety disorder 01/14/2012  . HEARING DEFICIT 05/29/2011  . PANIC DISORDER 08/12/2010  .  LOW BACK PAIN, CHRONIC 07/16/2010  . PELVIC FRACTURE 04/15/2010  . DECREASED LIBIDO 06/26/2009  . Major depressive disorder,  recurrent episode, severe (HCC) 12/03/2008  . SCOLIOSIS 11/06/2008  . Osteopenia 09/25/2008  . POSTMENOPAUSAL STATUS 09/11/2008  . Hyperlipidemia 05/28/2008  . ARTHRITIS, HIPS, BILATERAL 02/21/2008  . APHTHOUS ULCERS 11/11/2007  . Hypothyroidism 10/05/2007  . Recurrent urinary tract infection 07/18/2007  . SACROILIITIS, RIGHT 07/18/2007   Mayer CamelJennifer Carlson-Long, PTA 05/14/17 12:59 PM  [facility] [address] [place], [place], [address] Phone: [phone]   Fax:  [phone]  Name: Roslynn Ambleancy Boliver MRN: [id number] Date of Birth: Oct 19, 1956

## 2017-05-14 NOTE — Progress Notes (Signed)
Patient ID: Sharon AmbleNancy Greene, female   DOB: [birth year], 61 y.o.   MRN: [id number]   [name] Follow-up Outpatient Visit  [name] [birth year]  Date: 05/14/2017  Chief Complaint:  Follow Up. Depression and anxiety  History of Chief Complaint:   HPI Comments: Ms. [name] is a 61 y/o female with a past psychiatric history significant for Major Depression, Recurrent severe, generalized anxiety and bereavement. The patient returns  for psychiatric services and medication management.   Her depression dates back to 2008 when she lost her mom and also her brother was using drugs. She stopped taking the BuSpar and Lamictal last visit was feeling down and depressed. Stated that and she is doing better now Patient is tolerating Prozac and BuSpar somewhat poor sleep so we talked about changing the Prozac during the daytime and also with taking BuSpar at night if not needed.  She is concerned about her roommate for which she has to spend money and take her in her car for her chores the roommate [place] move back to [place] and that may help her back Grief: settling better Anxiety manageable  Relationship is distant  . Modifying factors: exercise   Review of Systems  Constitutional: Negative for fever.  Cardiovascular: Negative for chest pain and palpitations.  Gastrointestinal: Negative for nausea.  Skin: Negative for itching.  Psychiatric/Behavioral: Positive for depression. Negative for substance abuse.     There were no vitals filed for this visit. Physical Exam  Vitals reviewed.  Constitutional: She appears well-developed and well-nourished. No distress.  Skin: She is not diaphoretic.     Past Medical History: Reviewed Past Medical History:  Diagnosis Date  . Anxiety   . Arthritis    Right Hip  . Breast disorder     Rt Breast Calcifications  . Chronic cystitis   . Depression   . Dyspareunia   . Hypercholesterolemia   . Hypothyroidism   . Plantar fasciitis  of left foot    2013  . Torn meniscus 11/13/15   Rt knee   Allergies: Reviewed Allergies  Allergen Reactions  . Bupropion Hcl Other (See Comments)    irritabillity  . Sulfa Antibiotics Itching   Current Medications: Reviewed Current Outpatient Prescriptions on File Prior to Visit  Medication Sig Dispense Refill  . amoxicillin-clavulanate (AUGMENTIN) 875-125 MG tablet Take 1 tablet by mouth 2 (two) times daily. 20 tablet 0  . busPIRone (BUSPAR) 10 MG tablet Take 1 tablet (10 mg total) by mouth 2 (two) times daily. 60 tablet 1  . calcium-vitamin D (OSCAL WITH D) 500-200 MG-UNIT per tablet Take 2 tablets by mouth daily.     . clonazePAM (KLONOPIN) 0.5 MG tablet TAKE 1 TABLET BY MOUTH EVERY DAY AS NEEDED 90 tablet 0  . FLUoxetine (PROZAC) 20 MG capsule Take 1 capsule (20 mg total) by mouth daily. 30 capsule 1  . lamoTRIgine (LAMICTAL) 25 MG tablet Take 2 tablets (50 mg total) by mouth daily. 180 tablet 0  . levothyroxine (SYNTHROID, LEVOTHROID) 88 MCG tablet TAKE 1 TABLET (88 MCG TOTAL) BY MOUTH DAILY BEFORE BREAKFAST. 90 tablet 2  . Multiple Vitamin (MULTIVITAMIN) tablet Take 1 tablet by mouth daily.      . nitrofurantoin (MACRODANTIN) 50 MG capsule Take 1 capsule (50 mg total) by mouth daily as needed. Due for follow up visit 30 capsule 0  . oxyCODONE-acetaminophen (PERCOCET) 10-325 MG tablet     . phenylephrine (NASAL DECONGESTANT PE) 10 MG TABS tablet Take 10 mg by mouth every 4 (  four) hours as needed.    . valACYclovir (VALTREX) 500 MG tablet TAKE 1 TABLET (500 MG TOTAL) BY MOUTH DAILY AS NEEDED. 90 tablet 1   No current facility-administered medications on file prior to visit.      Substance Abuse History in the last 12 months: Reviewed Social History  Substance Use Topics  . Smoking status: Former Smoker    Quit date: 09/20/1982  . Smokeless tobacco: Never Used  . Alcohol use 0.0 oz/week     Comment: Use is 1-2 a month.  Caffeine: 2 cups a day   Family History:  Reviewed Family History   Problem  Relation  Age of Onset   .  COPD     .  Aneurysm     .  Anxiety disorder  Mother    .  Depression  Mother    .  Alcohol abuse  Father    .  Heart murmur  Father    .  Drug abuse  Brother    .  Anxiety disorder  Brother    .  Depression  Brother    .  Dementia  Maternal Grandfather     Psychiatric Specialty Exam:  Objective: Appearance: Casual and Well Groomed   Eye Contact:: Good   Speech: Clear and Coherent and Normal Rate   Volume: Normal   Mood:  fair   Affect: congruent  Thought Process: Coherent, Logical and Loose   Orientation: Full   Thought Content: WDL   Suicidal Thoughts: No   Homicidal Thoughts: No   Judgement: Fair   Insight: Fair   Psychomotor Activity: Normal   Akathisia: No   Handed: Left   Memory: Immediate 3/3, recent: 1/3   . Assets: Communication Skills  Desire for Improvement  Financial Resources/Insurance  Housing  Leisure Time  Physical Greene  Resilience  Transportation  Vocational/Educational    Laboratory/X-Ray  Psychological Evaluation(s)   NONE  NONE    Assessment:  AXIS I  Major Depression, Recurrent severe-. Generalized anxiety disorder. bereavement  AXIS II  No diagnosis   AXIS III  Past Medical History    Diagnosis  Date    .  Hypercholesterolemia     .  Arthritis       Right Hip    .  Hypothyroidism     .  Chronic cystitis     .  Decreased libido     .  Dyspareunia     .  Breast disorder       Rt Breast Calcifications    .  Depression     .  Anxiety     .  Plantar fasciitis of left foot       2013      AXIS IV  other psychosocial or environmental problems   AXIS V  64   Treatment Plan/Recommendations:   Plan of Care:  PLAN:  1. Affirm with the patient that the medications are taken as ordered. Patient  expressed understanding of how their medications were to be used.    Laboratory:    Psychotherapy: Therapy: brief supportive therapy provided.  Discussed psychosocial  stressors in detail. More than 50% of the visit was spent on individual therapy/counseling.   Medications:  Continue  the following psychiatric medications as written prior to this appointment with the following changes:: continue following  1. Depression: improved while back on lamictal. No rash 2. Anxiety disorder; improved. Continue buspar, can skip night dose  3. Grief. Manageable.  FU 3 months or earlier if needed. Has refills for now            Thresa Ross, MD

## 2017-05-21 ENCOUNTER — Encounter: Payer: Self-pay | Admitting: Rehabilitative and Restorative Service Providers"

## 2017-05-21 ENCOUNTER — Ambulatory Visit (INDEPENDENT_AMBULATORY_CARE_PROVIDER_SITE_OTHER): Payer: 59 | Admitting: Rehabilitative and Restorative Service Providers"

## 2017-05-21 DIAGNOSIS — M545 Low back pain: Secondary | ICD-10-CM

## 2017-05-21 DIAGNOSIS — M6281 Muscle weakness (generalized): Secondary | ICD-10-CM

## 2017-05-21 DIAGNOSIS — G8929 Other chronic pain: Secondary | ICD-10-CM

## 2017-05-21 DIAGNOSIS — M62830 Muscle spasm of back: Secondary | ICD-10-CM | POA: Diagnosis not present

## 2017-05-21 NOTE — Patient Instructions (Signed)
Bridging    Slowly raise buttocks from floor, keeping core tight. Keep green band tight around legs just above knees.  Repeat _10___ times per set. Do __2-3__ sets per session. Do __1__ sessions per day.   SIT TO STAND: No Device    Sit with feet shoulder-width apart, on floor. Lean chest forward, raise hips up from surface. Straighten hips and knees. Weight bear equally on left and right sides. __10_ reps per set, _1-3__ sets per day, __1-2  times per day

## 2017-05-21 NOTE — Therapy (Signed)
[facility] [address] [place], [place], [address] Phone: [phone]   Fax:  [phone]  Physical Therapy Treatment  Patient Details  Name: [name] MRN: [id number] Date of Birth: May 19, 1956 Referring Provider: Dr Denyse Amassorey   Encounter Date: 05/21/2017      PT End of Session - 05/21/17 1354    Visit Number 7   Number of Visits 18   Date for PT Re-Evaluation 06/21/17   PT Start Time 1354   PT Stop Time 1448   PT Time Calculation (min) 54 min   Activity Tolerance Patient tolerated treatment well      Past Medical History:  Diagnosis Date  . Anxiety   . Arthritis    Right Hip  . Breast disorder     Rt Breast Calcifications  . Chronic cystitis   . Depression   . Dyspareunia   . Hypercholesterolemia   . Hypothyroidism   . Plantar fasciitis of left foot    2013  . Torn meniscus 11/13/15   Rt knee    Past Surgical History:  Procedure Laterality Date  . BREAST ENHANCEMENT SURGERY    . KNEE SURGERY Right 11/16  . plastic surgry to eyes    . Scoliosis repair with Harrington Rods      There were no vitals filed for this visit.      Subjective Assessment - 05/21/17 1405    Subjective Feeling better. Not workingon exercises at home but working on then in therapy here. Continues to take pain medication on a regular vasis. She saw her pain management doctor yesterday and will decrease the strength of her pain medication. She is working on returning to her Systems analystpersonal trainer. The trainer also puts her on an incline table which she loves. Harriett Sineancy could tell a difference in the pain in her back when she was exercising.    Currently in Pain? No/denies   Pain Location Back   Pain Orientation Lower;Right;Left   Pain Descriptors / Indicators Dull   Pain Type Chronic pain   Pain Onset More than a month ago   Pain Frequency Intermittent            [facility] PT Assessment - 05/21/17 0001      Assessment   Medical  Diagnosis Lumbar spondylosis   Referring Provider Dr Denyse Amassorey    Onset Date/Surgical Date 03/23/07   Hand Dominance Left   Next MD Visit PRN      Strength   Right Hip Flexion 5/5   Right Hip Extension --  5-/5   Right Hip ABduction 4+/5   Left Hip Flexion 5/5   Left Hip Extension 5/5   Left Hip ABduction 5/5                     [facility] Adult PT Treatment/Exercise - 05/21/17 0001      Lumbar Exercises: Stretches   Passive Hamstring Stretch 2 reps;30 seconds   Single Knee to Chest Stretch 2 reps;10 seconds     Lumbar Exercises: Aerobic   Stationary Bike Nu step L5 x 5 min      Lumbar Exercises: Standing   Row Strengthening;Right;Left;20 reps  core engaged   Theraband Level (Row) Level 3 (Green)   Row Limitations step back with row with green TB x 10 each    Shoulder Extension Strengthening;Right;Left;20 reps  core engaged   Theraband Level (Shoulder Extension) Level 3 (Green)   Other Standing Lumbar Exercises hip extension 10 x  2 set; hip abduction 10 x 2 sets core engaged    Other Standing Lumbar Exercises step back with green TB - bow and arrow with green TB x 10 each UE      Lumbar Exercises: Seated   Sit to Stand 10 reps  core engaged      Lumbar Exercises: Supine   Ab Set --  3 part core 10-15 sec x 10    Clam 10 reps  each leg; with ab set green TB    Bridge 20 reps;3 seconds  green TB with isometric hold in abduction      Lumbar Exercises: Sidelying   Clam 10 reps;3 seconds  green TB distal thigh      Lumbar Exercises: Prone   Other Prone Lumbar Exercises 10 upper body lifts   Other Prone Lumbar Exercises pelvic press x 10; pelvic press with alt leg lift x 10 each LE      Modalities   Modalities Cryotherapy     Cryotherapy   Number Minutes Cryotherapy 15 Minutes   Cryotherapy Location Lumbar Spine                PT Education - 05/21/17 1436    Education provided Yes   Education Details HEP   Person(s) Educated Patient   Methods  Explanation;Demonstration;Tactile cues;Verbal cues;Handout   Comprehension Verbalized understanding;Returned demonstration;Verbal cues required;Tactile cues required          PT Short Term Goals - 04/09/17 1405      PT SHORT TERM GOAL #1   Title -   Time 0     PT SHORT TERM GOAL #2   Title -   Time 0     PT SHORT TERM GOAL #3   Title -   Time 0           PT Long Term Goals - 05/21/17 1354      PT LONG TERM GOAL #1   Title Patient I in advanced HEP (06/21/17)    Time 12   Period Weeks   Status On-going     PT LONG TERM GOAL #2   Title report =/> 50% reduction in LBP with walking to not need medication as much ( 06/21/17)    Time 12   Period Weeks   Status On-going     PT LONG TERM GOAL #3   Title Increased bilat hip strength to 5-/5 (06/21/17)   Time 12   Period Weeks   Status On-going     PT LONG TERM GOAL #4   Title demo strong equal contraction of the multifidi and TA ( 06/21/17)   Time 12   Period Weeks   Status On-going     PT LONG TERM GOAL #5   Title Improve FOTO to </= 38% limitation (06/21/17)   Time 12   Period Weeks   Status On-going               Plan - 05/21/17 1409    Clinical Impression Statement Meylin demonstrates increasing strength Rt LE. She has improved exercise tolerance and endurance. Brihanna reports increased activity at home with less pain in the low back. Zerline will try to return to work out with her Systems analyst next week. She is progressing well toward stated goals of therapy.    Rehab Potential Good   PT Frequency 2x / week   PT Duration 6 weeks   PT Treatment/Interventions Moist Heat;Ultrasound;Traction;Therapeutic exercise;Dry needling;Manual techniques;Neuromuscular re-education;Cryotherapy;Iontophoresis 4mg /ml Dexamethasone;Electrical Stimulation;Patient/family education  PT Next Visit Plan Continue to progress spinal stabilization / core strengthening.     Consulted and Agree with Plan of Care Patient      Patient  will benefit from skilled therapeutic intervention in order to improve the following deficits and impairments:  Decreased strength, Pain, Increased muscle spasms, Improper body mechanics  Visit Diagnosis: Chronic bilateral low back pain without sciatica  Muscle spasm of back  Muscle weakness (generalized)     Problem List Patient Active Problem List   Diagnosis Date Noted  . Abnormal ear sensation 07/28/2016  . Hearing loss 07/28/2016  . Loose stools 07/26/2015  . Trapezius strain 07/26/2015  . Right knee meniscal tear 05/10/2015  . Lumbar spondylosis 05/10/2015  . Postmenopausal atrophic vaginitis 12/18/2014  . COPD, mild (HCC) 03/30/2014  . Generalized anxiety disorder 01/14/2012  . HEARING DEFICIT 05/29/2011  . PANIC DISORDER 08/12/2010  . LOW BACK PAIN, CHRONIC 07/16/2010  . PELVIC FRACTURE 04/15/2010  . DECREASED LIBIDO 06/26/2009  . Major depressive disorder, recurrent episode, severe (HCC) 12/03/2008  . SCOLIOSIS 11/06/2008  . Osteopenia 09/25/2008  . POSTMENOPAUSAL STATUS 09/11/2008  . Hyperlipidemia 05/28/2008  . ARTHRITIS, HIPS, BILATERAL 02/21/2008  . APHTHOUS ULCERS 11/11/2007  . Hypothyroidism 10/05/2007  . Recurrent urinary tract infection 07/18/2007  . SACROILIITIS, RIGHT 07/18/2007    Aneesa Romey Rober Minion PT, MPH  05/21/2017, 2:50 PM  [facility] [address] [place], [place], [address] Phone: [phone]   Fax:  [phone]  Name: Zoraida Havrilla MRN: [id number] Date of Birth: 13-Jun-1956

## 2017-05-22 IMAGING — MG DIGITAL SCREENING BILATERAL MAMMOGRAM WITH IMPLANTS AND CAD
7 series · 7 of 7 positions shown · non-contrast
Comparison: Previous exam(s).

CLINICAL DATA: Screening.

EXAM:
DIGITAL SCREENING BILATERAL MAMMOGRAM WITH IMPLANTS AND CAD
The patient has retropectoral implants. Standard and implant
displaced views were performed.

[R CC]
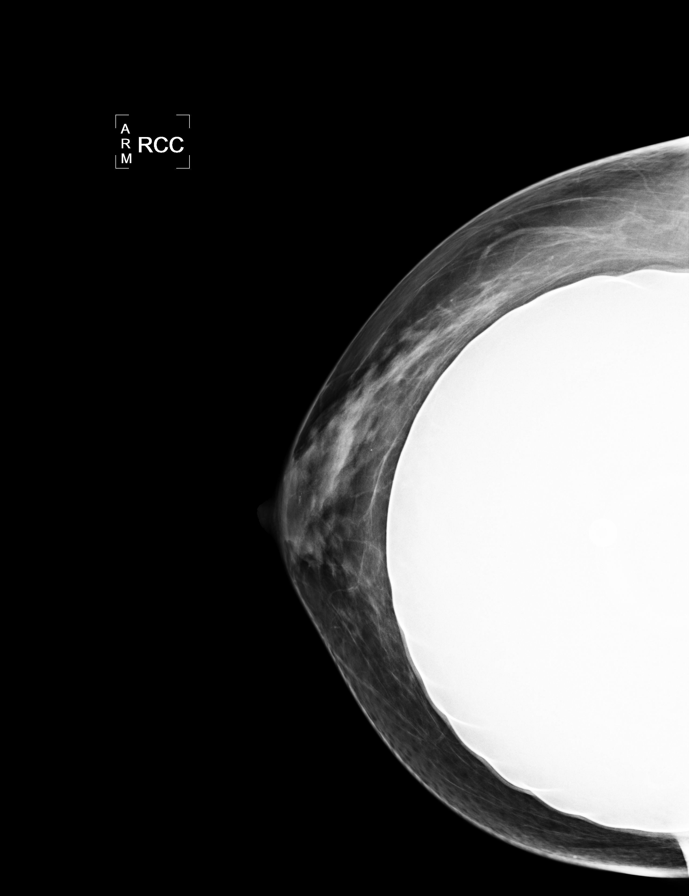

[L CC]
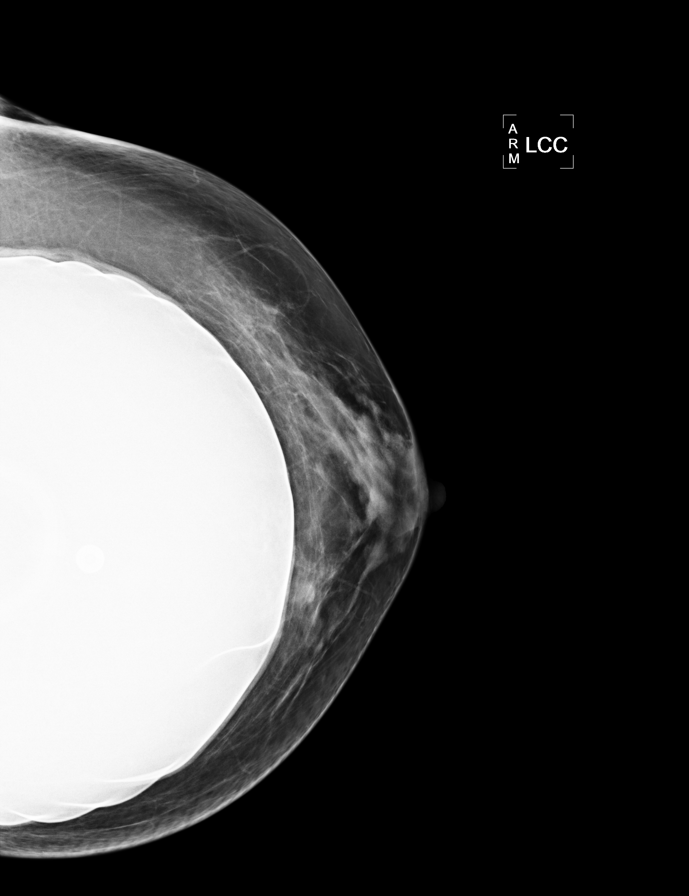

[L MLO]
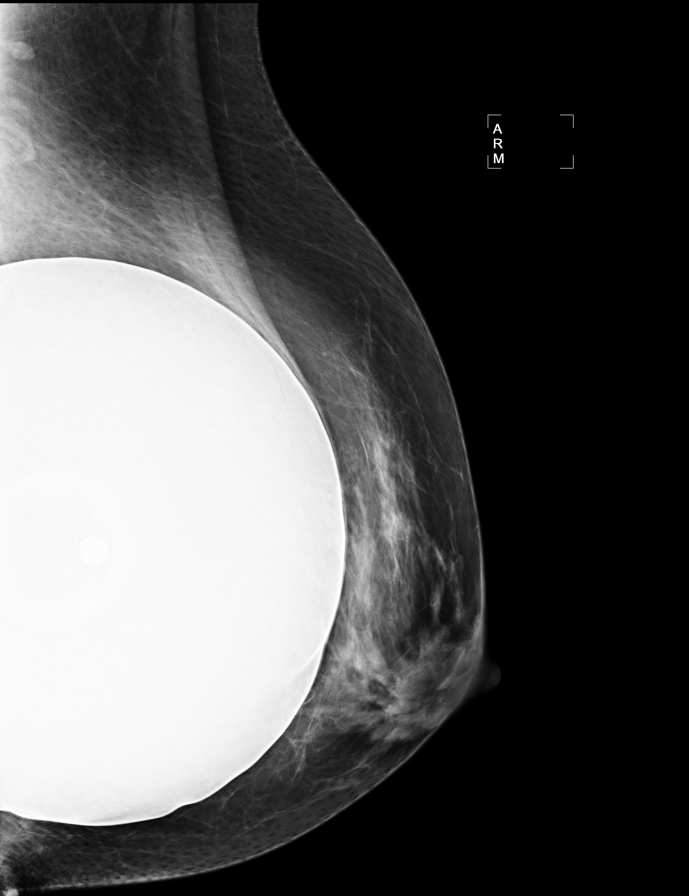

[R MLO]
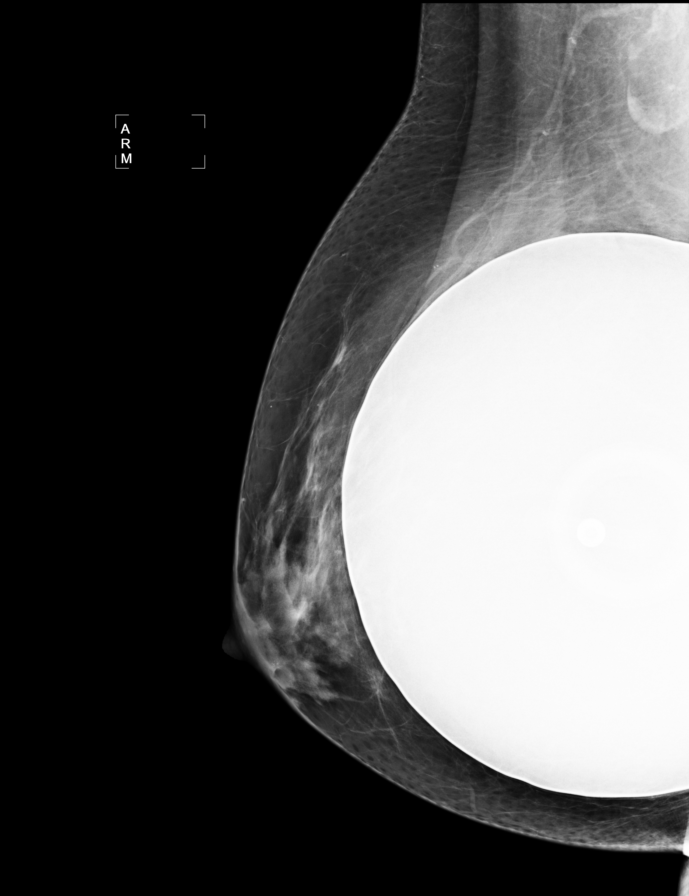

[R CCID]
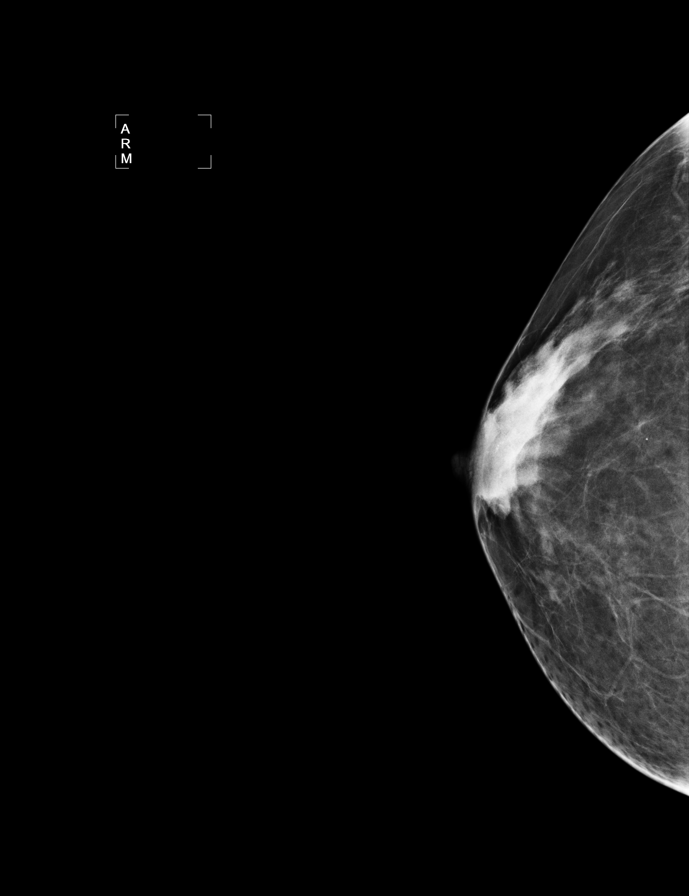

[L MLOID]
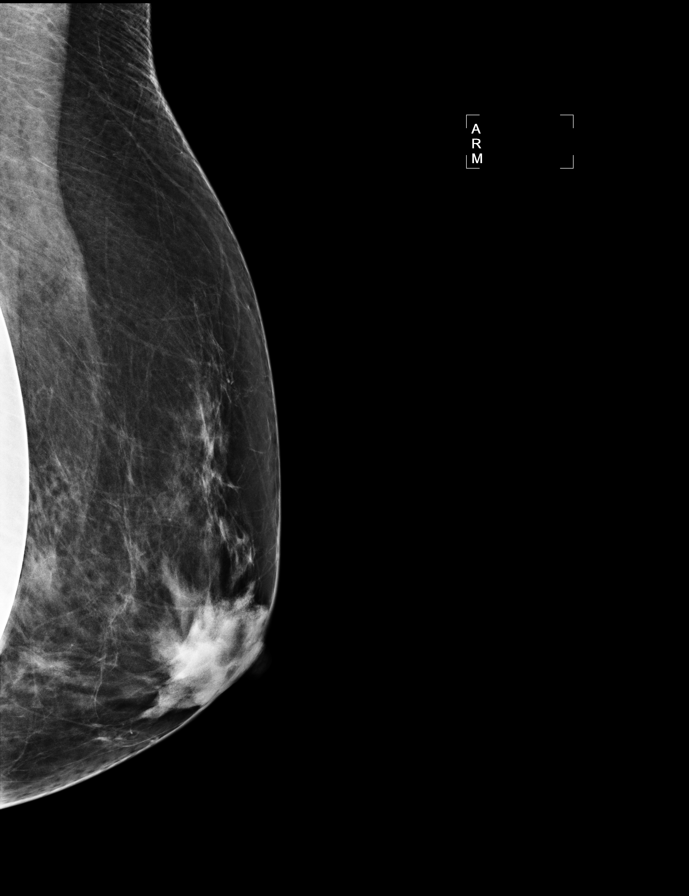

[R MLOID]
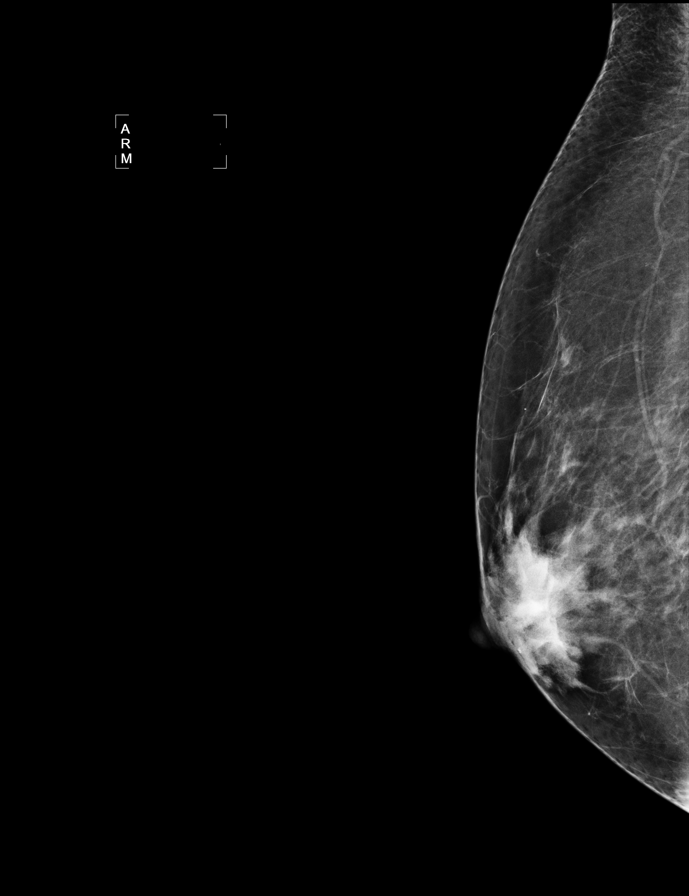

[7 of 7 positions shown; findings below may reference images not displayed]

ACR Breast Density Category c: The breast tissue is heterogeneously
dense, which may obscure small masses.
FINDINGS: Bilateral retropectoral saline implants are intact and unchanged.

There are no findings suspicious for malignancy. Images were
processed with CAD.
IMPRESSION: No mammographic evidence of malignancy. A result letter of this
screening mammogram will be mailed directly to the patient.

RECOMMENDATION:
Screening mammogram in one year. (Code:Q6-D-649)

BI-RADS CATEGORY  1:  Negative.

## 2017-05-24 ENCOUNTER — Encounter: Payer: Self-pay | Admitting: Rehabilitative and Restorative Service Providers"

## 2017-05-24 ENCOUNTER — Ambulatory Visit (INDEPENDENT_AMBULATORY_CARE_PROVIDER_SITE_OTHER): Payer: 59 | Admitting: Rehabilitative and Restorative Service Providers"

## 2017-05-24 DIAGNOSIS — G8929 Other chronic pain: Secondary | ICD-10-CM

## 2017-05-24 DIAGNOSIS — M545 Low back pain: Secondary | ICD-10-CM | POA: Diagnosis not present

## 2017-05-24 DIAGNOSIS — M6281 Muscle weakness (generalized): Secondary | ICD-10-CM | POA: Diagnosis not present

## 2017-05-24 DIAGNOSIS — M62830 Muscle spasm of back: Secondary | ICD-10-CM | POA: Diagnosis not present

## 2017-05-24 NOTE — Therapy (Addendum)
[facility]  [address] [place], [place], [address] Phone: [phone]   Fax:  [phone]  Physical Therapy Treatment  Patient Details  Name: [name] MRN: [id number] Date of Birth: 1956-08-08 Referring Provider: Dr Georgina Snell  Encounter Date: 05/24/2017      PT End of Session - 05/24/17 1524    Visit Number 8   Number of Visits 18   Date for PT Re-Evaluation 06/21/17   PT Start Time 7616   PT Stop Time 1612   PT Time Calculation (min) 56 min   Activity Tolerance Patient tolerated treatment well      Past Medical History:  Diagnosis Date  . Anxiety   . Arthritis    Right Hip  . Breast disorder     Rt Breast Calcifications  . Chronic cystitis   . Depression   . Dyspareunia   . Hypercholesterolemia   . Hypothyroidism   . Plantar fasciitis of left foot    2013  . Torn meniscus 11/13/15   Rt knee    Past Surgical History:  Procedure Laterality Date  . BREAST ENHANCEMENT SURGERY    . KNEE SURGERY Right 11/16  . plastic surgry to eyes    . Scoliosis repair with Harrington Rods      There were no vitals filed for this visit.      Subjective Assessment - 05/24/17 1527    Subjective Continues to feel better with less LBP. Has not returned to work out with her trainer but plans to work with her trainer Friday. Sees Dr Georgina Snell Friday as well.    Currently in Pain? No/denies            [facility] PT Assessment - 05/24/17 0001      Assessment   Medical Diagnosis Lumbar spondylosis   Referring Provider Dr Georgina Snell   Onset Date/Surgical Date 03/23/07   Hand Dominance Left   Next MD Visit 05/28/17     Strength   Right Hip Flexion 5/5   Right Hip Extension --  5-/5   Right Hip ABduction 4+/5   Left Hip Flexion 5/5   Left Hip Extension 5/5   Left Hip ABduction 5/5                     [facility] Adult PT Treatment/Exercise - 05/24/17 0001      Lumbar Exercises: Stretches   Passive Hamstring  Stretch 2 reps;30 seconds   Single Knee to Chest Stretch 2 reps;10 seconds     Lumbar Exercises: Aerobic   Stationary Bike L3 x 7 min      Lumbar Exercises: Standing   Row Strengthening;Right;Left;20 reps  core engaged   Theraband Level (Row) Level 3 (Green)   Row Limitations step back w/ row w/ green TB x 10 each    Shoulder Extension Strengthening;Right;Left;20 reps  core engaged   Theraband Level (Shoulder Extension) Level 3 (Green)   Other Standing Lumbar Exercises hip extension 10 x 2 set; hip abduction 10 x 2 sets core engaged    Other Standing Lumbar Exercises step back with green TB - bow and arrow with green TB x 10 each UE      Lumbar Exercises: Seated   Sit to Stand 10 reps  core engaged      Lumbar Exercises: Supine   Ab Set --  3 part core 10-15 sec x 10    Clam 10 reps  each leg; with ab set green TB  Bridge 20 reps;3 seconds  green TB with isometric hold in abduction    Large Ball Abdominal Isometric 10 reps  overhead x10; ball btn knees to overhead x10     Lumbar Exercises: Sidelying   Clam 3 seconds;20 reps  green TB distal thigh      Lumbar Exercises: Prone   Other Prone Lumbar Exercises 10 upper body lifts   Other Prone Lumbar Exercises pelvic press x 10; pelvic press with alt leg lift x 10 each LE alternating sides      Modalities   Modalities Cryotherapy     Cryotherapy   Number Minutes Cryotherapy 15 Minutes   Cryotherapy Location Lumbar Spine                  PT Short Term Goals - 04/09/17 1405      PT SHORT TERM GOAL #1   Title -   Time 0     PT SHORT TERM GOAL #2   Title -   Time 0     PT SHORT TERM GOAL #3   Title -   Time 0           PT Long Term Goals - 05/24/17 1525      PT LONG TERM GOAL #1   Title Patient I in advanced HEP (06/21/17)    Time 12   Period Weeks   Status On-going     PT LONG TERM GOAL #2   Title report =/> 50% reduction in LBP with walking to not need medication as much ( 06/21/17)     Time 12   Period Weeks   Status On-going     PT LONG TERM GOAL #3   Title Increased bilat hip strength to 5-/5 (06/21/17)   Time 12   Period Weeks   Status On-going     PT LONG TERM GOAL #4   Title demo strong equal contraction of the multifidi and TA ( 06/21/17)   Time 12   Period Weeks   Status On-going     PT LONG TERM GOAL #5   Title Improve FOTO to </= 38% limitation (06/21/17)   Time 12   Period Weeks   Status On-going               Plan - 05/24/17 1526    Clinical Impression Statement [name] reports improved pain level in LB. She does not do her exercises at home. and has not yet returned to work with her trainer (plans to begin Friday). Continues to work toward goals of therapy. Demonstrates increased strength through core and LE's. She needs to exercise on a regular basis to achieve maximum benefit from therapy and improve functional outcomes.    Rehab Potential Good   PT Frequency 2x / week   PT Duration 6 weeks   PT Treatment/Interventions Moist Heat;Ultrasound;Traction;Therapeutic exercise;Dry needling;Manual techniques;Neuromuscular re-education;Cryotherapy;Iontophoresis '4mg'$ /ml Dexamethasone;Electrical Stimulation;Patient/family education   PT Next Visit Plan Continue to progress spinal stabilization / core strengthening.  Encouraging patient to resume work with her Physiological scientist.    Consulted and Agree with Plan of Care Patient      Patient will benefit from skilled therapeutic intervention in order to improve the following deficits and impairments:  Decreased strength, Pain, Increased muscle spasms, Improper body mechanics  Visit Diagnosis: Chronic bilateral low back pain without sciatica  Muscle spasm of back  Muscle weakness (generalized)     Problem List Patient Active Problem List   Diagnosis Date Noted  .  Abnormal ear sensation 07/28/2016  . Hearing loss 07/28/2016  . Loose stools 07/26/2015  . Trapezius strain 07/26/2015  . Right knee  meniscal tear 05/10/2015  . Lumbar spondylosis 05/10/2015  . Postmenopausal atrophic vaginitis 12/18/2014  . COPD, mild ([place]) 03/30/2014  . Generalized anxiety disorder 01/14/2012  . HEARING DEFICIT 05/29/2011  . PANIC DISORDER 08/12/2010  . LOW BACK PAIN, CHRONIC 07/16/2010  . PELVIC FRACTURE 04/15/2010  . DECREASED LIBIDO 06/26/2009  . Major depressive disorder, recurrent episode, severe ([place]) 12/03/2008  . SCOLIOSIS 11/06/2008  . Osteopenia 09/25/2008  . POSTMENOPAUSAL STATUS 09/11/2008  . Hyperlipidemia 05/28/2008  . ARTHRITIS, HIPS, BILATERAL 02/21/2008  . APHTHOUS ULCERS 11/11/2007  . Hypothyroidism 10/05/2007  . Recurrent urinary tract infection 07/18/2007  . SACROILIITIS, RIGHT 07/18/2007     Paone Nilda Simmer PT, MPH  05/24/2017, 4:09 PM  [facility] [address] [place], [place], [address] Phone: [phone]   Fax:  [phone]  Name: [name] MRN: [id number] Date of Birth: [birth year]  PHYSICAL THERAPY DISCHARGE SUMMARY  Visits from Start of Care: 8  Current functional level related to goals / functional outcomes:unknown at this time   Remaining deficits: unknown   Education / Equipment: HEP Plan: Patient agrees to discharge.  Patient goals were not met. Patient is being discharged due to being pleased with the current functional level.  ?????Pt had follow up with MD after this visit and told hiim she was happy with her function.  She has not returned for therapy, will discharge to HEP and personal trainer.     Jeral Pinch, PT 06/10/17 3:23 PM

## 2017-05-28 ENCOUNTER — Ambulatory Visit (INDEPENDENT_AMBULATORY_CARE_PROVIDER_SITE_OTHER): Payer: 59 | Admitting: Family Medicine

## 2017-05-28 ENCOUNTER — Encounter: Payer: Self-pay | Admitting: Family Medicine

## 2017-05-28 ENCOUNTER — Encounter: Payer: 59 | Admitting: Physical Therapy

## 2017-05-28 ENCOUNTER — Ambulatory Visit: Payer: 59 | Admitting: Family Medicine

## 2017-05-28 VITALS — BP 127/70 | HR 67 | Wt 143.0 lb

## 2017-05-28 DIAGNOSIS — M47816 Spondylosis without myelopathy or radiculopathy, lumbar region: Secondary | ICD-10-CM

## 2017-05-28 NOTE — Progress Notes (Signed)
[name] is a 61 y.o. female who presents to [facility] today for follow-up back pain. Patient was seen previously most recently March 23 for chronic low back pain. The pain had been treated with oxycodone and pain management. I ordered physical therapy which she has completed. She notes considerable but not complete resolution of her pain. She is quite happy with how well she is doing and like to talk with pain management about reducing the amount of opiates that she has. She denies any weakness or numbness distally.   Past Medical History:  Diagnosis Date  . Anxiety   . Arthritis    Right Hip  . Breast disorder     Rt Breast Calcifications  . Chronic cystitis   . Depression   . Dyspareunia   . Hypercholesterolemia   . Hypothyroidism   . Plantar fasciitis of left foot    2013  . Torn meniscus 11/13/15   Rt knee   Past Surgical History:  Procedure Laterality Date  . BREAST ENHANCEMENT SURGERY    . KNEE SURGERY Right 11/16  . plastic surgry to eyes    . Scoliosis repair with Harrington Rods     Social History  Substance Use Topics  . Smoking status: Former Smoker    Quit date: 09/20/1982  . Smokeless tobacco: Never Used  . Alcohol use 0.0 oz/week     Comment: Use is 1-2 a month.     ROS:  As above   Medications: Current Outpatient Prescriptions  Medication Sig Dispense Refill  . busPIRone (BUSPAR) 10 MG tablet Take 1 tablet (10 mg total) by mouth 2 (two) times daily. 60 tablet 1  . calcium-vitamin D (OSCAL WITH D) 500-200 MG-UNIT per tablet Take 2 tablets by mouth daily.     . clonazePAM (KLONOPIN) 0.5 MG tablet TAKE 1 TABLET BY MOUTH EVERY DAY AS NEEDED 90 tablet 0  . FLUoxetine (PROZAC) 20 MG capsule Take 1 capsule (20 mg total) by mouth daily. 30 capsule 1  . lamoTRIgine (LAMICTAL) 25 MG tablet Take 2 tablets (50 mg total) by mouth daily. 180 tablet 0  . levothyroxine (SYNTHROID, LEVOTHROID) 88 MCG tablet TAKE 1  TABLET (88 MCG TOTAL) BY MOUTH DAILY BEFORE BREAKFAST. 90 tablet 2  . Multiple Vitamin (MULTIVITAMIN) tablet Take 1 tablet by mouth daily.      . nitrofurantoin (MACRODANTIN) 50 MG capsule Take 1 capsule (50 mg total) by mouth daily as needed. Due for follow up visit 30 capsule 0  . oxyCODONE-acetaminophen (PERCOCET) 10-325 MG tablet     . phenylephrine (NASAL DECONGESTANT PE) 10 MG TABS tablet Take 10 mg by mouth every 4 (four) hours as needed.    . valACYclovir (VALTREX) 500 MG tablet TAKE 1 TABLET (500 MG TOTAL) BY MOUTH DAILY AS NEEDED. 90 tablet 1   No current facility-administered medications for this visit.    Allergies  Allergen Reactions  . Bupropion Hcl Other (See Comments)    irritabillity  . Sulfa Antibiotics Itching     Exam:  BP 127/70   Pulse 67   Wt 143 lb (64.9 kg)   BMI 21.74 kg/m  General: Well Developed, well nourished, and in no acute distress.  Neuro/Psych: Alert and oriented x3, extra-ocular muscles intact, able to move all 4 extremities, sensation grossly intact. Skin: Warm and dry, no rashes noted.  Respiratory: Not using accessory muscles, speaking in full sentences, trachea midline.  Cardiovascular: Pulses palpable, no extremity edema. Abdomen: Does not  appear distended. MSK: Back: Normal motion and gait.    No results found for this or any previous visit (from the past 48 hour(s)). No results found.    Assessment and Plan: 61 y.o. female with back pain doing much better. Continue home physical therapy activities. Resume activity with personal trainer. Recheck as needed.    No orders of the defined types were placed in this encounter.  No orders of the defined types were placed in this encounter.   Discussed warning signs or symptoms. Please see discharge instructions. Patient expresses understanding.

## 2017-05-28 NOTE — Patient Instructions (Signed)
Thank you for coming in today. Continue the home exercises.  Work with Research officer, trade unionyour trainer.  Return as needed for musculoskeletal complaints with me.

## 2017-05-31 ENCOUNTER — Ambulatory Visit (INDEPENDENT_AMBULATORY_CARE_PROVIDER_SITE_OTHER): Payer: 59 | Admitting: Psychology

## 2017-05-31 DIAGNOSIS — F324 Major depressive disorder, single episode, in partial remission: Secondary | ICD-10-CM

## 2017-05-31 DIAGNOSIS — F341 Dysthymic disorder: Secondary | ICD-10-CM | POA: Diagnosis not present

## 2017-06-04 NOTE — Telephone Encounter (Signed)
Error

## 2017-06-07 ENCOUNTER — Telehealth (HOSPITAL_COMMUNITY): Payer: Self-pay | Admitting: *Deleted

## 2017-06-07 NOTE — Telephone Encounter (Signed)
Patient would like to discuss a medication change. Patient would like to try Abilify. Informed patient medication changes are conducted during the time of visit. Patient's next visit is schedule on 08/09/17. Patient states she will until next visit. Patient verbalizes understanding.

## 2017-06-24 ENCOUNTER — Telehealth: Payer: Self-pay | Admitting: *Deleted

## 2017-06-24 NOTE — Telephone Encounter (Signed)
Patient called and left a message stating she has a UTI and the antibiotic that she has on hand does not work. Left patient another message to call back and schedule an appointment

## 2017-06-25 ENCOUNTER — Ambulatory Visit (INDEPENDENT_AMBULATORY_CARE_PROVIDER_SITE_OTHER): Payer: 59 | Admitting: Sports Medicine

## 2017-06-25 ENCOUNTER — Encounter: Payer: Self-pay | Admitting: Sports Medicine

## 2017-06-25 VITALS — BP 128/84 | HR 100 | Temp 98.8°F | Wt 138.0 lb

## 2017-06-25 DIAGNOSIS — N3001 Acute cystitis with hematuria: Secondary | ICD-10-CM

## 2017-06-25 DIAGNOSIS — R300 Dysuria: Secondary | ICD-10-CM | POA: Diagnosis not present

## 2017-06-25 DIAGNOSIS — N3000 Acute cystitis without hematuria: Secondary | ICD-10-CM | POA: Insufficient documentation

## 2017-06-25 LAB — POCT URINALYSIS DIPSTICK
Bilirubin, UA: NEGATIVE
Glucose, UA: NEGATIVE
Ketones, UA: NEGATIVE
Protein, UA: NEGATIVE
Spec Grav, UA: <=1.005 — AB (ref 1.010–1.025)
Urobilinogen, UA: 0.2 U/dL
pH, UA: 5.5 (ref 5.0–8.0)

## 2017-06-25 MED ORDER — PHENAZOPYRIDINE HCL 200 MG PO TABS: 200.0000 mg | ORAL_TABLET | Freq: Three times a day (TID) | ORAL | 0 refills | Status: AC

## 2017-06-25 MED ORDER — CEFUROXIME AXETIL 500 MG PO TABS: 500.0000 mg | ORAL_TABLET | Freq: Two times a day (BID) | ORAL | 0 refills | Status: DC

## 2017-06-25 NOTE — Assessment & Plan Note (Signed)
Starting Ceftin/Pyridium, return in 2 weeks to recheck urinalysis to make sure hematuria has cleared. She does get frequent urinary tract infections and is treated with nitrofurantoin, I think she may have become resistant to this.

## 2017-06-25 NOTE — Progress Notes (Signed)
  Subjective:    CC: Dysuria  HPI: This is a pleasant 61 year old female, for the past couple of weeks she's had increasing dysuria, she tried nitrofurantoin without much improvement. No flank pain, no constitutional symptoms, no GI symptoms, no constipation. No skin rash, moderate, persistent.  Past medical history:  Negative.  See flowsheet/record as well for more information.  Surgical history: Negative.  See flowsheet/record as well for more information.  Family history: Negative.  See flowsheet/record as well for more information.  Social history: Negative.  See flowsheet/record as well for more information.  Allergies, and medications have been entered into the medical record, reviewed, and no changes needed.   Review of Systems: No fevers, chills, night sweats, weight loss, chest pain, or shortness of breath.   Objective:    General: Well Developed, well nourished, and in no acute distress.  Neuro: Alert and oriented x3, extra-ocular muscles intact, sensation grossly intact.  HEENT: Normocephalic, atraumatic, pupils equal round reactive to light, neck supple, no masses, no lymphadenopathy, thyroid nonpalpable.  Skin: Warm and dry, no rashes. Cardiac: Regular rate and rhythm, no murmurs rubs or gallops, no lower extremity edema.  Respiratory: Clear to auscultation bilaterally. Not using accessory muscles, speaking in full sentences. Abdomen: Soft and the superpubic region, nondistended, normal bowel sounds, no costovertebral angle pain, no guarding, rigidity, rebound tenderness.  Urinalysis is positive for leukocytes, blood, nitrites.  Impression and Recommendations:    Acute cystitis Starting Ceftin/Pyridium, return in 2 weeks to recheck urinalysis to make sure hematuria has cleared. She does get frequent urinary tract infections and is treated with nitrofurantoin, I think she may have become resistant to this.  I spent 25 minutes with this patient, greater than 50% was  face-to-face time counseling regarding the above diagnoses

## 2017-06-28 LAB — URINE CULTURE

## 2017-06-29 ENCOUNTER — Telehealth: Payer: Self-pay | Admitting: *Deleted

## 2017-06-29 MED ORDER — IBUPROFEN 800 MG PO TABS: 800.0000 mg | ORAL_TABLET | Freq: Three times a day (TID) | ORAL | 2 refills | Status: DC | PRN

## 2017-06-29 NOTE — Telephone Encounter (Signed)
Ibuprofen sent to pharmacy.

## 2017-06-29 NOTE — Telephone Encounter (Signed)
Patient called and states he pain management doctor will not prescribe to her Ibuprofen 800 mg to take in addition hydrocodone. She feels the Ibuprofen will help with her pain. She is requesting Dr. Denyse Amassorey to write a prescription for hydrocodone and send it to [facility] in [place].

## 2017-06-30 NOTE — Telephone Encounter (Signed)
Message left on vm 

## 2017-07-05 ENCOUNTER — Telehealth: Payer: Self-pay

## 2017-07-05 NOTE — Telephone Encounter (Signed)
Still having urination problems. Schedule patient with Dr Denyse Amassorey on the 17 th.

## 2017-07-06 ENCOUNTER — Ambulatory Visit (INDEPENDENT_AMBULATORY_CARE_PROVIDER_SITE_OTHER): Payer: 59 | Admitting: Family Medicine

## 2017-07-06 ENCOUNTER — Encounter: Payer: Self-pay | Admitting: Family Medicine

## 2017-07-06 VITALS — BP 119/75 | HR 69 | Temp 98.3°F | Ht 68.0 in | Wt 142.0 lb

## 2017-07-06 DIAGNOSIS — R82998 Other abnormal findings in urine: Secondary | ICD-10-CM

## 2017-07-06 DIAGNOSIS — R300 Dysuria: Secondary | ICD-10-CM | POA: Diagnosis not present

## 2017-07-06 DIAGNOSIS — R8299 Other abnormal findings in urine: Secondary | ICD-10-CM

## 2017-07-06 DIAGNOSIS — R319 Hematuria, unspecified: Secondary | ICD-10-CM

## 2017-07-06 LAB — POCT URINALYSIS DIPSTICK
BILIRUBIN UA: NEGATIVE
Glucose, UA: NEGATIVE
KETONES UA: NEGATIVE
Nitrite, UA: NEGATIVE
PH UA: 5.5 (ref 5.0–8.0)
Protein, UA: NEGATIVE
SPEC GRAV UA: 1.01 (ref 1.010–1.025)
Urobilinogen, UA: 0.2 E.U./dL

## 2017-07-06 MED ORDER — CIPROFLOXACIN HCL 500 MG PO TABS: 500.0000 mg | ORAL_TABLET | Freq: Two times a day (BID) | ORAL | 0 refills | Status: DC

## 2017-07-06 NOTE — Patient Instructions (Signed)
Thank you for coming in today. Take cipro for UTI.  You should better soon.  We can decide if it is reasonable to have a standing prescription.    Urinary Frequency, Adult Urinary frequency means urinating more often than usual. People with urinary frequency urinate at least 8 times in 24 hours, even if they drink a normal amount of fluid. Although they urinate more often than normal, the total amount of urine produced in a day may be normal. Urinary frequency is also called pollakiuria. What are the causes? This condition may be caused by:  A urinary tract infection.  Obesity.  Bladder problems, such as bladder stones.  Caffeine or alcohol.  Eating food or drinking fluids that irritate the bladder. These include coffee, tea, soda, artificial sweeteners, citrus, tomato-based foods, and chocolate.  Certain medicines, such as medicines that help the body get rid of extra fluid (diuretics).  Muscle or nerve weakness.  Overactive bladder.  Chronic diabetes.  Interstitial cystitis.  In men, problems with the prostate, such as an enlarged prostate.  In women, pregnancy.  In some cases, the cause may not be known. What increases the risk? This condition is more likely to develop in:  Women who have gone through menopause.  Men with prostate problems.  People with a disease or injury that affects the nerves or spinal cord.  People who have or have had a condition that affects the brain, such as a stroke.  What are the signs or symptoms? Symptoms of this condition include:  Feeling an urgent need to urinate often. The stress and anxiety of needing to find a bathroom quickly can make this urge worse.  Urinating 8 or more times in 24 hours.  Urinating as often as every 1 to 2 hours.  How is this diagnosed? This condition is diagnosed based on your symptoms, your medical history, and a physical exam. You may have tests, such as:  Blood tests.  Urine tests.  Imaging  tests, such as X-rays or ultrasounds.  A bladder test.  A test of your neurological system. This is the body system that senses the need to urinate.  A test to check for problems in the urethra and bladder called cystoscopy.  You may also be asked to keep a bladder diary. A bladder diary is a record of what you eat and drink, how often you urinate, and how much you urinate. You may need to see a health care provider who specializes in conditions of the urinary tract (urologist) or kidneys (nephrologist). How is this treated? Treatment for this condition depends on the cause. Sometimes the condition goes away on its own and treatment is not necessary. If treatment is needed, it may include:  Taking medicine.  Learning exercises that strengthen the muscles that help control urination.  Following a bladder training program. This may include: ? Learning to delay going to the bathroom. ? Double urinating (voiding). This helps if you are not completely emptying your bladder. ? Scheduled voiding.  Making diet changes, such as: ? Avoiding caffeine. ? Drinking fewer fluids, especially alcohol. ? Not drinking in the evening. ? Not having foods or drinks that may irritate the bladder. ? Eating foods that help prevent or ease constipation. Constipation can make this condition worse.  Having the nerves in your bladder stimulated. There are two options for stimulating the nerves to your bladder: ? Outpatient electrical nerve stimulation. This is done by your health care provider. ? Surgery to implant a bladder pacemaker.  The pacemaker helps to control the urge to urinate.  Follow these instructions at home:  Keep a bladder diary if told to by your health care provider.  Take over-the-counter and prescription medicines only as told by your health care provider.  Do any exercises as told by your health care provider.  Follow a bladder training program as told by your health care  provider.  Make any recommended diet changes.  Keep all follow-up visits as told by your health care provider. This is important. Contact a health care provider if:  You start urinating more often.  You feel pain or irritation when you urinate.  You notice blood in your urine.  Your urine looks cloudy.  You develop a fever.  You begin vomiting. Get help right away if:  You are unable to urinate. This information is not intended to replace advice given to you by your health care provider. Make sure you discuss any questions you have with your health care provider. Document Released: 10/03/2009 Document Revised: 01/08/2016 Document Reviewed: 07/03/2015 Elsevier Interactive Patient Education  [employer]2018 [employer] Inc.

## 2017-07-07 NOTE — Progress Notes (Signed)
[name] is a 61 y.o. female who [facility] today for dysuria. Patient has cloudy urine urinary frequency urgency and dysuria. This is consistent with episodes of urinary tract infection. Patient was seen by my partner on July 6. She was treated with second generation cephalosporins and nitrofurantoin. A urine culture subsequently grew Pseudomonas resistant to ceftriaxone but sensitive to Cipro. She denies any abdominal pain fevers or chills.   Past Medical History:  Diagnosis Date  . Anxiety   . Arthritis    Right Hip  . Breast disorder     Rt Breast Calcifications  . Chronic cystitis   . Depression   . Dyspareunia   . Hypercholesterolemia   . Hypothyroidism   . Plantar fasciitis of left foot    2013  . Torn meniscus 11/13/15   Rt knee   Past Surgical History:  Procedure Laterality Date  . BREAST ENHANCEMENT SURGERY    . KNEE SURGERY Right 11/16  . plastic surgry to eyes    . Scoliosis repair with Harrington Rods     Social History  Substance Use Topics  . Smoking status: Former Smoker    Quit date: 09/20/1982  . Smokeless tobacco: Never Used  . Alcohol use 0.0 oz/week     Comment: Use is 1-2 a month.   family history includes Alcohol abuse in her father; Aneurysm in her unknown relative; Anxiety disorder in her brother and mother; COPD in her father; Dementia in her maternal grandfather; Depression in her brother and mother; Drug abuse in her brother; Heart murmur in her father.  ROS as above:  Medications: Current Outpatient Prescriptions  Medication Sig Dispense Refill  . busPIRone (BUSPAR) 10 MG tablet Take 1 tablet (10 mg total) by mouth 2 (two) times daily. 60 tablet 1  . calcium-vitamin D (OSCAL WITH D) 500-200 MG-UNIT per tablet Take 2 tablets by mouth daily.     . clonazePAM (KLONOPIN) 0.5 MG tablet TAKE 1 TABLET BY MOUTH  EVERY DAY AS NEEDED 90 tablet 0  . FLUoxetine (PROZAC) 20 MG capsule Take 1 capsule (20 mg total) by mouth daily. 30 capsule 1  . HYDROcodone-acetaminophen (NORCO) 10-325 MG tablet Take 1 tablet by mouth 4 (four) times daily as needed.    Marland Kitchen. ibuprofen (ADVIL,MOTRIN) 800 MG tablet Take 1 tablet (800 mg total) by mouth every 8 (eight) hours as needed. 60 tablet 2  . lamoTRIgine (LAMICTAL) 25 MG tablet Take 2 tablets (50 mg total) by mouth daily. 180 tablet 0  . levothyroxine (SYNTHROID, LEVOTHROID) 88 MCG tablet TAKE 1 TABLET (88 MCG TOTAL) BY MOUTH DAILY BEFORE BREAKFAST. 90 tablet 2  . Multiple Vitamin (MULTIVITAMIN) tablet Take 1 tablet by mouth daily.      . phenylephrine (NASAL DECONGESTANT PE) 10 MG TABS tablet Take 10 mg by mouth every 4 (four) hours as needed.    . valACYclovir (VALTREX) 500 MG tablet TAKE 1 TABLET (500 MG TOTAL) BY MOUTH DAILY AS NEEDED. 90 tablet 1  . ciprofloxacin (CIPRO) 500 MG tablet Take 1 tablet (500 mg total) by mouth 2 (two) times daily. 14 tablet 0   No current facility-administered medications for this visit.    Allergies  Allergen Reactions  . Bupropion Hcl Other (See Comments)    irritabillity  . Sulfa Antibiotics Itching    Health Maintenance Health Maintenance  Topic Date Due  . Hepatitis C Screening  12-09-1956  . HIV Screening  09/07/1970  . INFLUENZA VACCINE  11/27/2017 (Originally 07/21/2017)  . MAMMOGRAM  01/01/2018  . PAP SMEAR  01/05/2019  . COLONOSCOPY  10/08/2021  . TETANUS/TDAP  06/24/2022     Exam:  BP 119/75   Pulse 69   Temp 98.3 F (36.8 C)   Ht 5\' 8"  (1.727 m)   Wt 142 lb (64.4 kg)   BMI 21.59 kg/m  Gen: Well NAD HEENT: EOMI,  MMM Lungs: Normal work of breathing. CTABL Heart: RRR no MRG Abd: NABS, Soft. Nondistended, Nontender Exts: Brisk capillary refill, warm and well perfused.    Results for orders placed or performed in visit on 07/06/17 (from the past 72 hour(s))  POCT urinalysis dipstick     Status: Abnormal    Collection Time: 07/06/17  4:35 PM  Result Value Ref Range   Color, UA light yellow    Clarity, UA cloudy    Glucose, UA neg    Bilirubin, UA neg    Ketones, UA neg    Spec Grav, UA 1.010 1.010 - 1.025   Blood, UA small    pH, UA 5.5 5.0 - 8.0   Protein, UA neg    Urobilinogen, UA 0.2 0.2 or 1.0 E.U./dL   Nitrite, UA neg    Leukocytes, UA Large (3+) (A) Negative   No results found.    Assessment and Plan: 61 y.o. female with Urinary tract infection. Empiric treatment with Cipro based on culture results from previous infection. Will obtain a new culture as well.   Orders Placed This Encounter  Procedures  . Urine Culture  . POCT urinalysis dipstick   Meds ordered this encounter  Medications  . HYDROcodone-acetaminophen (NORCO) 10-325 MG tablet    Sig: Take 1 tablet by mouth 4 (four) times daily as needed.  . ciprofloxacin (CIPRO) 500 MG tablet    Sig: Take 1 tablet (500 mg total) by mouth 2 (two) times daily.    Dispense:  14 tablet    Refill:  0     Discussed warning signs or symptoms. Please see discharge instructions. Patient expresses understanding.

## 2017-07-09 ENCOUNTER — Ambulatory Visit: Payer: Self-pay | Admitting: Sports Medicine

## 2017-07-09 LAB — URINE CULTURE

## 2017-07-10 ENCOUNTER — Other Ambulatory Visit (HOSPITAL_COMMUNITY): Payer: Self-pay | Admitting: Psychiatry

## 2017-07-12 NOTE — Telephone Encounter (Signed)
Medication refill- received fax from [facility] requesting a refill for Buspar. Per Dr. Gilmore LarocheAkhtar, refill request is authorize for Buspar 10mg , #60. Rx was sent to pharmacy. Pt's next apt is schedule on 08/09/17. Lvm informing pt of refill status.

## 2017-07-30 ENCOUNTER — Ambulatory Visit (INDEPENDENT_AMBULATORY_CARE_PROVIDER_SITE_OTHER): Payer: 59 | Admitting: Psychiatry

## 2017-07-30 ENCOUNTER — Encounter (HOSPITAL_COMMUNITY): Payer: Self-pay | Admitting: Psychiatry

## 2017-07-30 DIAGNOSIS — Z634 Disappearance and death of family member: Secondary | ICD-10-CM | POA: Diagnosis not present

## 2017-07-30 DIAGNOSIS — Z813 Family history of other psychoactive substance abuse and dependence: Secondary | ICD-10-CM | POA: Diagnosis not present

## 2017-07-30 DIAGNOSIS — F331 Major depressive disorder, recurrent, moderate: Secondary | ICD-10-CM

## 2017-07-30 DIAGNOSIS — F4321 Adjustment disorder with depressed mood: Secondary | ICD-10-CM

## 2017-07-30 DIAGNOSIS — F4320 Adjustment disorder, unspecified: Secondary | ICD-10-CM | POA: Diagnosis not present

## 2017-07-30 DIAGNOSIS — Z818 Family history of other mental and behavioral disorders: Secondary | ICD-10-CM | POA: Diagnosis not present

## 2017-07-30 DIAGNOSIS — F419 Anxiety disorder, unspecified: Secondary | ICD-10-CM

## 2017-07-30 DIAGNOSIS — Z810 Family history of intellectual disabilities: Secondary | ICD-10-CM | POA: Diagnosis not present

## 2017-07-30 DIAGNOSIS — F0630 Mood disorder due to known physiological condition, unspecified: Secondary | ICD-10-CM

## 2017-07-30 DIAGNOSIS — F411 Generalized anxiety disorder: Secondary | ICD-10-CM | POA: Diagnosis not present

## 2017-07-30 MED ORDER — ARIPIPRAZOLE 5 MG PO TABS: 5.0000 mg | ORAL_TABLET | Freq: Every day | ORAL | 1 refills | Status: DC

## 2017-07-30 MED ORDER — FLUOXETINE HCL 20 MG PO CAPS: 20.0000 mg | ORAL_CAPSULE | Freq: Every day | ORAL | 1 refills | Status: DC

## 2017-07-30 MED ORDER — LAMOTRIGINE 25 MG PO TABS: 50.0000 mg | ORAL_TABLET | Freq: Every day | ORAL | 0 refills | Status: DC

## 2017-07-30 NOTE — Progress Notes (Signed)
Patient ID: [name], female   DOB: 20-Aug-1956, 61 y.o.   MRN: [id number]   [facility] Follow-up Outpatient Visit  Brytni Dray 01-Aug-1956  Date: 07/30/2017  Chief Complaint:  Follow Up. Depression and anxiety  History of Chief Complaint:   HPI Comments: [name] is a 61 y/o female with a past psychiatric history significant for Major Depression, Recurrent severe, generalized anxiety and bereavement. The patient returns  for psychiatric services and medication management.   Her depression dates back to 2008 when she lost her mom and also her brother was using drugs.  Patient is somewhat concerned about her best friend for 40 years who has given her difficult time now she has moved back to [place]. She was preoccupied with that friendship so we provided supportive therapy she is going to write down a letter to her. Other than that she is tolerating medication she asked if we can add some medication for depression because she is feeling low down and depressed at times but not hopeless or suicidal anxiety fluctuates  Grief: better   Relationship is distant Concerned about friend  . Modifying factors: excersice  Review of Systems  Constitutional: Negative for fever.  Cardiovascular: Negative for chest pain.  Gastrointestinal: Negative for nausea.  Skin: Negative for rash.  Psychiatric/Behavioral: Positive for depression.     There were no vitals filed for this visit. Physical Exam  Vitals reviewed.  Constitutional: She appears well-developed and well-nourished. No distress.  Skin: She is not diaphoretic.     Past Medical History: Reviewed Past Medical History:  Diagnosis Date  . Anxiety   . Arthritis    Right Hip  . Breast disorder     Rt Breast Calcifications  . Chronic cystitis   . Depression   . Dyspareunia   . Hypercholesterolemia   . Hypothyroidism   . Plantar fasciitis of left foot    2013  . Torn meniscus 11/13/15   Rt knee    Allergies: Reviewed Allergies  Allergen Reactions  . Bupropion Hcl Other (See Comments)    irritabillity  . Sulfa Antibiotics Itching   Current Medications: Reviewed Current Outpatient Prescriptions on File Prior to Visit  Medication Sig Dispense Refill  . busPIRone (BUSPAR) 10 MG tablet TAKE 1 TABLET BY MOUTH TWICE A DAY 60 tablet 1  . calcium-vitamin D (OSCAL WITH D) 500-200 MG-UNIT per tablet Take 2 tablets by mouth daily.     . ciprofloxacin (CIPRO) 500 MG tablet Take 1 tablet (500 mg total) by mouth 2 (two) times daily. 14 tablet 0  . clonazePAM (KLONOPIN) 0.5 MG tablet TAKE 1 TABLET BY MOUTH EVERY DAY AS NEEDED 90 tablet 0  . HYDROcodone-acetaminophen (NORCO) 10-325 MG tablet Take 1 tablet by mouth 4 (four) times daily as needed.    Marland Kitchen ibuprofen (ADVIL,MOTRIN) 800 MG tablet Take 1 tablet (800 mg total) by mouth every 8 (eight) hours as needed. 60 tablet 2  . levothyroxine (SYNTHROID, LEVOTHROID) 88 MCG tablet TAKE 1 TABLET (88 MCG TOTAL) BY MOUTH DAILY BEFORE BREAKFAST. 90 tablet 2  . Multiple Vitamin (MULTIVITAMIN) tablet Take 1 tablet by mouth daily.      . phenylephrine (NASAL DECONGESTANT PE) 10 MG TABS tablet Take 10 mg by mouth every 4 (four) hours as needed.    . valACYclovir (VALTREX) 500 MG tablet TAKE 1 TABLET (500 MG TOTAL) BY MOUTH DAILY AS NEEDED. 90 tablet 1   No current facility-administered medications on file prior to visit.  Substance Abuse History in the last 12 months: Reviewed Social History  Substance Use Topics  . Smoking status: Former Smoker    Quit date: 09/20/1982  . Smokeless tobacco: Never Used  . Alcohol use 0.0 oz/week     Comment: Use is 1-2 a month.  Caffeine: 2 cups a day   Family History: Reviewed Family History   Problem  Relation  Age of Onset   .  COPD     .  Aneurysm     .  Anxiety disorder  Mother    .  Depression  Mother    .  Alcohol abuse  Father    .  Heart murmur  Father    .  Drug abuse  Brother    .  Anxiety  disorder  Brother    .  Depression  Brother    .  Dementia  Maternal Grandfather     Psychiatric Specialty Exam:  Objective: Appearance: Casual and Well Groomed   Eye Contact:: Good   Speech: Clear and Coherent and Normal Rate   Volume: Normal   Mood:  sad   Affect: congruent  Thought Process: Coherent, Logical and Loose   Orientation: Full   Thought Content: WDL   Suicidal Thoughts: No   Homicidal Thoughts: No   Judgement: Fair   Insight: Fair   Psychomotor Activity: Normal   Akathisia: No   Handed: Left   Memory: Immediate 3/3, recent: 1/3   . Assets: Communication Skills  Desire for Improvement  Financial Resources/Insurance  Housing  Leisure Time  Physical Health  Resilience  Transportation  Vocational/Educational    Laboratory/X-Ray  Psychological Evaluation(s)   NONE  NONE    Assessment:  AXIS I  Major Depression, Recurrent -. Generalized anxiety disorder. bereavement  AXIS II  No diagnosis   AXIS III  Past Medical History    Diagnosis  Date    .  Hypercholesterolemia     .  Arthritis       Right Hip    .  Hypothyroidism     .  Chronic cystitis     .  Decreased libido     .  Dyspareunia     .  Breast disorder       Rt Breast Calcifications    .  Depression     .  Anxiety     .  Plantar fasciitis of left foot       2013      AXIS IV  other psychosocial or environmental problems   AXIS V  64   Treatment Plan/Recommendations:   Plan of Care:  PLAN:  1. Affirm with the patient that the medications are taken as ordered. Patient  expressed understanding of how their medications were to be used.    Laboratory:    Psychotherapy: Therapy: brief supportive therapy provided.  Discussed psychosocial stressors in detail. More than 50% of the visit was spent on individual therapy/counseling.   Medications:  Continue  the following psychiatric medications as written prior to this appointment with the following changes:: continue following  1.  Depression:dyshtymic, add abilify 5mg . Reviewed side effects. contineu lamictal, prozac 2. Anxiety disorder; fluctuates. Continue prozac. Provided supportive therapy  3. Grief. Manageable.   FU 1 month. Reviewed concerns and addressed questions.             Thresa RossNadeem Angenette Daily, MD

## 2017-08-09 ENCOUNTER — Ambulatory Visit (HOSPITAL_COMMUNITY): Payer: 59 | Admitting: Psychiatry

## 2017-08-12 ENCOUNTER — Other Ambulatory Visit: Payer: Self-pay | Admitting: Family Medicine

## 2017-08-27 ENCOUNTER — Ambulatory Visit (HOSPITAL_COMMUNITY): Payer: Self-pay | Admitting: Psychiatry

## 2017-08-31 ENCOUNTER — Ambulatory Visit (HOSPITAL_COMMUNITY): Payer: Self-pay | Admitting: Psychiatry

## 2017-09-06 ENCOUNTER — Encounter: Payer: Self-pay | Admitting: Family Medicine

## 2017-09-06 ENCOUNTER — Ambulatory Visit (INDEPENDENT_AMBULATORY_CARE_PROVIDER_SITE_OTHER): Payer: 59 | Admitting: Family Medicine

## 2017-09-06 VITALS — BP 131/62 | HR 61 | Wt 141.0 lb

## 2017-09-06 DIAGNOSIS — R197 Diarrhea, unspecified: Secondary | ICD-10-CM | POA: Diagnosis not present

## 2017-09-06 MED ORDER — CIPROFLOXACIN HCL 500 MG PO TABS: 500.0000 mg | ORAL_TABLET | Freq: Two times a day (BID) | ORAL | 0 refills | Status: AC

## 2017-09-06 NOTE — Patient Instructions (Signed)
Take the cipro after you have had the samples of stool collected.

## 2017-09-06 NOTE — Progress Notes (Signed)
Subjective:    Patient ID: [name], female    DOB: 10-06-1956, 62 y.o.   MRN: [id number]  HPI 61 yo female comes in today complaining of diarrhea x 2 weeks. Marland Kitchen She wonders if she could be lactose intolerant. She has complained about this on and off since 2016.  She was evaluated for celiac disease with blood work in August 2016 and it was normal.anti-creatinine antibodies were normal as well. She did try cutting out dairy but doesn't seem to improver her sxs.  She is taking an OTC anti-diarrhea medications.  She says the stools are mushy, not so much watery though she has had incontinence with it. She is passing a lot of gas and says the stools are very foul-smelling. Interestingly-she just got back from a cruise on September 1. She actually started having a little bit of loose stool while on the cruise ship but it got worse over the last 5 days. She's had a little bit of blood when she wipes but no blood in the stool. No fevers or chills. She did sit in a Jacuzzi but has not been exposed to fresh water latex etc. Last colonoscopy was 10/09/2011.   Review of Systems  BP 131/62   Pulse 61   Wt 141 lb (64 kg)   SpO2 100%   BMI 21.44 kg/m     Allergies  Allergen Reactions  . Bupropion Hcl Other (See Comments)    irritabillity  . Sulfa Antibiotics Itching    Past Medical History:  Diagnosis Date  . Anxiety   . Arthritis    Right Hip  . Breast disorder     Rt Breast Calcifications  . Chronic cystitis   . Depression   . Dyspareunia   . Hypercholesterolemia   . Hypothyroidism   . Plantar fasciitis of left foot    2013  . Torn meniscus 11/13/15   Rt knee    Past Surgical History:  Procedure Laterality Date  . BREAST ENHANCEMENT SURGERY    . KNEE SURGERY Right 11/16  . plastic surgry to eyes    . Scoliosis repair with Harrington Rods      Social History   Social History  . Marital status: Married    Spouse name: N/A  . Number of children: N/A  . Years of  education: N/A   Occupational History  . CNA    Social History Main Topics  . Smoking status: Former Smoker    Quit date: 09/20/1982  . Smokeless tobacco: Never Used  . Alcohol use 0.0 oz/week     Comment: Use is 1-2 a month.  . Drug use: No  . Sexual activity: No   Other Topics Concern  . 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      Family History  Problem Relation Age of Onset  . Anxiety disorder Mother   . Depression Mother   . COPD Father        smoker  . Alcohol abuse Father   . Heart murmur Father   . Aneurysm Unknown   . Drug abuse Brother   . Anxiety disorder Brother   . Depression Brother   . Dementia Maternal Grandfather     Outpatient Encounter Prescriptions as of 09/06/2017  Medication Sig  . oxyCODONE-acetaminophen (PERCOCET) 10-325 MG tablet Take 1 tablet by mouth.  . busPIRone (BUSPAR) 10 MG tablet TAKE 1 TABLET BY MOUTH TWICE A DAY  .  calcium-vitamin D (OSCAL WITH D) 500-200 MG-UNIT per tablet Take 2 tablets by mouth daily.   . ciprofloxacin (CIPRO) 500 MG tablet Take 1 tablet (500 mg total) by mouth 2 (two) times daily.  . clonazePAM (KLONOPIN) 0.5 MG tablet TAKE 1 TABLET BY MOUTH EVERY DAY AS NEEDED  . FLUoxetine (PROZAC) 20 MG capsule Take 1 capsule (20 mg total) by mouth daily.  Marland Kitchen HYDROcodone-acetaminophen (NORCO) 10-325 MG tablet Take 1 tablet by mouth 4 (four) times daily as needed.  Marland Kitchen ibuprofen (ADVIL,MOTRIN) 800 MG tablet Take 1 tablet (800 mg total) by mouth every 8 (eight) hours as needed.  . lamoTRIgine (LAMICTAL) 25 MG tablet Take 2 tablets (50 mg total) by mouth daily.  Marland Kitchen levothyroxine (SYNTHROID, LEVOTHROID) 88 MCG tablet TAKE 1 TABLET (88 MCG TOTAL) BY MOUTH DAILY BEFORE BREAKFAST.  . Multiple Vitamin (MULTIVITAMIN) tablet Take 1 tablet by mouth daily.    . phenylephrine (NASAL DECONGESTANT PE) 10 MG TABS tablet Take 10 mg by mouth every 4 (four) hours as needed.  . valACYclovir (VALTREX) 500  MG tablet TAKE 1 TABLET (500 MG TOTAL) BY MOUTH DAILY AS NEEDED.  . [DISCONTINUED] ARIPiprazole (ABILIFY) 5 MG tablet Take 1 tablet (5 mg total) by mouth daily.  . [DISCONTINUED] ciprofloxacin (CIPRO) 500 MG tablet Take 1 tablet (500 mg total) by mouth 2 (two) times daily.   No facility-administered encounter medications on file as of 09/06/2017.          Objective:   Physical Exam  Constitutional: She is oriented to person, place, and time. She appears well-developed and well-nourished.  HENT:  Head: Normocephalic and atraumatic.  Cardiovascular: Normal rate, regular rhythm and normal heart sounds.   Pulmonary/Chest: Effort normal and breath sounds normal.  Abdominal: Soft. She exhibits no distension and no mass. There is no tenderness. There is no rebound and no guarding.  Hyperactive bowel sounds  Neurological: She is alert and oriented to person, place, and time.  Skin: Skin is warm and dry.  Psychiatric: She has a normal mood and affect. Her behavior is normal.      Assessment & Plan:  Acute diarrhea- will evaluate with stool cultures antithyroid for C. Difficile. She does work with elderly adults for a living and recently got back from a cruise all of these things could be a trigger. We'll also check her for milk allergy. She is Artie been tested for gluten intolerance in the past and it was negative.Imodium not working. Will treat with 4 days of Cipro after she collects her stool samples for possible traveler's diarrhea.Marland Kitchen

## 2017-09-07 ENCOUNTER — Telehealth: Payer: Self-pay | Admitting: *Deleted

## 2017-09-07 LAB — MILK COMPONENT PANEL
Allergen, Beta-lactoglob,f77: <0.1 kU/L
CLASS 761: 0
CLASS: 0
Class: 0

## 2017-09-07 LAB — ALLERGEN MILK: CLASS: 0

## 2017-09-07 LAB — CBC WITH DIFFERENTIAL/PLATELET
BASOS PCT: 0.6 %
Basophils Absolute: 49 cells/uL (ref 0–200)
Eosinophils Absolute: 131 cells/uL (ref 15–500)
Eosinophils Relative: 1.6 %
HEMATOCRIT: 33.3 % — AB (ref 35.0–45.0)
HEMOGLOBIN: 11 g/dL — AB (ref 11.7–15.5)
LYMPHS ABS: 2608 {cells}/uL (ref 850–3900)
MCH: 29.6 pg (ref 27.0–33.0)
MCHC: 33 g/dL (ref 32.0–36.0)
MCV: 89.5 fL (ref 80.0–100.0)
MPV: 9 fL (ref 7.5–12.5)
Monocytes Relative: 6.7 %
NEUTROS ABS: 4863 {cells}/uL (ref 1500–7800)
NEUTROS PCT: 59.3 %
Platelets: 341 10*3/uL (ref 140–400)
RBC: 3.72 10*6/uL — ABNORMAL LOW (ref 3.80–5.10)
RDW: 13 % (ref 11.0–15.0)
Total Lymphocyte: 31.8 %
WBC: 8.2 10*3/uL (ref 3.8–10.8)
WBCMIX: 549 {cells}/uL (ref 200–950)

## 2017-09-07 LAB — TEST AUTHORIZATION

## 2017-09-07 LAB — INTERPRETATION:

## 2017-09-07 LAB — VITAMIN B12: Vitamin B-12: 1321 pg/mL — ABNORMAL HIGH (ref 200–1100)

## 2017-09-07 LAB — FERRITIN: Ferritin: 68 ng/mL (ref 20–288)

## 2017-09-07 NOTE — Telephone Encounter (Signed)
Pt wanted to know since her labs came back for a mild allergy what does that mean? Is she not lactose intolerant. Could her sxs be caused by stress?

## 2017-09-08 NOTE — Telephone Encounter (Signed)
It means she is not allergic, but she could still have intolerance. As we get older we don't make as much of the enzymes that actually breaks down lactose which is the sugar in the mild products. This can lead to blaoting, gas and change in stools.  I would still recommend avoid these products until she feels better.  I want her to take the Ciporo for 4 dayas and then call us back and let us know if she is feeling better or not.

## 2017-09-08 NOTE — Telephone Encounter (Signed)
Called pt and someone picked up the phone and would not say anything. I hung up and called back and the same thing happened. Will try again later.[name] [place]

## 2017-09-09 ENCOUNTER — Other Ambulatory Visit: Payer: Self-pay | Admitting: Family Medicine

## 2017-09-10 ENCOUNTER — Encounter: Payer: Self-pay | Admitting: Family Medicine

## 2017-09-10 ENCOUNTER — Ambulatory Visit (INDEPENDENT_AMBULATORY_CARE_PROVIDER_SITE_OTHER): Payer: 59 | Admitting: Family Medicine

## 2017-09-10 VITALS — BP 143/66 | HR 55

## 2017-09-10 DIAGNOSIS — R197 Diarrhea, unspecified: Secondary | ICD-10-CM

## 2017-09-10 DIAGNOSIS — J019 Acute sinusitis, unspecified: Secondary | ICD-10-CM | POA: Diagnosis not present

## 2017-09-10 MED ORDER — AMOXICILLIN-POT CLAVULANATE 875-125 MG PO TABS: 1.0000 | ORAL_TABLET | Freq: Two times a day (BID) | ORAL | 0 refills | Status: DC

## 2017-09-10 NOTE — Progress Notes (Signed)
Subjective:    Patient ID: [name], female    DOB: [birth year], 61 y.o.   MRN: [id number]  HPI 61 year old female comes in today complaining of upper respiratory symptoms for about 2 weeks. She's had significant nasal congestion, discharge from the corners of the eyes, eye redness, and cough predominantly at night. She said she's getting up some yellowish phlegm. She has pain and pressure over the nasal bridge bilaterally. Check she feels like her energy levels have been okay. No fevers chills or sweats. She has been having some intermittent frontal headaches as well.  Persistent diarrhea but hasn't been able to collect a stool culture yet. But plans to and drop that off. She did have some questions about her recent blood work as well.   Review of Systems   BP (!) 143/66   Pulse (!) 55   SpO2 99%     Allergies  Allergen Reactions  . Bupropion Hcl Other (See Comments)    irritabillity  . Sulfa Antibiotics Itching    Past Medical History:  Diagnosis Date  . Anxiety   . Arthritis    Right Hip  . Breast disorder     Rt Breast Calcifications  . Chronic cystitis   . Depression   . Dyspareunia   . Hypercholesterolemia   . Hypothyroidism   . Plantar fasciitis of left foot    2013  . Torn meniscus 11/13/15   Rt knee    Past Surgical History:  Procedure Laterality Date  . BREAST ENHANCEMENT SURGERY    . KNEE SURGERY Right 11/16  . plastic surgry to eyes    . Scoliosis repair with Harrington Rods      Social History   Social History  . Marital status: Married    Spouse name: N/A  . Number of children: N/A  . Years of education: N/A   Occupational History  . CNA    Social History Main Topics  . Smoking status: Former Smoker    Quit date: 09/20/1982  . Smokeless tobacco: Never Used  . Alcohol use 0.0 oz/week     Comment: Use is 1-2 a month.  . Drug use: No  . Sexual activity: No   Other Topics Concern  . Not on file   Social History Narrative   She  works in indepednet living with seniors.      Family History  Problem Relation Age of Onset  . Anxiety disorder Mother   . Depression Mother   . COPD Father        smoker  . Alcohol abuse Father   . Heart murmur Father   . Aneurysm Unknown   . Drug abuse Brother   . Anxiety disorder Brother   . Depression Brother   . Dementia Maternal Grandfather     Outpatient Encounter Prescriptions as of 09/10/2017  Medication Sig  . busPIRone (BUSPAR) 10 MG tablet TAKE 1 TABLET BY MOUTH TWICE A DAY  . calcium-vitamin D (OSCAL WITH D) 500-200 MG-UNIT per tablet Take 2 tablets by mouth daily.   . clonazePAM (KLONOPIN) 0.5 MG tablet TAKE 1 TABLET BY MOUTH EVERY DAY AS NEEDED  . FLUoxetine (PROZAC) 20 MG capsule Take 1 capsule (20 mg total) by mouth daily.  Marland Kitchen HYDROcodone-acetaminophen (NORCO) 10-325 MG tablet Take 1 tablet by mouth 4 (four) times daily as needed.  Marland Kitchen ibuprofen (ADVIL,MOTRIN) 800 MG tablet Take 1 tablet (800 mg total) by mouth every 8 (eight) hours as needed.  . lamoTRIgine (LAMICTAL) 25 MG  tablet Take 2 tablets (50 mg total) by mouth daily.  Marland Kitchen levothyroxine (SYNTHROID, LEVOTHROID) 88 MCG tablet TAKE 1 TABLET (88 MCG TOTAL) BY MOUTH DAILY BEFORE BREAKFAST.  . Multiple Vitamin (MULTIVITAMIN) tablet Take 1 tablet by mouth daily.    Marland Kitchen oxyCODONE-acetaminophen (PERCOCET) 10-325 MG tablet Take 1 tablet by mouth.  . phenylephrine (NASAL DECONGESTANT PE) 10 MG TABS tablet Take 10 mg by mouth every 4 (four) hours as needed.  . valACYclovir (VALTREX) 500 MG tablet TAKE 1 TABLET (500 MG TOTAL) BY MOUTH DAILY AS NEEDED.  Marland Kitchen amoxicillin-clavulanate (AUGMENTIN) 875-125 MG tablet Take 1 tablet by mouth 2 (two) times daily.   No facility-administered encounter medications on file as of 09/10/2017.          Objective:   Physical Exam  Constitutional: She is oriented to person, place, and time. She appears well-developed and well-nourished.  HENT:  Head: Normocephalic and atraumatic.  Right  Ear: External ear normal.  Left Ear: External ear normal.  Nose: Nose normal.  Mouth/Throat: Oropharynx is clear and moist.  TMs and canals are clear.   Eyes: Pupils are equal, round, and reactive to light. Conjunctivae and EOM are normal.  Neck: Neck supple. No thyromegaly present.  Cardiovascular: Normal rate, regular rhythm and normal heart sounds.   Pulmonary/Chest: Effort normal and breath sounds normal. She has no wheezes.  Lymphadenopathy:    She has no cervical adenopathy.  Neurological: She is alert and oriented to person, place, and time.  Skin: Skin is warm and dry.  Psychiatric: She has a normal mood and affect.       Assessment & Plan:  Acute sinusitis-has had persistent symptoms 2 weeks that are not resolving. Plan treat with Augmentin. Call if not significantly better in one week. In regards to her eye symptoms recommend warm compresses and gentle massage the tear ducts.  Persistent diarrhea-discussed that if we get her school culture back and is negative and the plan will be to refer her to GI. Certainly she can use some Imodium as needed in the interim.

## 2017-09-10 NOTE — Telephone Encounter (Signed)
Pt advised at OV today.[name]

## 2017-09-14 LAB — CLOSTRIDIUM DIFFICILE BY PCR: Toxigenic C. Difficile by PCR: NOT DETECTED

## 2017-09-16 ENCOUNTER — Telehealth: Payer: Self-pay

## 2017-09-16 NOTE — Telephone Encounter (Signed)
Pt is requesting something to help her sleep.  She is able to go to sleep, but wakes up and then is awake for about 5 hours.  Please advise.

## 2017-09-16 NOTE — Telephone Encounter (Signed)
She needs an appointment. I do not have any documentation about insomnia problems. Certainly she can try something like Benadryl or Unisom over-the-counter if she would like. Or even melatonin. But there has to be a consultation as sleep medicines are scheduled drugs and we typically try other nondrug therapies before we do prescription medicine for sleep.

## 2017-09-17 ENCOUNTER — Other Ambulatory Visit (HOSPITAL_COMMUNITY): Payer: Self-pay | Admitting: Psychiatry

## 2017-09-17 ENCOUNTER — Ambulatory Visit (INDEPENDENT_AMBULATORY_CARE_PROVIDER_SITE_OTHER): Payer: 59 | Admitting: Psychiatry

## 2017-09-17 ENCOUNTER — Encounter (HOSPITAL_COMMUNITY): Payer: Self-pay | Admitting: Psychiatry

## 2017-09-17 VITALS — BP 118/72 | HR 64 | Resp 16 | Ht 68.0 in | Wt 138.0 lb

## 2017-09-17 DIAGNOSIS — Z813 Family history of other psychoactive substance abuse and dependence: Secondary | ICD-10-CM

## 2017-09-17 DIAGNOSIS — Z634 Disappearance and death of family member: Secondary | ICD-10-CM | POA: Diagnosis not present

## 2017-09-17 DIAGNOSIS — F331 Major depressive disorder, recurrent, moderate: Secondary | ICD-10-CM | POA: Diagnosis not present

## 2017-09-17 DIAGNOSIS — Z811 Family history of alcohol abuse and dependence: Secondary | ICD-10-CM | POA: Diagnosis not present

## 2017-09-17 DIAGNOSIS — F0630 Mood disorder due to known physiological condition, unspecified: Secondary | ICD-10-CM | POA: Diagnosis not present

## 2017-09-17 DIAGNOSIS — F419 Anxiety disorder, unspecified: Secondary | ICD-10-CM

## 2017-09-17 DIAGNOSIS — Z599 Problem related to housing and economic circumstances, unspecified: Secondary | ICD-10-CM | POA: Diagnosis not present

## 2017-09-17 DIAGNOSIS — Z818 Family history of other mental and behavioral disorders: Secondary | ICD-10-CM | POA: Diagnosis not present

## 2017-09-17 DIAGNOSIS — F411 Generalized anxiety disorder: Secondary | ICD-10-CM | POA: Diagnosis not present

## 2017-09-17 DIAGNOSIS — Z810 Family history of intellectual disabilities: Secondary | ICD-10-CM | POA: Diagnosis not present

## 2017-09-17 LAB — STOOL CULTURE
MICRO NUMBER: 81055372
MICRO NUMBER:: 81055373
MICRO NUMBER:: 81055374
SHIGA RESULT:: NOT DETECTED
SPECIMEN QUALITY: ADEQUATE
SPECIMEN QUALITY:: ADEQUATE
SPECIMEN QUALITY:: ADEQUATE

## 2017-09-17 MED ORDER — FLUOXETINE HCL 20 MG PO CAPS: 20.0000 mg | ORAL_CAPSULE | Freq: Every day | ORAL | 1 refills | Status: DC

## 2017-09-17 MED ORDER — BUSPIRONE HCL 10 MG PO TABS: 10.0000 mg | ORAL_TABLET | Freq: Two times a day (BID) | ORAL | 1 refills | Status: DC

## 2017-09-17 MED ORDER — ARIPIPRAZOLE 5 MG PO TABS: 5.0000 mg | ORAL_TABLET | Freq: Every day | ORAL | 1 refills | Status: DC

## 2017-09-17 NOTE — Telephone Encounter (Signed)
Pt advised, she will try the OTC options first. If they fail, she will come in for an appointment.
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
## 2017-09-22 NOTE — Telephone Encounter (Signed)
Medication refill- received fax from [facility] requesting a refill for Lamictal. Per Dr. Carin Primrose, refill request is denied. A 90 day supply was sent to pharmacy on 07/30/17. Pt next apt is schedule on 12/10/17. Nothing further is need at this time.

## 2017-09-25 ENCOUNTER — Other Ambulatory Visit (HOSPITAL_COMMUNITY): Payer: Self-pay | Admitting: Psychiatry

## 2017-09-26 NOTE — Telephone Encounter (Signed)
Medication refill- received fax from [facility] requesting a refill for Prozac. Per Dr. Gilmore Laroche, refill request is denied. Prozac rx was sent to pharmacy on 09/17/17 w/ 1 refill. Pt next apt is schedule on 12/10/17. Nothing further is need at this time.

## 2017-10-01 ENCOUNTER — Telehealth (HOSPITAL_COMMUNITY): Payer: Self-pay | Admitting: Psychiatry

## 2017-10-01 NOTE — Telephone Encounter (Signed)
Pt calling in regards to abilify rx. Would like to change  to . It does not seem to be working.

## 2017-10-01 NOTE — Telephone Encounter (Signed)
Return telephone call to pt. Per Dr. Gilmore Laroche, informed pt to increase Abilify from  to . Pt mat take 2  tablets. Pt was informed to follow up with the office 7-10 days. Per Dr. Gilmore Laroche, if pt is tolerating increase dose, pt may contact office for refills.

## 2017-10-01 NOTE — Telephone Encounter (Signed)
Can increase to . Start taking 2 of  for now. Call back for early refill if tolerates

## 2017-10-11 ENCOUNTER — Telehealth (HOSPITAL_COMMUNITY): Payer: Self-pay | Admitting: *Deleted

## 2017-10-11 ENCOUNTER — Ambulatory Visit: Payer: Self-pay | Admitting: Sports Medicine

## 2017-10-11 NOTE — Telephone Encounter (Signed)
Medication management- [facility] informing office pt's Buspirone rx has been placed on backordered.   Called and informed pt of refill status. Pt would like Buspar rx sent to [facility] [address] Called and spoke w/ Angelique Blonderenise at pharmacy. [facility] Pharmacy will notify pt when rx is ready for pickup. Lvm informing pt of refill status, the name of pharmacy rx was sent too and follow up apt time and date.

## 2017-10-15 ENCOUNTER — Ambulatory Visit (INDEPENDENT_AMBULATORY_CARE_PROVIDER_SITE_OTHER): Payer: 59 | Admitting: Sports Medicine

## 2017-10-15 DIAGNOSIS — M1711 Unilateral primary osteoarthritis, right knee: Secondary | ICD-10-CM

## 2017-10-15 NOTE — Progress Notes (Signed)
  Subjective:    CC: Right knee pain  HPI: This is a pleasant 61 year old female, she is post right knee arthroscopy, she had some arthritis, and a meniscal tear.  She is having moderate swelling, pain at the medial joint line, stiffness.  Has tried oral NSAIDs, narcotics without much efficacy.  Pain is localized without radiation.  No mechanical symptoms.  Past medical history:  Negative.  See flowsheet/record as well for more information.  Surgical history: Negative.  See flowsheet/record as well for more information.  Family history: Negative.  See flowsheet/record as well for more information.  Social history: Negative.  See flowsheet/record as well for more information.  Allergies, and medications have been entered into the medical record, reviewed, and no changes needed.   Review of Systems: No fevers, chills, night sweats, weight loss, chest pain, or shortness of breath.   Objective:    General: Well Developed, well nourished, and in no acute distress.  Neuro: Alert and oriented x3, extra-ocular muscles intact, sensation grossly intact.  HEENT: Normocephalic, atraumatic, pupils equal round reactive to light, neck supple, no masses, no lymphadenopathy, thyroid nonpalpable.  Skin: Warm and dry, no rashes. Cardiac: Regular rate and rhythm, no murmurs rubs or gallops, no lower extremity edema.  Respiratory: Clear to auscultation bilaterally. Not using accessory muscles, speaking in full sentences. Right knee: Valgus deformity, moderate effusion, tender at the medial and lateral joint lines ROM normal in flexion and extension and lower leg rotation. Ligaments with solid consistent endpoints including ACL, PCL, LCL, MCL. Negative Mcmurray's and provocative meniscal tests. Non painful patellar compression. Patellar and quadriceps tendons unremarkable. Hamstring and quadriceps strength is normal.  Procedure: Real-time Ultrasound Guided aspiration/injection of right knee Device: GE  Logiq E  Verbal informed consent obtained.  Time-out conducted.  Noted no overlying erythema, induration, or other signs of local infection.  Skin prepped in a sterile fashion.  Local anesthesia: Topical Ethyl chloride.  With sterile technique and under real time ultrasound guidance: Aspirated approximately 40 cc straw-colored fluid, syringe switched in 1 cc kenalog 40, 2 cc lidocaine, 2 cc bupivacaine injected easily Completed without difficulty  Pain immediately resolved suggesting accurate placement of the medication.  Advised to call if fevers/chills, erythema, induration, drainage, or persistent bleeding.  Images permanently stored and available for review in the ultrasound unit.  Impression: Technically successful ultrasound guided injection.  The knee was then strapped with a compressive dressing.  Impression and Recommendations:    Primary osteoarthritis of right knee Post partial meniscectomy by Dr. Luiz BlareGraves, aspiration and injection today, return as needed. Strapped with compressive dressing. ___________________________________________ Ihor Austinhomas J. Benjamin Stainhekkekandam, M.D., ABFM., CAQSM. Primary Care and Sports Medicine [facility]  Adjunct Instructor of Family Medicine  [employer]

## 2017-10-15 NOTE — Assessment & Plan Note (Addendum)
Post partial meniscectomy by Dr. Luiz BlareGraves, aspiration and injection today, return as needed. Strapped with compressive dressing.

## 2017-10-18 ENCOUNTER — Telehealth: Payer: Self-pay | Admitting: Family Medicine

## 2017-10-18 DIAGNOSIS — R195 Other fecal abnormalities: Secondary | ICD-10-CM

## 2017-10-18 NOTE — Telephone Encounter (Signed)
Pt called and requested a referral be put in for her to go to GI for bowel issues. Please update Patient and let her know this has been done.  Thanks

## 2017-10-18 NOTE — Telephone Encounter (Signed)
Please advise if this is OK or if Pt needs to come in.

## 2017-10-19 NOTE — Telephone Encounter (Signed)
Ok to go ahead with ref to [facility]

## 2017-10-19 NOTE — Telephone Encounter (Signed)
Referral placed.

## 2017-10-21 ENCOUNTER — Ambulatory Visit (INDEPENDENT_AMBULATORY_CARE_PROVIDER_SITE_OTHER): Payer: 59 | Admitting: Family Medicine

## 2017-10-21 ENCOUNTER — Telehealth (HOSPITAL_COMMUNITY): Payer: Self-pay | Admitting: Psychiatry

## 2017-10-21 ENCOUNTER — Encounter: Payer: Self-pay | Admitting: Family Medicine

## 2017-10-21 VITALS — BP 119/69 | HR 83 | Ht 68.0 in | Wt 145.0 lb

## 2017-10-21 DIAGNOSIS — F439 Reaction to severe stress, unspecified: Secondary | ICD-10-CM

## 2017-10-21 DIAGNOSIS — F5101 Primary insomnia: Secondary | ICD-10-CM

## 2017-10-21 MED ORDER — TRAZODONE HCL 50 MG PO TABS: 25.0000 mg | ORAL_TABLET | Freq: Every evening | ORAL | 1 refills | Status: DC | PRN

## 2017-10-21 NOTE — Progress Notes (Signed)
   Subjective:    Patient ID: [name], female    DOB: May 13, 1956, 61 y.o.   MRN: [id number]  HPI 61 year old female comes in today to discuss insomnia.  She was having some problems when I saw her previously really did not go into much detail.  But she reports that for the last 3 months that she has had difficulty particularly staying asleep.  If this is right around the time that she was started on Abilify.  She was started on that right at the beginning of August.  She says she normally has a strict bedtime around 9 PM but then unfortunately will wake back up anywhere between 11:30 PM and 2:30 AM and then just cannot go back to sleep.  For about the last 6 weeks she has been trying over-the-counter Unisom.  It does help her fall asleep but does not help at all with sleep maintenance.  She says normally she can sleep anywhere from 10-12 hours.  She said up until recently she is never had difficulty with sleep.  Recently her Abilify dose was increased to 10 mg on October 20 has she recently just started that.  She does admit that she has been under a little bit more stress recently.  Her husband is planning on retiring at the end of the year and is very strict about finances and has been much more diligent and this has caused some discord in the home about finances.   Review of Systems     Objective:   Physical Exam  Constitutional: She is oriented to person, place, and time. She appears well-developed and well-nourished.  HENT:  Head: Normocephalic and atraumatic.  Eyes: Conjunctivae and EOM are normal.  Cardiovascular: Normal rate.   Pulmonary/Chest: Effort normal.  Neurological: She is alert and oriented to person, place, and time.  Skin: Skin is dry. No pallor.  Psychiatric: She has a normal mood and affect. Her behavior is normal.  Vitals reviewed.         Assessment & Plan:  Insomnia-with most of the issue around sleep maintenance.  We discussed strategies around this.  She  does not really consume a lot of caffeine.  We discussed having a cool, calm and dark and quiet bedroom.  It does seem like she has a consistent bedtime which is very helpful.  Certainly she has had some increased stressors recently which be could be contributing but she also started Abilify right around the time that the sleep issues are started to occur.  I did encourage her to speak with her psychiatrist about this.  We also discussed trying trazodone for sleep.  Recommend that she start with a half a tab and try that nightly for a week and if she needs to then go up to a whole tab.  Work on stress reduction.   Stress at home-we discussed some strategies around discussing finances and setting up a plan that they can both agree on.  It might even be helpful for them to meet with a therapist/counselor or even a Firefighterfinancial advisor.  She says they do have one.  I think that this could help be helpful in the long run especially if her husband is already getting very worried and worked up about finances and he does not retire until the end of this year.  Certainly stress could be adding to her insomnia difficulties.  Time spent 25 minutes, greater than 50% time spent counseling about sleep and sleep hygiene.

## 2017-10-21 NOTE — Telephone Encounter (Signed)
Pt would like to know if the abilify rx she is taking is making her stay awake. She has not been sleeping though the night.   cb # [phone]

## 2017-10-21 NOTE — Telephone Encounter (Signed)
Change it to day or earlier time if taking later part of the day

## 2017-10-22 NOTE — Telephone Encounter (Signed)
Spoke to patient. Informed her of the following. Nothing further needed at this time.

## 2017-10-25 ENCOUNTER — Telehealth (HOSPITAL_COMMUNITY): Payer: Self-pay | Admitting: *Deleted

## 2017-10-25 NOTE — Telephone Encounter (Signed)
Medication management- received fax from [facility] requesting a 90 day supply of Abilify. Pt's insurance requires a 90 day to cover the cost of medication. Please advise.

## 2017-10-26 MED ORDER — ARIPIPRAZOLE 10 MG PO TABS: 10.0000 mg | ORAL_TABLET | Freq: Every day | ORAL | 0 refills | Status: DC

## 2017-10-26 NOTE — Telephone Encounter (Signed)
Can provide unless there is history of overdose or suicide attempt in past.

## 2017-10-26 NOTE — Telephone Encounter (Signed)
Medication refill- received fax from [facility] requesting a 90 day supply for Abilify. Per Dr. Gilmore LarocheAkhtar, refill request is authorize for Abilify 5mg , #90. Rx was sent pharmacy. Pt next apt is schedule on 12/10/17. Lvm informing pt of refill status. Nothing further is need at this time.

## 2017-10-29 ENCOUNTER — Ambulatory Visit: Payer: 59 | Admitting: Sports Medicine

## 2017-11-04 ENCOUNTER — Telehealth: Payer: Self-pay | Admitting: Family Medicine

## 2017-11-04 NOTE — Telephone Encounter (Signed)
I called pt. There is burning when she urinates and she's going to the bathroom frequently.  Thank you.

## 2017-11-04 NOTE — Telephone Encounter (Signed)
Needs nurse visit for UA sample

## 2017-11-04 NOTE — Telephone Encounter (Signed)
shee needs nurse visit

## 2017-11-04 NOTE — Telephone Encounter (Signed)
She was last seen in July by Dr. Denyse Amassorey for this.  May need to come in for at least a nurse visit to give a sample.  Call to get a little bit more information.  I do not see a standing order on her medication list.

## 2017-11-04 NOTE — Telephone Encounter (Signed)
Pt called. She has an ongoing problem with bladder infection and wants rx called into  her pharmacy(she said doc is aware of  this ongoing problem).  Thank you.

## 2017-11-05 ENCOUNTER — Ambulatory Visit (INDEPENDENT_AMBULATORY_CARE_PROVIDER_SITE_OTHER): Payer: 59 | Admitting: Family Medicine

## 2017-11-05 ENCOUNTER — Encounter: Payer: Self-pay | Admitting: Family Medicine

## 2017-11-05 VITALS — BP 139/71 | HR 60 | Temp 99.1°F | Ht 68.0 in | Wt 148.0 lb

## 2017-11-05 DIAGNOSIS — R300 Dysuria: Secondary | ICD-10-CM

## 2017-11-05 DIAGNOSIS — N3000 Acute cystitis without hematuria: Secondary | ICD-10-CM | POA: Diagnosis not present

## 2017-11-05 DIAGNOSIS — N390 Urinary tract infection, site not specified: Secondary | ICD-10-CM | POA: Diagnosis not present

## 2017-11-05 LAB — POCT URINALYSIS DIPSTICK
BILIRUBIN UA: NEGATIVE
GLUCOSE UA: NEGATIVE
KETONES UA: NEGATIVE
NITRITE UA: POSITIVE
PROTEIN UA: 30
Spec Grav, UA: 1.02 (ref 1.010–1.025)
Urobilinogen, UA: 0.2 E.U./dL
pH, UA: 6 (ref 5.0–8.0)

## 2017-11-05 MED ORDER — NITROFURANTOIN MONOHYD MACRO 100 MG PO CAPS: 100.0000 mg | ORAL_CAPSULE | Freq: Two times a day (BID) | ORAL | 0 refills | Status: DC

## 2017-11-05 NOTE — Telephone Encounter (Signed)
Okay we will be expecting her call.

## 2017-11-05 NOTE — Telephone Encounter (Signed)
Left VM for Pt to return clinic call to schedule appt.

## 2017-11-05 NOTE — Progress Notes (Signed)
   Subjective:    Patient ID: Sharon AmbleNancy Greene, female    DOB: 10-Mar-1956, 61 y.o.   MRN: [id number]  HPI 61 year old female comes in today complaining of 4 days of dysuria, frequency, odor.   she has not tried anything over-the-counter.  She denies any fever, chills or sweats.  No significant abdominal pain or low back pain.   Review of Systems     Objective:   Physical Exam  Constitutional: She appears well-developed and well-nourished.  Cardiovascular: Normal rate and normal heart sounds.  Abdominal:  No suprapubic tenderness.  Musculoskeletal:  No CVA tenderness.           Assessment & Plan:  Urinary tract infection-we will treat with nitrofurantoin.  Last UTI was in July and she was treated with Cipro at that time.  These do tend to be recurrent for her.

## 2017-11-08 ENCOUNTER — Encounter: Payer: Self-pay | Admitting: Sports Medicine

## 2017-11-08 ENCOUNTER — Ambulatory Visit (INDEPENDENT_AMBULATORY_CARE_PROVIDER_SITE_OTHER): Payer: 59 | Admitting: Sports Medicine

## 2017-11-08 DIAGNOSIS — M1711 Unilateral primary osteoarthritis, right knee: Secondary | ICD-10-CM

## 2017-11-08 MED ORDER — DICLOFENAC SODIUM 75 MG PO TBEC: 75.0000 mg | DELAYED_RELEASE_TABLET | Freq: Two times a day (BID) | ORAL | 3 refills | Status: DC

## 2017-11-08 NOTE — Assessment & Plan Note (Signed)
Patellofemoral osteoarthritis, aspiration and injection last month did not provide sufficient relief. She is post partial meniscectomy by Dr. Luiz BlareGraves. Adding additional aggressive physical therapy, as well as getting her approved for viscosupplementation. Return to start shots when approved. Switching from ibuprofen to Voltaren. If all of the above fails she will need a total knee arthroplasty.

## 2017-11-08 NOTE — Progress Notes (Signed)
  Subjective:    CC: Right knee pain  HPI: [name] returns, we did an aspiration and injection last month, unfortunately she did not have sufficient relief, she does not really have swelling but mostly pain under the kneecap, worse going up and down stairs.  She is post partial meniscectomy with Dr. Luiz BlareGraves.  Pain is moderate, persistent, localized laterally under the patellofemoral joint without radiation or mechanical symptoms.  Past medical history:  Negative.  See flowsheet/record as well for more information.  Surgical history: Negative.  See flowsheet/record as well for more information.  Family history: Negative.  See flowsheet/record as well for more information.  Social history: Negative.  See flowsheet/record as well for more information.  Allergies, and medications have been entered into the medical record, reviewed, and no changes needed.   Review of Systems: No fevers, chills, night sweats, weight loss, chest pain, or shortness of breath.   Objective:    General: Well Developed, well nourished, and in no acute distress.  Neuro: Alert and oriented x3, extra-ocular muscles intact, sensation grossly intact.  HEENT: Normocephalic, atraumatic, pupils equal round reactive to light, neck supple, no masses, no lymphadenopathy, thyroid nonpalpable.  Skin: Warm and dry, no rashes. Cardiac: Regular rate and rhythm, no murmurs rubs or gallops, no lower extremity edema.  Respiratory: Clear to auscultation bilaterally. Not using accessory muscles, speaking in full sentences. Right knee: Normal to inspection with no erythema or effusion or obvious bony abnormalities. Tender to palpation under the lateral patellar facet ROM normal in flexion and extension and lower leg rotation. Ligaments with solid consistent endpoints including ACL, PCL, LCL, MCL. Negative Mcmurray's and provocative meniscal tests. Non painful patellar compression. Patellar and quadriceps tendons unremarkable. Hamstring  and quadriceps strength is normal.  Impression and Recommendations:    Primary osteoarthritis of right knee Patellofemoral osteoarthritis, aspiration and injection last month did not provide sufficient relief. She is post partial meniscectomy by Dr. Luiz BlareGraves. Adding additional aggressive physical therapy, as well as getting her approved for viscosupplementation. Return to start shots when approved. Switching from ibuprofen to Voltaren. If all of the above fails she will need a total knee arthroplasty.  I spent 25 minutes with this patient, greater than 50% was face-to-face time counseling regarding the above diagnoses ___________________________________________ Ihor Austinhomas J. Benjamin Stainhekkekandam, M.D., ABFM., CAQSM. Primary Care and Sports Medicine [facility]  Adjunct Instructor of Family Medicine  [employer]

## 2017-11-12 ENCOUNTER — Other Ambulatory Visit (HOSPITAL_COMMUNITY): Payer: Self-pay | Admitting: Psychiatry

## 2017-11-16 ENCOUNTER — Telehealth: Payer: Self-pay | Admitting: Sports Medicine

## 2017-11-16 NOTE — Telephone Encounter (Signed)
-----   Message from Monica Bectonhomas J Thekkekandam, MD sent at 11/08/2017  3:11 PM EST ----- Visco approval please Edman Lipsey, right knee only, XR confirmed, failed steroid injections and therapy. Thanks Boogs. ___________________________________________ Ihor Austinhomas J. Benjamin Stainhekkekandam, M.D., ABFM., CAQSM. Primary Care and Sports Medicine [facility]  Adjunct Instructor of Family Medicine  [employer]

## 2017-11-16 NOTE — Telephone Encounter (Signed)
Submitted for approval on Orthovisc. Awaiting confirmation.  

## 2017-11-17 NOTE — Telephone Encounter (Signed)
completed PA form and placed it in Provider box for review.

## 2017-11-19 ENCOUNTER — Other Ambulatory Visit: Payer: Self-pay | Admitting: Sports Medicine

## 2017-11-19 MED ORDER — CROSS-LINK HYAL ACID (VISC) 30 MG/3ML IX PRSY: PREFILLED_SYRINGE | INTRA_ARTICULAR | 0 refills | Status: DC

## 2017-11-19 NOTE — Telephone Encounter (Signed)
Received a second PA form. Completed and placed it Physician box for review.

## 2017-11-22 ENCOUNTER — Ambulatory Visit (INDEPENDENT_AMBULATORY_CARE_PROVIDER_SITE_OTHER): Payer: 59 | Admitting: Psychiatry

## 2017-11-22 ENCOUNTER — Encounter (HOSPITAL_COMMUNITY): Payer: Self-pay | Admitting: Psychiatry

## 2017-11-22 DIAGNOSIS — Z810 Family history of intellectual disabilities: Secondary | ICD-10-CM

## 2017-11-22 DIAGNOSIS — F0630 Mood disorder due to known physiological condition, unspecified: Secondary | ICD-10-CM

## 2017-11-22 DIAGNOSIS — F419 Anxiety disorder, unspecified: Secondary | ICD-10-CM | POA: Diagnosis not present

## 2017-11-22 DIAGNOSIS — R251 Tremor, unspecified: Secondary | ICD-10-CM

## 2017-11-22 DIAGNOSIS — F411 Generalized anxiety disorder: Secondary | ICD-10-CM | POA: Diagnosis not present

## 2017-11-22 DIAGNOSIS — Z634 Disappearance and death of family member: Secondary | ICD-10-CM

## 2017-11-22 DIAGNOSIS — Z818 Family history of other mental and behavioral disorders: Secondary | ICD-10-CM

## 2017-11-22 DIAGNOSIS — Z813 Family history of other psychoactive substance abuse and dependence: Secondary | ICD-10-CM

## 2017-11-22 DIAGNOSIS — Z811 Family history of alcohol abuse and dependence: Secondary | ICD-10-CM | POA: Diagnosis not present

## 2017-11-22 MED ORDER — LAMOTRIGINE 25 MG PO TABS: 50.0000 mg | ORAL_TABLET | Freq: Every day | ORAL | 0 refills | Status: DC

## 2017-11-22 MED ORDER — FLUOXETINE HCL 10 MG PO CAPS: 30.0000 mg | ORAL_CAPSULE | Freq: Every day | ORAL | 1 refills | Status: DC

## 2017-11-22 MED ORDER — BUSPIRONE HCL 10 MG PO TABS: 10.0000 mg | ORAL_TABLET | Freq: Two times a day (BID) | ORAL | 0 refills | Status: DC

## 2017-11-22 MED ORDER — ARIPIPRAZOLE 10 MG PO TABS: 5.0000 mg | ORAL_TABLET | Freq: Every day | ORAL | 0 refills | Status: DC

## 2017-11-22 NOTE — Telephone Encounter (Signed)
Patient came in inquiring about the status of her OrthoVisc injection. Please advise. Thanks!

## 2017-11-22 NOTE — Progress Notes (Signed)
Patient ID: [name], female   DOB: November 01, 1956, 61 y.o.   MRN: [id number]   [facility]  [name] November 01, 1956  Date: 09/16/2017  Chief Complaint:  Follow Up. Depression and anxiety  History of Chief Complaint:   HPI Comments: [name] is a 61 y/o female with a past psychiatric history significant for Major Depression, Recurrent severe, generalized anxiety and bereavement. The patient returns  for psychiatric services and medication management.   Her depression dates back to 2008 when she lost her mom and also her brother was using drugs.  Patient has been feeling down also tremors mild head shaking while at rest she has made an appointment with primary care physician. She has been on Abilify it has been helping but now she feels dysphoric. Anxious about her medical condition as well.  Recently got off of oxycontin and now on suboxone but seldom taking as it makes her sleepy and tired  Anxiety worse. Husband retiring. Job stress  more anxiety during the day  Infrequent use of klonopine for anxieyt Grief: not worse  Relationship: distant   . Modifying factors: exercise but not doing too much   Review of Systems  Constitutional: Negative for fever.  Cardiovascular: Negative for chest pain.  Gastrointestinal: Negative for nausea.  Skin: Negative for rash.  Neurological: Positive for tremors.  Psychiatric/Behavioral: Positive for depression.     There were no vitals filed for this visit. Physical Exam  Vitals reviewed.  Constitutional: She appears well-developed and well-nourished. No distress.  Skin: She is not diaphoretic.     Past Medical History: Reviewed Past Medical History:  Diagnosis Date  . Anxiety   . Arthritis    Right Hip  . Breast disorder     Rt Breast Calcifications  . Chronic cystitis   . Depression   . Dyspareunia   . Hypercholesterolemia   . Hypothyroidism   . Plantar fasciitis of left foot     2013  . Torn meniscus 11/13/15   Rt knee   Allergies: Reviewed Allergies  Allergen Reactions  . Bupropion     Other reaction(s): Other Suicidal thoughts  . Sulfa Antibiotics Itching and Hives   Current Medications: Reviewed Current Outpatient Medications on File Prior to Visit  Medication Sig Dispense Refill  . Buprenorphine HCl-Naloxone HCl (SUBOXONE) 8-2 MG FILM Place under the tongue.    . clonazePAM (KLONOPIN) 0.5 MG tablet TAKE 1 TABLET BY MOUTH EVERY DAY AS NEEDED 90 tablet 0  . Cross-Linked Hyaluronate (GEL-ONE) 30 MG/3ML PRSY Inject into the right knee x1, needs to be mailed to either the patient or my office. 1 Syringe 0  . diclofenac (VOLTAREN) 75 MG EC tablet Take 1 tablet (75 mg total) 2 (two) times daily by mouth. 60 tablet 3  . ibuprofen (ADVIL,MOTRIN) 800 MG tablet Take 1 tablet (800 mg total) by mouth every 8 (eight) hours as needed. 60 tablet 2  . levothyroxine (SYNTHROID, LEVOTHROID) 88 MCG tablet TAKE 1 TABLET (88 MCG TOTAL) BY MOUTH DAILY BEFORE BREAKFAST. 90 tablet 2  . nitrofurantoin, macrocrystal-monohydrate, (MACROBID) 100 MG capsule Take 1 capsule (100 mg total) 2 (two) times daily by mouth. 10 capsule 0  . OVER THE COUNTER MEDICATION     . traZODone (DESYREL) 50 MG tablet Take 0.5-1 tablets (25-50 mg total) by mouth at bedtime as needed for sleep. 60 tablet 1  . valACYclovir (VALTREX) 500 MG tablet TAKE 1 TABLET (500 MG TOTAL) BY MOUTH DAILY AS NEEDED. 90 tablet  1   No current facility-administered medications on file prior to visit.      Substance Abuse History in the last 12 months: Reviewed Social History   Tobacco Use  . Smoking status: Former Smoker    Last attempt to quit: 09/20/1982    Years since quitting: 35.1  . Smokeless tobacco: Never Used  Substance Use Topics  . Alcohol use: Yes    Alcohol/week: 0.0 oz    Comment: Use is 1-2 a month.  Caffeine: 2 cups a day   Family History: Reviewed Family History   Problem  Relation  Age of  Onset   .  COPD     .  Aneurysm     .  Anxiety disorder  Mother    .  Depression  Mother    .  Alcohol abuse  Father    .  Heart murmur  Father    .  Drug abuse  Brother    .  Anxiety disorder  Brother    .  Depression  Brother    .  Dementia  Maternal Grandfather     Psychiatric Specialty Exam:  Objective: Appearance: Casual and Well Groomed   Eye Contact:: Good   Speech: Clear and Coherent and Normal Rate   Volume: Normal   Mood:  subdued   Affect:  constricted  Thought Process: Coherent, Logical and Loose   Orientation: Full   Thought Content: WDL   Suicidal Thoughts: No   Homicidal Thoughts: No   Judgement: Fair   Insight: Fair   Psychomotor Activity: Normal   Akathisia: No   Handed: Left   Memory: Immediate 3/3, recent: 1/3   . Assets: Communication Skills  Desire for Improvement  Financial Resources/Insurance  Housing  Leisure Time  Physical Health  Resilience  Transportation  Vocational/Educational    Laboratory/X-Ray  Psychological Evaluation(s)   NONE  NONE    Assessment:  AXIS I  Major Depression, Recurrent -. Generalized anxiety disorder. bereavement  AXIS II  No diagnosis   AXIS III  Past Medical History    Diagnosis  Date    .  Hypercholesterolemia     .  Arthritis       Right Hip    .  Hypothyroidism     .  Chronic cystitis     .  Decreased libido     .  Dyspareunia     .  Breast disorder       Rt Breast Calcifications    .  Depression     .  Anxiety     .  Plantar fasciitis of left foot       2013      AXIS IV  other psychosocial or environmental problems   AXIS V  64   Treatment Plan/Recommendations:   Plan of Care:  PLAN:  1. Affirm with the patient that the medications are taken as ordered. Patient  expressed understanding of how their medications were to be used.    Laboratory:    Psychotherapy: Therapy: brief supportive therapy provided.  Discussed psychosocial stressors in detail. More than 50% of the visit was spent on  individual therapy/counseling.   Medications:  Continue  the following psychiatric medications as written prior to this appointment with the following changes:: continue following  1. Depression: worse. Cut down abilify altought no had EPS before but would taper down to be cautious and stop in one week.  Increase prozac to 30mg  for depression 2. Anxiety disorder;worse.  Increase prozac , continue buspar, prozac  3. Grief. Not worse but feeling low. Will adjust prozac  Return in 3 weeks. Follow up with primary care to evaluate for tremors. Some changes recently is been off pain medications In case having headaches or worsening of symptoms to call earlier for visit             Thresa RossNadeem Shaelin Lalley, MD

## 2017-11-26 ENCOUNTER — Ambulatory Visit (INDEPENDENT_AMBULATORY_CARE_PROVIDER_SITE_OTHER): Payer: 59 | Admitting: Family Medicine

## 2017-11-26 ENCOUNTER — Other Ambulatory Visit (HOSPITAL_COMMUNITY): Payer: Self-pay | Admitting: Psychiatry

## 2017-11-26 ENCOUNTER — Encounter: Payer: Self-pay | Admitting: Family Medicine

## 2017-11-26 VITALS — BP 120/77 | HR 96 | Ht 68.0 in | Wt 145.0 lb

## 2017-11-26 DIAGNOSIS — R251 Tremor, unspecified: Secondary | ICD-10-CM

## 2017-11-26 DIAGNOSIS — R6889 Other general symptoms and signs: Secondary | ICD-10-CM | POA: Diagnosis not present

## 2017-11-26 NOTE — Progress Notes (Signed)
Subjective:    Patient ID: [name], female    DOB: 11-29-1956, 61 y.o.   MRN: [id number]  HPI 61 year old female comes in today complaining of tremor of the head and right leg. She has noticed it particularly ovr the last 6 months. Her family has noticed it much longer... > 1 year.  She has several relatives that she remembers having a tremor as well. No parkinson's disease in the family.  She has no gait difficulty.   She reports her hand writing is getting worse.  Its gotten bigger.  No change in her gait.    She feels she is more cold intolerant as well and would like to be checked for anemia.   In regards to her chronic pain regimen. She is no longer on Suboxone.     Review of Systems  BP 120/77   Pulse 96   Ht 5\' 8"  (1.727 m)   Wt 145 lb (65.8 kg)   SpO2 99%   BMI 22.05 kg/m     Allergies  Allergen Reactions  . Bupropion     Other reaction(s): Other Suicidal thoughts  . Sulfa Antibiotics Itching and Hives    Past Medical History:  Diagnosis Date  . Anxiety   . Arthritis    Right Hip  . Breast disorder     Rt Breast Calcifications  . Chronic cystitis   . Depression   . Dyspareunia   . Hypercholesterolemia   . Hypothyroidism   . Plantar fasciitis of left foot    2013  . Torn meniscus 11/13/15   Rt knee    Past Surgical History:  Procedure Laterality Date  . BREAST ENHANCEMENT SURGERY    . KNEE SURGERY Right 11/16  . plastic surgry to eyes    . Scoliosis repair with Harrington Rods      Social History   Socioeconomic History  . Marital status: Married    Spouse name: Not on file  . Number of children: Not on file  . Years of education: Not on file  . Highest education level: Not on file  Social Needs  . Financial resource strain: Not on file  . Food insecurity - worry: Not on file  . Food insecurity - inability: Not on file  . Transportation needs - medical: Not on file  . Transportation needs - non-medical: Not on file  Occupational  History  . Occupation: CNA  Tobacco Use  . Smoking status: Former Smoker    Last attempt to quit: 09/20/1982    Years since quitting: 35.2  . Smokeless tobacco: Never Used  Substance and Sexual Activity  . Alcohol use: Yes    Alcohol/week: 0.0 oz    Comment: Use is 1-2 a month.  . Drug use: No  . Sexual activity: No    Partners: Male    Birth control/protection: None  Other Topics Concern  . Not on file  Social History Narrative   She works in indepednet living with seniors.      Family History  Problem Relation Age of Onset  . Anxiety disorder Mother   . Depression Mother   . COPD Father        smoker  . Alcohol abuse Father   . Heart murmur Father   . Aneurysm Unknown   . Drug abuse Brother   . Anxiety disorder Brother   . Depression Brother   . Dementia Maternal Grandfather     Outpatient Encounter Medications as of 11/26/2017  Medication  Sig  . Buprenorphine HCl-Naloxone HCl (SUBOXONE) 8-2 MG FILM Place under the tongue.  . busPIRone (BUSPAR) 10 MG tablet Take 1 tablet (10 mg total) by mouth 2 (two) times daily.  . clonazePAM (KLONOPIN) 0.5 MG tablet TAKE 1 TABLET BY MOUTH EVERY DAY AS NEEDED  . Cross-Linked Hyaluronate (GEL-ONE) 30 MG/3ML PRSY Inject into the right knee x1, needs to be mailed to either the patient or my office.  . diclofenac (VOLTAREN) 75 MG EC tablet Take 1 tablet (75 mg total) 2 (two) times daily by mouth.  Marland Kitchen. FLUoxetine (PROZAC) 10 MG capsule Take 3 capsules (30 mg total) by mouth daily.  Marland Kitchen. ibuprofen (ADVIL,MOTRIN) 800 MG tablet Take 1 tablet (800 mg total) by mouth every 8 (eight) hours as needed.  . lamoTRIgine (LAMICTAL) 25 MG tablet Take 2 tablets (50 mg total) by mouth daily.  Marland Kitchen. levothyroxine (SYNTHROID, LEVOTHROID) 88 MCG tablet TAKE 1 TABLET (88 MCG TOTAL) BY MOUTH DAILY BEFORE BREAKFAST.  Marland Kitchen. traZODone (DESYREL) 50 MG tablet Take 0.5-1 tablets (25-50 mg total) by mouth at bedtime as needed for sleep.  . valACYclovir (VALTREX) 500 MG tablet  TAKE 1 TABLET (500 MG TOTAL) BY MOUTH DAILY AS NEEDED.  . [DISCONTINUED] nitrofurantoin, macrocrystal-monohydrate, (MACROBID) 100 MG capsule Take 1 capsule (100 mg total) 2 (two) times daily by mouth.  . [DISCONTINUED] OVER THE COUNTER MEDICATION    No facility-administered encounter medications on file as of 11/26/2017.          Objective:   Physical Exam  Constitutional: She is oriented to person, place, and time. She appears well-developed and well-nourished.  HENT:  Head: Normocephalic and atraumatic.  Right Ear: External ear normal.  Left Ear: External ear normal.  Nose: Nose normal.  Mouth/Throat: Oropharynx is clear and moist.  TMs and canals are clear.   Eyes: Conjunctivae and EOM are normal. Pupils are equal, round, and reactive to light.  Neck: Neck supple. No thyromegaly present.  Cardiovascular: Normal rate, regular rhythm and normal heart sounds.  Pulmonary/Chest: Effort normal and breath sounds normal. She has no wheezes.  Lymphadenopathy:    She has no cervical adenopathy.  Neurological: She is alert and oriented to person, place, and time. She has normal reflexes. She displays normal reflexes. No cranial nerve deficit. She exhibits normal muscle tone. Coordination normal.  Normal rapid finger movements.  Heel to shin normal.  Gait is normal.  She does have a significant tremor of the head and the right leg.  They occur more at rest.  A little bit of tremor in her right hand.  tremor in hand with intention.    Skin: Skin is warm and dry.  Psychiatric: She has a normal mood and affect.        Assessment & Plan:    Tremor -  Consider  essential tremor based on history and exam.  Likely benign.  Will check thyroid, labs and heavy metal screen.  Will test for anemia.  It is unusual to only affect her head and right leg.  Consider brain MRI.  Also consider dystonic head tremor since doesn't seem to affect the hands very much at all. The leg tremor in unusual for ET as  well.  Cosnider referral to neurology for further evaluation.    Cold intolerance - will check for anemia.

## 2017-11-28 ENCOUNTER — Encounter: Payer: Self-pay | Admitting: Family Medicine

## 2017-11-29 LAB — COMPLETE METABOLIC PANEL WITH GFR
AG Ratio: 1.7 (calc) (ref 1.0–2.5)
ALBUMIN MSPROF: 4.2 g/dL (ref 3.6–5.1)
ALT: 9 U/L (ref 6–29)
AST: 14 U/L (ref 10–35)
Alkaline phosphatase (APISO): 49 U/L (ref 33–130)
BUN / CREAT RATIO: 13 (calc) (ref 6–22)
BUN: 14 mg/dL (ref 7–25)
CALCIUM: 10 mg/dL (ref 8.6–10.4)
CO2: 27 mmol/L (ref 20–32)
CREATININE: 1.04 mg/dL — AB (ref 0.50–0.99)
Chloride: 105 mmol/L (ref 98–110)
GFR, EST AFRICAN AMERICAN: 67 mL/min/{1.73_m2} (ref >=60)
GFR, EST NON AFRICAN AMERICAN: 58 mL/min/{1.73_m2} — AB (ref >=60)
GLUCOSE: 106 mg/dL (ref 65–139)
Globulin: 2.5 g/dL (calc) (ref 1.9–3.7)
Potassium: 3.6 mmol/L (ref 3.5–5.3)
Sodium: 141 mmol/L (ref 135–146)
TOTAL PROTEIN: 6.7 g/dL (ref 6.1–8.1)
Total Bilirubin: 0.4 mg/dL (ref 0.2–1.2)

## 2017-11-29 LAB — CBC
HCT: 38 % (ref 35.0–45.0)
Hemoglobin: 12.9 g/dL (ref 11.7–15.5)
MCH: 29.8 pg (ref 27.0–33.0)
MCHC: 33.9 g/dL (ref 32.0–36.0)
MCV: 87.8 fL (ref 80.0–100.0)
MPV: 9.9 fL (ref 7.5–12.5)
PLATELETS: 266 10*3/uL (ref 140–400)
RBC: 4.33 10*6/uL (ref 3.80–5.10)
RDW: 12.3 % (ref 11.0–15.0)
WBC: 6.9 10*3/uL (ref 3.8–10.8)

## 2017-11-29 LAB — VITAMIN B12: VITAMIN B 12: 926 pg/mL (ref 200–1100)

## 2017-11-29 LAB — HEAVY METALS PANEL, BLOOD
Arsenic: <10 mcg/L (ref <23)
LEAD: 3 ug/dL (ref <5)
Mercury, B: <5 mcg/L (ref 0–10)

## 2017-11-29 LAB — TSH: TSH: 0.62 mIU/L (ref 0.40–4.50)

## 2017-11-29 LAB — FERRITIN: FERRITIN: 43 ng/mL (ref 20–288)

## 2017-11-30 ENCOUNTER — Telehealth: Payer: Self-pay | Admitting: *Deleted

## 2017-11-30 NOTE — Telephone Encounter (Signed)
Patient had questions about orthovisc approval. After looking in the chart advised patient that we are awaiting approval.

## 2017-12-06 LAB — HM COLONOSCOPY

## 2017-12-10 ENCOUNTER — Ambulatory Visit (HOSPITAL_COMMUNITY): Payer: 59 | Admitting: Psychiatry

## 2017-12-10 NOTE — Telephone Encounter (Signed)
Completed third form, faxed back. Pt advised.

## 2017-12-13 ENCOUNTER — Other Ambulatory Visit: Payer: Self-pay | Admitting: Family Medicine

## 2017-12-24 ENCOUNTER — Ambulatory Visit (INDEPENDENT_AMBULATORY_CARE_PROVIDER_SITE_OTHER): Payer: 59 | Admitting: Psychiatry

## 2017-12-24 ENCOUNTER — Other Ambulatory Visit: Payer: Self-pay | Admitting: Family Medicine

## 2017-12-24 ENCOUNTER — Other Ambulatory Visit: Payer: Self-pay

## 2017-12-24 ENCOUNTER — Encounter (HOSPITAL_COMMUNITY): Payer: Self-pay | Admitting: Psychiatry

## 2017-12-24 VITALS — BP 118/78 | HR 70 | Ht 67.5 in | Wt 139.0 lb

## 2017-12-24 DIAGNOSIS — Z811 Family history of alcohol abuse and dependence: Secondary | ICD-10-CM | POA: Diagnosis not present

## 2017-12-24 DIAGNOSIS — Z818 Family history of other mental and behavioral disorders: Secondary | ICD-10-CM

## 2017-12-24 DIAGNOSIS — Z813 Family history of other psychoactive substance abuse and dependence: Secondary | ICD-10-CM | POA: Diagnosis not present

## 2017-12-24 DIAGNOSIS — F419 Anxiety disorder, unspecified: Secondary | ICD-10-CM

## 2017-12-24 DIAGNOSIS — F0630 Mood disorder due to known physiological condition, unspecified: Secondary | ICD-10-CM | POA: Diagnosis not present

## 2017-12-24 DIAGNOSIS — Z87891 Personal history of nicotine dependence: Secondary | ICD-10-CM

## 2017-12-24 DIAGNOSIS — F4321 Adjustment disorder with depressed mood: Secondary | ICD-10-CM

## 2017-12-24 DIAGNOSIS — F411 Generalized anxiety disorder: Secondary | ICD-10-CM

## 2017-12-24 DIAGNOSIS — Z634 Disappearance and death of family member: Secondary | ICD-10-CM | POA: Diagnosis not present

## 2017-12-24 DIAGNOSIS — Z1231 Encounter for screening mammogram for malignant neoplasm of breast: Secondary | ICD-10-CM

## 2017-12-24 MED ORDER — FLUOXETINE HCL 20 MG PO CAPS: 20.0000 mg | ORAL_CAPSULE | Freq: Every day | ORAL | 0 refills | Status: DC

## 2017-12-24 NOTE — Progress Notes (Signed)
Patient ID: Sharon AmbleNancy Caton, female   DOB: 1956-11-24, 62 y.o.   MRN: [id number]   [facility]  Sharon Ambleancy Pasquarella 1956-11-24  Date: 12/24/2017  Chief Complaint:  Follow Up. Depression and anxiety  History of Chief Complaint:   HPI Comments: [name] is a 62 y/o female with a past psychiatric history significant for Major Depression, Recurrent severe, generalized anxiety and bereavement. The patient returns  for psychiatric services and medication management.   Her depression dates back to 2008 when she lost her mom and also her brother was using drugs.  Last visit she was having tremors, leg shaking. We stopped abilify tapered down. She is better. Not shaking legs. She also didn't get back with buspar.  Kept prozac 20mg  and did not raise it klonopine helps sleep She is doing better. She was worried last time but now feels back to baseline . Likes her job  Not on oxycontine  And or subuxone   Anxiety relevant to . Husband retiring. Infrequent use of klonopine for anxieyt Grief: not worse Relationship: distant   . Modifying factors: meds and exercise.    Review of Systems  Constitutional: Negative for fever.  Cardiovascular: Negative for palpitations.  Gastrointestinal: Negative for nausea.  Skin: Negative for rash.  Neurological: Positive for tremors.     Vitals:   12/24/17 1235  BP: 118/78  Pulse: 70  Weight: 139 lb (63 kg)  Height: 5' 7.5" (1.715 m)   Physical Exam  Vitals reviewed.  Constitutional: She appears well-developed and well-nourished. No distress.  Skin: She is not diaphoretic.     Past Medical History: Reviewed Past Medical History:  Diagnosis Date  . Anxiety   . Arthritis    Right Hip  . Breast disorder     Rt Breast Calcifications  . Chronic cystitis   . Depression   . Dyspareunia   . Hypercholesterolemia   . Hypothyroidism   . Plantar fasciitis of left foot    2013  . Torn meniscus 11/13/15   Rt  knee   Allergies: Reviewed Allergies  Allergen Reactions  . Bupropion     Other reaction(s): Other Suicidal thoughts  . Sulfa Antibiotics Itching and Hives   Current Medications: Reviewed Current Outpatient Medications on File Prior to Visit  Medication Sig Dispense Refill  . clonazePAM (KLONOPIN) 0.5 MG tablet TAKE ONE TAB BY MOUTH DAILY AS NEEDED 90 tablet 0  . ibuprofen (ADVIL,MOTRIN) 800 MG tablet Take 1 tablet (800 mg total) by mouth every 8 (eight) hours as needed. 60 tablet 2  . lamoTRIgine (LAMICTAL) 25 MG tablet Take 2 tablets (50 mg total) by mouth daily. 180 tablet 0  . levothyroxine (SYNTHROID, LEVOTHROID) 88 MCG tablet TAKE 1 TABLET (88 MCG TOTAL) BY MOUTH DAILY BEFORE BREAKFAST. 90 tablet 2  . traMADol (ULTRAM-ER) 200 MG 24 hr tablet Take 200 mg by mouth daily.  0  . traZODone (DESYREL) 50 MG tablet Take 0.5-1 tablets (25-50 mg total) by mouth at bedtime as needed for sleep. 60 tablet 1  . valACYclovir (VALTREX) 500 MG tablet TAKE 1 TABLET (500 MG TOTAL) BY MOUTH DAILY AS NEEDED. 90 tablet 1  . Cross-Linked Hyaluronate (GEL-ONE) 30 MG/3ML PRSY Inject into the right knee x1, needs to be mailed to either the patient or my office. (Patient not taking: Reported on 12/24/2017) 1 Syringe 0  . [DISCONTINUED] ARIPiprazole (ABILIFY) 10 MG tablet Take 0.5 tablets (5 mg total) by mouth daily. 1 tablet 0   No current  facility-administered medications on file prior to visit.      Substance Abuse History in the last 12 months: Reviewed Social History   Tobacco Use  . Smoking status: Former Smoker    Last attempt to quit: 09/20/1982    Years since quitting: 35.2  . Smokeless tobacco: Never Used  Substance Use Topics  . Alcohol use: Yes    Alcohol/week: 0.0 oz    Comment: Use is 1-2 a month.  Caffeine: 2 cups a day   Family History: Reviewed Family History   Problem  Relation  Age of Onset   .  COPD     .  Aneurysm     .  Anxiety disorder  Mother    .  Depression  Mother     .  Alcohol abuse  Father    .  Heart murmur  Father    .  Drug abuse  Brother    .  Anxiety disorder  Brother    .  Depression  Brother    .  Dementia  Maternal Grandfather     Psychiatric Specialty Exam:  Objective: Appearance: Casual and Well Groomed   Eye Contact:: Good   Speech: Clear and Coherent and Normal Rate   Volume: Normal   Mood: better   Affect:  improved  Thought Process: Coherent, Logical   Orientation: Full   Thought Content: WDL   Suicidal Thoughts: No   Homicidal Thoughts: No   Judgement: Fair   Insight: Fair   Psychomotor Activity: Normal   Akathisia: No   Handed: Left   Memory: Immediate 3/3, recent: 1/3   . Assets: Communication Skills  Desire for Improvement  Financial Resources/Insurance  Housing  Leisure Time  Physical Health  Resilience  Transportation  Vocational/Educational    Laboratory/X-Ray  Psychological Evaluation(s)   NONE  NONE    Assessment:  AXIS I  Major Depression, Recurrent -. Generalized anxiety disorder. bereavement  AXIS II  No diagnosis   AXIS III  Past Medical History    Diagnosis  Date    .  Hypercholesterolemia     .  Arthritis       Right Hip    .  Hypothyroidism     .  Chronic cystitis     .  Decreased libido     .  Dyspareunia     .  Breast disorder       Rt Breast Calcifications    .  Depression     .  Anxiety     .  Plantar fasciitis of left foot       2013      AXIS IV  other psychosocial or environmental problems   AXIS V  65   Treatment Plan/Recommendations:   Plan of Care:  PLAN:  1. Affirm with the patient that the medications are taken as ordered. Patient  expressed understanding of how their medications were to be used.    Laboratory:    Psychotherapy: Therapy: brief supportive therapy provided.  Discussed psychosocial stressors in detail. More than 50% of the visit was spent on individual therapy/counseling.   Medications:  Continue  the following psychiatric medications as written  prior to this appointment with the following changes:: continue following  1. Depression: improved. Continue prozac at 20mg  lamictal at 50mg    2. Anxiety disorder; better. Not taking buspar, continue prozac  3. Grief. Not worse. Continue prozac  is not on abilify now No flushing or headaches. Fu 2 months  Thresa Ross, MD

## 2017-12-28 ENCOUNTER — Telehealth: Payer: Self-pay | Admitting: Family Medicine

## 2017-12-28 MED ORDER — SUVOREXANT 10 MG PO TABS: 10.0000 mg | ORAL_TABLET | Freq: Every day | ORAL | 0 refills | Status: DC

## 2017-12-28 NOTE — Telephone Encounter (Signed)
Pt advised.

## 2017-12-28 NOTE — Telephone Encounter (Signed)
Pt called clinic stating the Trazodone is not helping her sleep. She is using a full tab, and she will fall asleep and wake up within a couple hours. The cycle will repeat all night. Will route for recommendation.

## 2017-12-28 NOTE — Telephone Encounter (Signed)
Ok to stop the trazodone for one week and then can try Belsomra instead.  Has to do the wash out period. She can pick up coupon card or print one off the drug manufacturer website for a free trial.

## 2017-12-30 ENCOUNTER — Encounter: Payer: Self-pay | Admitting: Family Medicine

## 2018-01-04 ENCOUNTER — Other Ambulatory Visit: Payer: Self-pay | Admitting: Family Medicine

## 2018-01-06 ENCOUNTER — Telehealth: Payer: Self-pay | Admitting: *Deleted

## 2018-01-06 NOTE — Telephone Encounter (Signed)
Pre Authorization sent to cover my meds. [id number]

## 2018-01-07 ENCOUNTER — Ambulatory Visit (INDEPENDENT_AMBULATORY_CARE_PROVIDER_SITE_OTHER): Payer: 59

## 2018-01-07 DIAGNOSIS — Z1231 Encounter for screening mammogram for malignant neoplasm of breast: Secondary | ICD-10-CM | POA: Diagnosis not present

## 2018-01-07 NOTE — Telephone Encounter (Signed)
belsomra approved through insurance. Approved from 12/07/2017-01/06/2020. Pharmacy notified

## 2018-01-10 ENCOUNTER — Ambulatory Visit (INDEPENDENT_AMBULATORY_CARE_PROVIDER_SITE_OTHER): Payer: 59 | Admitting: Family Medicine

## 2018-01-10 ENCOUNTER — Encounter: Payer: Self-pay | Admitting: Family Medicine

## 2018-01-10 VITALS — BP 131/86 | HR 95 | Ht 68.0 in | Wt 136.0 lb

## 2018-01-10 DIAGNOSIS — F411 Generalized anxiety disorder: Secondary | ICD-10-CM | POA: Diagnosis not present

## 2018-01-10 DIAGNOSIS — G4709 Other insomnia: Secondary | ICD-10-CM

## 2018-01-10 DIAGNOSIS — K591 Functional diarrhea: Secondary | ICD-10-CM

## 2018-01-10 MED ORDER — SUVOREXANT 10 MG PO TABS: 10.0000 mg | ORAL_TABLET | Freq: Every day | ORAL | 0 refills | Status: DC

## 2018-01-10 MED ORDER — FLUOXETINE HCL 40 MG PO CAPS: 40.0000 mg | ORAL_CAPSULE | Freq: Every day | ORAL | 1 refills | Status: DC

## 2018-01-10 NOTE — Progress Notes (Signed)
Subjective:    Patient ID: [name], female    DOB: March 22, 1956, 62 y.o.   MRN: [id number]  HPI F/U GAD -currently on fluoxetine 20 mg a day on diazepam daily.  Does follow with Dr. Gilmore Laroche.  Having alot of anxiety and depression due to having to give her cat up to animal shelter due to it fighting with her other cats. Feels like she is depressed, hard to eat. Pt crying in the room. She has lost 9 lbs. She says she doesn't feel like eating.  She says this is really made her start grieving the loss of her father again.  She was his primary caretaker for about 3-1/2 years before he passed away.  Unfortunately she was in [place] when he passed away in [place].  She feels very guilty for not being there at his passing.  She is seeing her therapist regularly and her next appointment is next month.  She is currently on fluoxetine 20 mg.  Most days she only takes a quarter of the diazepam.  She is also been expensing more diarrhea than usual secondary to her mood.  She is been taking her probiotic and fiber.  She has lost about 9 pounds.  Insomnia-we will able to get the Belsomra approved through her insurance.  Starting with 10 mg. She has been dong well.    Review of Systems  BP 131/86   Pulse 95   Ht 5\' 8"  (1.727 m)   Wt 136 lb (61.7 kg)   SpO2 99%   BMI 20.68 kg/m     Allergies  Allergen Reactions  . Bupropion     Other reaction(s): Other Suicidal thoughts  . Sulfa Antibiotics Itching and Hives    Past Medical History:  Diagnosis Date  . Anxiety   . Arthritis    Right Hip  . Breast disorder     Rt Breast Calcifications  . Chronic cystitis   . Depression   . Dyspareunia   . Hypercholesterolemia   . Hypothyroidism   . Plantar fasciitis of left foot    2013  . Torn meniscus 11/13/15   Rt knee    Past Surgical History:  Procedure Laterality Date  . AUGMENTATION MAMMAPLASTY    . BREAST ENHANCEMENT SURGERY    . KNEE SURGERY Right 11/16  . plastic surgry to eyes     . Scoliosis repair with Harrington Rods      Social History   Socioeconomic History  . Marital status: Married    Spouse name: Not on file  . Number of children: Not on file  . Years of education: Not on file  . Highest education level: Not on file  Social Needs  . Financial resource strain: Not on file  . Food insecurity - worry: Not on file  . Food insecurity - inability: Not on file  . Transportation needs - medical: Not on file  . Transportation needs - non-medical: Not on file  Occupational History  . Occupation: CNA  Tobacco Use  . Smoking status: Former Smoker    Last attempt to quit: 09/20/1982    Years since quitting: 35.3  . Smokeless tobacco: Never Used  Substance and Sexual Activity  . Alcohol use: Yes    Alcohol/week: 0.0 oz    Comment: Use is 1-2 a month.  . Drug use: No  . Sexual activity: No    Partners: Male    Birth control/protection: None  Other Topics Concern  . 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      Family History  Problem Relation Age of Onset  . Anxiety disorder Mother   . Depression Mother   . COPD Father        smoker  . Alcohol abuse Father   . Heart murmur Father   . Aneurysm Unknown   . Drug abuse Brother   . Anxiety disorder Brother   . Depression Brother   . Dementia Maternal Grandfather     Outpatient Encounter Medications as of 01/10/2018  Medication Sig  . clonazePAM (KLONOPIN) 0.5 MG tablet TAKE ONE TAB BY MOUTH DAILY AS NEEDED  . FLUoxetine (PROZAC) 40 MG capsule Take 1 capsule (40 mg total) by mouth daily.  Marland Kitchen. ibuprofen (ADVIL,MOTRIN) 800 MG tablet Take 1 tablet (800 mg total) by mouth every 8 (eight) hours as needed.  . lamoTRIgine (LAMICTAL) 25 MG tablet Take 2 tablets (50 mg total) by mouth daily.  Marland Kitchen. levothyroxine (SYNTHROID, LEVOTHROID) 88 MCG tablet TAKE 1 TABLET (88 MCG TOTAL) BY MOUTH DAILY BEFORE BREAKFAST.  Marland Kitchen. Suvorexant (BELSOMRA) 10 MG TABS Take 10 mg by mouth  at bedtime.  . traMADol (ULTRAM-ER) 200 MG 24 hr tablet Take 200 mg by mouth daily.  . traZODone (DESYREL) 50 MG tablet Take 0.5-1 tablets (25-50 mg total) by mouth at bedtime as needed for sleep.  . valACYclovir (VALTREX) 500 MG tablet TAKE 1 TABLET (500 MG TOTAL) BY MOUTH DAILY AS NEEDED.  . [DISCONTINUED] BELSOMRA 10 MG TABS TAKE 10 MG BY MOUTH AT BEDTIME,PATIENT WILL HAVE COUPON  . [DISCONTINUED] FLUoxetine (PROZAC) 20 MG capsule Take 1 capsule (20 mg total) by mouth daily.  . [DISCONTINUED] Cross-Linked Hyaluronate (GEL-ONE) 30 MG/3ML PRSY Inject into the right knee x1, needs to be mailed to either the patient or my office. (Patient not taking: Reported on 12/24/2017)   No facility-administered encounter medications on file as of 01/10/2018.          Objective:   Physical Exam  Constitutional: She is oriented to person, place, and time. She appears well-developed and well-nourished.  HENT:  Head: Normocephalic and atraumatic.  Cardiovascular: Normal rate, regular rhythm and normal heart sounds.  Pulmonary/Chest: Effort normal and breath sounds normal.  Neurological: She is alert and oriented to person, place, and time.  Skin: Skin is warm and dry.  Psychiatric: She has a normal mood and affect. Her behavior is normal.        Assessment & Plan:  GAD - F/U in 8 weeks.  We will increase fluoxetine to 40 mg.  Right now she mostly takes a quarter of the clonazepam is okay to increase to half a tab and see if that is helpful in the short-term continue with her therapy and counseling.  Insomnia -continue Belsomra 10mg  .  90-day supply sent to pharmacy.  Diarrhea  - increasing her SSRi might help and she can try immodium if needed.

## 2018-01-11 ENCOUNTER — Telehealth: Payer: Self-pay | Admitting: Family Medicine

## 2018-01-11 NOTE — Telephone Encounter (Signed)
Called pt and asked about the medication she had a concern about. This was not printed/stated on the AVS.  Pt advised that she is to increase the fluoxetine to 40 mg.Chasitie Passey, Viann Shoveonya [name]'

## 2018-01-11 NOTE — Telephone Encounter (Signed)
Patient called lvm adv she was seen yesterday and has a question regarding a medication and would like a call back. Thanks

## 2018-01-12 ENCOUNTER — Other Ambulatory Visit: Payer: Self-pay | Admitting: *Deleted

## 2018-01-12 DIAGNOSIS — G4709 Other insomnia: Secondary | ICD-10-CM

## 2018-01-12 MED ORDER — TRAZODONE HCL 50 MG PO TABS: 25.0000 mg | ORAL_TABLET | Freq: Every evening | ORAL | 1 refills | Status: DC | PRN

## 2018-01-17 ENCOUNTER — Other Ambulatory Visit (HOSPITAL_COMMUNITY): Payer: Self-pay | Admitting: Psychiatry

## 2018-02-11 ENCOUNTER — Encounter (HOSPITAL_COMMUNITY): Payer: Self-pay | Admitting: Psychiatry

## 2018-02-11 ENCOUNTER — Telehealth: Payer: Self-pay | Admitting: Family Medicine

## 2018-02-11 ENCOUNTER — Ambulatory Visit (INDEPENDENT_AMBULATORY_CARE_PROVIDER_SITE_OTHER): Payer: 59 | Admitting: Psychiatry

## 2018-02-11 ENCOUNTER — Other Ambulatory Visit: Payer: Self-pay

## 2018-02-11 VITALS — BP 118/78 | HR 72 | Ht 68.0 in | Wt 132.0 lb

## 2018-02-11 DIAGNOSIS — Z811 Family history of alcohol abuse and dependence: Secondary | ICD-10-CM

## 2018-02-11 DIAGNOSIS — F419 Anxiety disorder, unspecified: Secondary | ICD-10-CM | POA: Diagnosis not present

## 2018-02-11 DIAGNOSIS — Z810 Family history of intellectual disabilities: Secondary | ICD-10-CM

## 2018-02-11 DIAGNOSIS — Z813 Family history of other psychoactive substance abuse and dependence: Secondary | ICD-10-CM | POA: Diagnosis not present

## 2018-02-11 DIAGNOSIS — F4321 Adjustment disorder with depressed mood: Secondary | ICD-10-CM | POA: Diagnosis not present

## 2018-02-11 DIAGNOSIS — F331 Major depressive disorder, recurrent, moderate: Secondary | ICD-10-CM | POA: Diagnosis not present

## 2018-02-11 DIAGNOSIS — Z634 Disappearance and death of family member: Secondary | ICD-10-CM

## 2018-02-11 DIAGNOSIS — F0630 Mood disorder due to known physiological condition, unspecified: Secondary | ICD-10-CM | POA: Diagnosis not present

## 2018-02-11 DIAGNOSIS — F411 Generalized anxiety disorder: Secondary | ICD-10-CM

## 2018-02-11 DIAGNOSIS — R251 Tremor, unspecified: Secondary | ICD-10-CM

## 2018-02-11 DIAGNOSIS — Z818 Family history of other mental and behavioral disorders: Secondary | ICD-10-CM | POA: Diagnosis not present

## 2018-02-11 MED ORDER — ARIPIPRAZOLE 5 MG PO TABS: 10.0000 mg | ORAL_TABLET | Freq: Every day | ORAL | 1 refills | Status: DC

## 2018-02-11 NOTE — Telephone Encounter (Signed)
Referral for neuro placed. Please inform pt.Heath GoldBarkley, Glennette Galster [name], CMA

## 2018-02-11 NOTE — Telephone Encounter (Signed)
Patient came in to inquire about a [place]. She said that she and Dr. Linford ArnoldMetheney spoke about it during her last visit. I was unable to find anything in the notes about a referral to a neurologist. Please advise. Thanks!

## 2018-02-11 NOTE — Progress Notes (Signed)
Patient ID: [name], female   DOB: [birth year], 62 y.o.   MRN: [id number]   [facility] Follow-up Outpatient Visit  [name] 03-03-1956  Date: 12/24/2017  Chief Complaint:  Follow Up. Depression and anxiety  History of Chief Complaint:   HPI Comments: [name] is a 62 y/o female with a past psychiatric history significant for Major Depression, Recurrent severe, generalized anxiety and bereavement. The patient returns  for psychiatric services and medication management.   Her depression dates back to 2008 when she lost her mom and also her brother was using drugs.   She is feeling down ,dysphoric since abilify stopped.  She reviewed prozac and other meds some have been there for more then 10 years. We talked about trintellix  As well Other option to start back small dose of abilify as it has helped and assess any worsening of tremor  Legs are not shaking    prozac is at 20mg  for now Was higher before  Not on oxycontine  And or subuxone   Anxiety relevant to . Husband retireing Also has done professional cuddling but it cost   Grief: not worse Relationship: distant but somewhat better   . Modifying factors: meds and exercise.    Review of Systems  Constitutional: Negative for fever.  Cardiovascular: Negative for chest pain.  Gastrointestinal: Negative for nausea.  Skin: Negative for rash.  Neurological: Positive for tremors.  Psychiatric/Behavioral: Positive for depression.     Vitals:   02/11/18 1222  BP: 118/78  Pulse: 72  Weight: 132 lb (59.9 kg)  Height: 5\' 8"  (1.727 m)   Physical Exam  Vitals reviewed.  Constitutional: She appears well-developed and well-nourished. No distress.  Skin: She is not diaphoretic.     Past Medical History: Reviewed Past Medical History:  Diagnosis Date  . Anxiety   . Arthritis    Right Hip  . Breast disorder     Rt Breast Calcifications  . Chronic cystitis   . Depression   . Dyspareunia    . Hypercholesterolemia   . Hypothyroidism   . Plantar fasciitis of left foot    2013  . Torn meniscus 11/13/15   Rt knee   Allergies: Reviewed Allergies  Allergen Reactions  . Bupropion     Other reaction(s): Other Suicidal thoughts  . Sulfa Antibiotics Itching and Hives   Current Medications: Reviewed Current Outpatient Medications on File Prior to Visit  Medication Sig Dispense Refill  . clonazePAM (KLONOPIN) 0.5 MG tablet TAKE ONE TAB BY MOUTH DAILY AS NEEDED 90 tablet 0  . FLUoxetine (PROZAC) 40 MG capsule Take 1 capsule (40 mg total) by mouth daily. 30 capsule 1  . ibuprofen (ADVIL,MOTRIN) 800 MG tablet Take 1 tablet (800 mg total) by mouth every 8 (eight) hours as needed. 60 tablet 2  . lamoTRIgine (LAMICTAL) 25 MG tablet Take 2 tablets (50 mg total) by mouth daily. 180 tablet 0  . levothyroxine (SYNTHROID, LEVOTHROID) 88 MCG tablet TAKE 1 TABLET (88 MCG TOTAL) BY MOUTH DAILY BEFORE BREAKFAST. 90 tablet 2  . traMADol (ULTRAM-ER) 200 MG 24 hr tablet Take 200 mg by mouth daily.  0  . traZODone (DESYREL) 50 MG tablet Take 0.5-1 tablets (25-50 mg total) by mouth at bedtime as needed for sleep. 90 tablet 1  . valACYclovir (VALTREX) 500 MG tablet TAKE 1 TABLET (500 MG TOTAL) BY MOUTH DAILY AS NEEDED. 90 tablet 1  . Suvorexant (BELSOMRA) 10 MG TABS Take 10 mg by mouth at bedtime. (Patient  not taking: Reported on 02/11/2018) 90 tablet 0   No current facility-administered medications on file prior to visit.      Substance Abuse History in the last 12 months: Reviewed Social History   Tobacco Use  . Smoking status: Former Smoker    Last attempt to quit: 09/20/1982    Years since quitting: 35.4  . Smokeless tobacco: Never Used  Substance Use Topics  . Alcohol use: Yes    Alcohol/week: 0.0 oz    Comment: Use is 1-2 a month.  Caffeine: 2 cups a day   Family History: Reviewed Family History   Problem  Relation  Age of Onset   .  COPD     .  Aneurysm     .  Anxiety disorder   Mother    .  Depression  Mother    .  Alcohol abuse  Father    .  Heart murmur  Father    .  Drug abuse  Brother    .  Anxiety disorder  Brother    .  Depression  Brother    .  Dementia  Maternal Grandfather     Psychiatric Specialty Exam:  Objective: Appearance: Casual and Well Groomed   Eye Contact:: Good   Speech: Clear and Coherent and Normal Rate   Volume: Normal   Mood: better   Affect:  improved  Thought Process: Coherent, Logical   Orientation: Full   Thought Content: WDL   Suicidal Thoughts: No   Homicidal Thoughts: No   Judgement: Fair   Insight: Fair   Psychomotor Activity: Normal   Akathisia: No   Handed: Left   Memory: Immediate 3/3, recent: 1/3   . Assets: Communication Skills  Desire for Improvement  Financial Resources/Insurance  Housing  Leisure Time  Physical Health  Resilience  Transportation  Vocational/Educational    Laboratory/X-Ray  Psychological Evaluation(s)   NONE  NONE    Assessment:  AXIS I  Major Depression, Recurrent -. Generalized anxiety disorder. bereavement  AXIS II  No diagnosis   AXIS III  Past Medical History    Diagnosis  Date    .  Hypercholesterolemia     .  Arthritis       Right Hip    .  Hypothyroidism     .  Chronic cystitis     .  Decreased libido     .  Dyspareunia     .  Breast disorder       Rt Breast Calcifications    .  Depression     .  Anxiety     .  Plantar fasciitis of left foot       2013      AXIS IV  other psychosocial or environmental problems   AXIS V  64   Treatment Plan/Recommendations:   Plan of Care:  PLAN:  1. Affirm with the patient that the medications are taken as ordered. Patient  expressed understanding of how their medications were to be used.    Laboratory:    Psychotherapy: Therapy: brief supportive therapy provided.  Discussed psychosocial stressors in detail. More than 50% of the visit was spent on individual therapy/counseling.   Medications:  Continue  the following  psychiatric medications as written prior to this appointment with the following changes:: continue following  1. Depression: feeling subdued. Agree to consider small dose of abilify 5mg  Stop if tremors gets worse. Will work on trintellix next if needed Continue lamictal  2. Anxiety  disorder; fluctutas. Continue prozac  3. Grief. Not worse. Continue prozac Fu 4 weeks              Thresa Ross, MD

## 2018-02-14 ENCOUNTER — Other Ambulatory Visit (HOSPITAL_COMMUNITY): Payer: Self-pay

## 2018-02-14 ENCOUNTER — Encounter: Payer: Self-pay | Admitting: Neurology

## 2018-02-14 ENCOUNTER — Telehealth (HOSPITAL_COMMUNITY): Payer: Self-pay

## 2018-02-14 MED ORDER — ARIPIPRAZOLE 5 MG PO TABS: 10.0000 mg | ORAL_TABLET | Freq: Every day | ORAL | 1 refills | Status: DC

## 2018-02-14 MED ORDER — LAMOTRIGINE 25 MG PO TABS: 50.0000 mg | ORAL_TABLET | Freq: Every day | ORAL | 0 refills | Status: DC

## 2018-02-14 NOTE — Telephone Encounter (Signed)
[facility] sent over a fax requesting a 90 day refill for Lamictal 25 mg. I sent over the refill. Patient was last seen on 02/11/18. Nothing further is needed at this time.

## 2018-02-14 NOTE — Progress Notes (Signed)
Dr. Gilmore LarocheAkhtar started Abilify 5 mg 2 tablets daily but only sent in 30 tablets. [facility] sent over a fax requesting clarification. I resent medication with #60 for Abilify 5 mg. Nothing further is needed at this time.

## 2018-02-15 ENCOUNTER — Telehealth: Payer: Self-pay | Admitting: Family Medicine

## 2018-02-15 ENCOUNTER — Telehealth: Payer: Self-pay

## 2018-02-15 NOTE — Telephone Encounter (Addendum)
Patient is requesting a letter of medical necessity explaining the circumstances for which she was prescribed oxycodone and hydrocodone. I tried to get more information to better understand what she needed, but that was all the information she provided.

## 2018-02-15 NOTE — Telephone Encounter (Signed)
Please call patient to find out what she needs. She left message that I could not understand what she was requesting. Thanks.

## 2018-02-15 NOTE — Telephone Encounter (Signed)
Called pt and asked that she rtn call in regards to the letter that she is requesting. We will need to know who, and what this letter is for exactly.Heath GoldBarkley, Edin Skarda [name], CMA

## 2018-02-16 NOTE — Telephone Encounter (Addendum)
Called pt back. She stated that her insurance was denied due to opioid abuse. She will need something going back for 5 yrs. She actually needs Medical records release and the letter of medical necessity for the medication. I asked her if she left the information for this at the front. She stated that she did not. She said that she would bring this information by our office .Heath GoldBarkley, Renard Caperton [name], CMA

## 2018-02-17 ENCOUNTER — Other Ambulatory Visit (HOSPITAL_COMMUNITY): Payer: Self-pay | Admitting: Psychiatry

## 2018-02-18 ENCOUNTER — Other Ambulatory Visit (HOSPITAL_COMMUNITY): Payer: Self-pay

## 2018-02-18 ENCOUNTER — Telehealth (HOSPITAL_COMMUNITY): Payer: Self-pay

## 2018-02-18 MED ORDER — ARIPIPRAZOLE 5 MG PO TABS: 10.0000 mg | ORAL_TABLET | Freq: Every day | ORAL | 0 refills | Status: DC

## 2018-02-18 NOTE — Telephone Encounter (Signed)
Pharmacy sent over a fax requesting a 90 day supply of Abilify since insurance will only pay for a 90 day rx. Sent over the 90 day supply per Dr. Gilmore LarocheAkhtar. Nothing further is needed at this time.

## 2018-03-03 NOTE — Progress Notes (Signed)
Subjective:   [name] was seen in consultation in the movement disorder clinic at the request of Agapito Games, MD.  The evaluation is for tremor. The records that were made available to me were reviewed.  No one accompanies the patient.  Pt is a 62 y.o. female with a hx of MDD, severe GAD, under the care of psychiatry, who presents for the evaluation of tremor. Psychiatry notes indicate hx of tremor.  Was on abilify in the past and restarted on 02/11/18 per records but she actually started in 02/20/18.  Was told to call if tremors get worse with that med.   Tremor started approximately 6 months ago, perhaps a little longer, and involves the R leg and some in the head.  Tremor is most noticeable when sitting and relaxing, it will "take off."     There is a family hx of tremor in her brother but she doesn't know etiology.    Affected by caffeine:  No. Affected by alcohol:  No. Affected by stress:  Yes.  , picks it up Affected by fatigue:  No. Spills soup if on spoon:  No. because isn't arm Spills glass of liquid if full:  No. Affects ADL's (tying shoes, brushing teeth, etc):  Yes.    Current/Previously tried tremor medications: klonopin (on for anxiety, takes daily at night for sleep)  Current medications that may exacerbate tremor:  abilify (states that she was only on it for 2 weeks in November and then only since 02/20/18; psychiatry records indicate a longer exposure period than patient reprots)  Outside reports reviewed: historical medical records, lab reports, office notes and referral letter/letters.  Allergies  Allergen Reactions  . Bupropion     Other reaction(s): Other Suicidal thoughts  . Sulfa Antibiotics Itching and Hives    Outpatient Encounter Medications as of 03/07/2018  Medication Sig  . ARIPiprazole (ABILIFY) 5 MG tablet Take 2 tablets (10 mg total) by mouth daily.  . clonazePAM (KLONOPIN) 0.5 MG tablet TAKE ONE TAB BY MOUTH DAILY AS NEEDED  . FLUoxetine  (PROZAC) 20 MG tablet Take 20 mg by mouth daily.  Marland Kitchen ibuprofen (ADVIL,MOTRIN) 800 MG tablet Take 1 tablet (800 mg total) by mouth every 8 (eight) hours as needed.  . lamoTRIgine (LAMICTAL) 25 MG tablet Take 2 tablets (50 mg total) by mouth daily.  Marland Kitchen levothyroxine (SYNTHROID, LEVOTHROID) 88 MCG tablet TAKE 1 TABLET (88 MCG TOTAL) BY MOUTH DAILY BEFORE BREAKFAST.  Marland Kitchen Probiotic Product (PROBIOTIC-10 PO) Take by mouth.  . Suvorexant (BELSOMRA) 10 MG TABS Take 10 mg by mouth at bedtime.  . traMADol (ULTRAM-ER) 200 MG 24 hr tablet Take 200 mg by mouth daily.  . traZODone (DESYREL) 50 MG tablet Take 0.5-1 tablets (25-50 mg total) by mouth at bedtime as needed for sleep.  . valACYclovir (VALTREX) 500 MG tablet TAKE 1 TABLET (500 MG TOTAL) BY MOUTH DAILY AS NEEDED.  . [DISCONTINUED] FLUoxetine (PROZAC) 40 MG capsule Take 1 capsule (40 mg total) by mouth daily.   No facility-administered encounter medications on file as of 03/07/2018.     Past Medical History:  Diagnosis Date  . Anxiety   . Arthritis    Right Hip  . Breast disorder     Rt Breast Calcifications  . Chronic cystitis   . Depression   . Dyspareunia   . Hypercholesterolemia   . Hypothyroidism   . Plantar fasciitis of left foot    2013  . Torn meniscus 11/13/15   Rt knee  Past Surgical History:  Procedure Laterality Date  . AUGMENTATION MAMMAPLASTY    . BLEPHAROPLASTY    . BREAST ENHANCEMENT SURGERY    . KNEE SURGERY Right 11/16  . Scoliosis repair with Harrington Rods      Social History   Socioeconomic History  . Marital status: Married    Spouse name: Not on file  . Number of children: Not on file  . Years of education: Not on file  . Highest education level: Not on file  Social Needs  . Financial resource strain: Not on file  . Food insecurity - worry: Not on file  . Food insecurity - inability: Not on file  . Transportation needs - medical: Not on file  . Transportation needs - non-medical: Not on file    Occupational History  . 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 Smoking status: Former Smoker    Last attempt to quit: 09/20/1982    Years since quitting: 35.4  . Smokeless tobacco: Never Used  Substance and Sexual Activity  . Alcohol use: Yes    Alcohol/week: 0.0 oz    Comment: Use is 1-2 a month.  . Drug use: No  . Sexual activity: No    Partners: Male    Birth control/protection: None  Other Topics Concern  . Not on file  Social History Narrative   She works in indepednet living with seniors.      Family Status  Relation Name Status  . Mother  Deceased  . Father  Deceased  . Unknown  (Not Specified)  . Brother 2 Alive  . MGF  (Not Specified)  . Sister 2 Alive    Review of Systems A complete 10 system ROS was obtained and was negative apart from what is mentioned.   Objective:   VITALS:   Vitals:   03/07/18 0933  BP: 90/60  Pulse: 62  SpO2: 98%  Weight: 135 lb (61.2 kg)  Height: 5\' 8"  (1.727 m)   Gen:  Appears stated age and in NAD. HEENT:  Normocephalic, atraumatic. The mucous membranes are moist. The superficial temporal arteries are without ropiness or tenderness. Cardiovascular: Regular rate and rhythm. Lungs: Clear to auscultation bilaterally. Neck: There are no carotid bruits noted bilaterally.  She has mild contraction of the right SCM when head is turned left.  There is a jerking motion to the head.  No head/neck tilt.  Minimal tremor with head to the right.    NEUROLOGICAL:  Orientation:  The patient is alert and oriented x 3.  Recent and remote memory are intact.  Attention span and concentration are normal.  Able to name objects and repeat without trouble.  Fund of knowledge is appropriate Cranial nerves: There is good facial symmetry. The pupils are equal round and reactive to light bilaterally. Fundoscopic exam reveals clear disc margins bilaterally. Extraocular muscles are intact and visual fields are full to  confrontational testing. Speech is fluent and clear. Soft palate rises symmetrically and there is no tongue deviation. Hearing is intact to conversational tone. Tone: Tone is good throughout.  There is no rigidity. Sensation: Sensation is intact to light touch and pinprick throughout (facial, trunk, extremities). Vibration is intact at the bilateral big toe. There is no extinction with double simultaneous stimulation. There is no sensory dermatomal level identified. Coordination:  The patient has no decremation, with any form of RAMS, including alternating supination and pronation of the forearm, hand opening and closing, finger taps, heel  taps and toe taps. Motor: Strength is 5/5 in the bilateral upper and lower extremities.  Shoulder shrug is equal bilaterally.  There is no pronator drift.  There are no fasciculations noted. DTR's: Deep tendon reflexes are 2/4 at the bilateral biceps, triceps, brachioradialis, patella and achilles.  Plantar responses are downgoing bilaterally. Gait and Station: The patient is able to ambulate without difficulty.  She has good arm swing.  MOVEMENT EXAM: Tremor:  There is tremor in the right leg.  At times it is mild and other times more significant.  Her tremor completely goes away with distraction procedures.  No significant tremor with Archimedes spirals.  Labs reviewed:  Lab Results  Component Value Date   TSH 0.62 11/26/2017     Chemistry      Component Value Date/Time   NA 141 11/26/2017 1438   K 3.6 11/26/2017 1438   CL 105 11/26/2017 1438   CO2 27 11/26/2017 1438   BUN 14 11/26/2017 1438   CREATININE 1.04 (H) 11/26/2017 1438      Component Value Date/Time   CALCIUM 10.0 11/26/2017 1438   ALKPHOS 52 05/11/2016 1228   AST 14 11/26/2017 1438   ALT 9 11/26/2017 1438   BILITOT 0.4 11/26/2017 1438     Lab Results  Component Value Date   VITAMINB12 926 11/26/2017      Assessment/Plan:   1.  Tremor  -Multifactorial.  Discussed the role  that antipsychotic medication, such as Abilify, can play with tremor.  I saw no evidence of parkinsonism, however.  Some aspects of the leg tremor appears somewhat nonphysiologic (completely distractible), but it is possible that the Abilify (taking for bipolar d/o) could play a role if taken longer than she reports (she reports not long, but records indicate longer period of time). It is also a distinct possiblity that anxiety/stress is causing/contributing to the tremor.  Regardless, she does not want anything done and I would not recommend anything at this point in time.   2.  Cervical dystonia  -She likely does have some mild cervical dystonia creating head motion. I talked to the patient about the nature and pathophysiology.  The patient is not bothered by this.  We talked about the fact that oral medications generally do not help.  We talked about the fact that Botox can help (and patient information/education was given), but I really would not recommend it in her right now given that it is not bothersome to her and would require injections every 3 months.  She was completely in agreement.  3.  Bipolar d/o  -f/u with psychiatry to discuss meds.  4.  Follow up as needed    CC:  Agapito GamesMetheney, Catherine D, MD

## 2018-03-07 ENCOUNTER — Encounter (HOSPITAL_COMMUNITY): Payer: Self-pay | Admitting: Psychiatry

## 2018-03-07 ENCOUNTER — Ambulatory Visit (INDEPENDENT_AMBULATORY_CARE_PROVIDER_SITE_OTHER): Payer: 59 | Admitting: Psychiatry

## 2018-03-07 ENCOUNTER — Ambulatory Visit (INDEPENDENT_AMBULATORY_CARE_PROVIDER_SITE_OTHER): Payer: 59 | Admitting: Neurology

## 2018-03-07 ENCOUNTER — Encounter: Payer: Self-pay | Admitting: Neurology

## 2018-03-07 VITALS — BP 108/78 | HR 98 | Ht 68.0 in | Wt 135.8 lb

## 2018-03-07 VITALS — BP 90/60 | HR 62 | Ht 68.0 in | Wt 135.0 lb

## 2018-03-07 DIAGNOSIS — Z811 Family history of alcohol abuse and dependence: Secondary | ICD-10-CM

## 2018-03-07 DIAGNOSIS — Z810 Family history of intellectual disabilities: Secondary | ICD-10-CM

## 2018-03-07 DIAGNOSIS — Z818 Family history of other mental and behavioral disorders: Secondary | ICD-10-CM

## 2018-03-07 DIAGNOSIS — F0630 Mood disorder due to known physiological condition, unspecified: Secondary | ICD-10-CM | POA: Diagnosis not present

## 2018-03-07 DIAGNOSIS — F4321 Adjustment disorder with depressed mood: Secondary | ICD-10-CM

## 2018-03-07 DIAGNOSIS — F411 Generalized anxiety disorder: Secondary | ICD-10-CM

## 2018-03-07 DIAGNOSIS — Z813 Family history of other psychoactive substance abuse and dependence: Secondary | ICD-10-CM

## 2018-03-07 DIAGNOSIS — G251 Drug-induced tremor: Secondary | ICD-10-CM

## 2018-03-07 DIAGNOSIS — F331 Major depressive disorder, recurrent, moderate: Secondary | ICD-10-CM | POA: Diagnosis not present

## 2018-03-07 DIAGNOSIS — F3160 Bipolar disorder, current episode mixed, unspecified: Secondary | ICD-10-CM

## 2018-03-07 DIAGNOSIS — R251 Tremor, unspecified: Secondary | ICD-10-CM | POA: Diagnosis not present

## 2018-03-07 DIAGNOSIS — Z87891 Personal history of nicotine dependence: Secondary | ICD-10-CM

## 2018-03-07 DIAGNOSIS — Z634 Disappearance and death of family member: Secondary | ICD-10-CM | POA: Diagnosis not present

## 2018-03-07 DIAGNOSIS — F419 Anxiety disorder, unspecified: Secondary | ICD-10-CM | POA: Diagnosis not present

## 2018-03-07 DIAGNOSIS — G243 Spasmodic torticollis: Secondary | ICD-10-CM

## 2018-03-07 MED ORDER — VORTIOXETINE HBR 10 MG PO TABS: 10.0000 mg | ORAL_TABLET | Freq: Every day | ORAL | 0 refills | Status: DC

## 2018-03-07 NOTE — Patient Instructions (Signed)
You have mild cervical dystonia

## 2018-03-07 NOTE — Patient Instructions (Signed)
Stop prozac in 3 days.  Starting trintellix today Take half of abilify for 5 days and then stop

## 2018-03-07 NOTE — Progress Notes (Signed)
Patient ID: [name], female   DOB: 06-05-1956, 63 y.o.   MRN: [id number]   [facility] Follow-up Outpatient Visit  [name] 10-02-1956  Date: 03/07/2018  Chief Complaint:  Follow Up. Depression and anxiety  History of Chief Complaint:   HPI Comments: [name] is a 62 y/o female with a past psychiatric history significant for Major Depression, Recurrent severe, generalized anxiety and bereavement. The patient returns  for psychiatric services and medication management.   Her depression dates back to 2008 when she lost her mom and also her brother was using drugs.  Re started abilify last visit. Still feels dysphoric but same not worse  decreased energy and decreased motivation  tremor has not gone worse. Has seen neurologist. Some option of botox and reviews were done. Patient not interested Etiology of tremor still not sure and timeline is not parallel to abilify either   Right leg shakes.    prozac is at 20mg  for now Was higher before  Not on oxycontine  And or subuxone  Endorses worries but more so feeling down Anxiety relevant to . Husband retireing   Grief: not worse Relationship: distant but somewhat better   . Modifying factors: exercise when she can  Review of Systems  Constitutional: Negative for fever.  Cardiovascular: Negative for palpitations.  Gastrointestinal: Negative for nausea.  Skin: Negative for rash.  Neurological: Positive for tremors.  Psychiatric/Behavioral: Positive for depression.     Vitals:   03/07/18 1320  BP: 108/78  Pulse: 98  Weight: 135 lb 12.8 oz (61.6 kg)  Height: 5\' 8"  (1.727 m)   Physical Exam  Vitals reviewed.  Constitutional: She appears well-developed and well-nourished. No distress.  Skin: She is not diaphoretic.     Past Medical History: Reviewed Past Medical History:  Diagnosis Date  . Anxiety   . Arthritis    Right Hip  . Breast disorder     Rt Breast Calcifications  . Chronic  cystitis   . Depression   . Dyspareunia   . Hypercholesterolemia   . Hypothyroidism   . Plantar fasciitis of left foot    2013  . Torn meniscus 11/13/15   Rt knee   Allergies: Reviewed Allergies  Allergen Reactions  . Bupropion     Other reaction(s): Other Suicidal thoughts  . Sulfa Antibiotics Itching and Hives   Current Medications: Reviewed Current Outpatient Medications on File Prior to Visit  Medication Sig Dispense Refill  . clonazePAM (KLONOPIN) 0.5 MG tablet TAKE ONE TAB BY MOUTH DAILY AS NEEDED 90 tablet 0  . ibuprofen (ADVIL,MOTRIN) 800 MG tablet Take 1 tablet (800 mg total) by mouth every 8 (eight) hours as needed. 60 tablet 2  . lamoTRIgine (LAMICTAL) 25 MG tablet Take 2 tablets (50 mg total) by mouth daily. 180 tablet 0  . levothyroxine (SYNTHROID, LEVOTHROID) 88 MCG tablet TAKE 1 TABLET (88 MCG TOTAL) BY MOUTH DAILY BEFORE BREAKFAST. 90 tablet 2  . Suvorexant (BELSOMRA) 10 MG TABS Take 10 mg by mouth at bedtime. 90 tablet 0  . traMADol (ULTRAM-ER) 200 MG 24 hr tablet Take 200 mg by mouth daily.  0  . traZODone (DESYREL) 50 MG tablet Take 0.5-1 tablets (25-50 mg total) by mouth at bedtime as needed for sleep. 90 tablet 1  . valACYclovir (VALTREX) 500 MG tablet TAKE 1 TABLET (500 MG TOTAL) BY MOUTH DAILY AS NEEDED. 90 tablet 1  . [DISCONTINUED] ARIPiprazole (ABILIFY) 5 MG tablet Take 2 tablets (10 mg total) by mouth daily. 180  tablet 0   No current facility-administered medications on file prior to visit.      Substance Abuse History in the last 12 months: Reviewed Social History   Tobacco Use  . Smoking status: Former Smoker    Last attempt to quit: 09/20/1982    Years since quitting: 35.4  . Smokeless tobacco: Never Used  Substance Use Topics  . Alcohol use: Yes    Alcohol/week: 0.0 oz    Comment: Use is 1-2 a month.  Caffeine: 2 cups a day   Family History: Reviewed Family History   Problem  Relation  Age of Onset   .  COPD     .  Aneurysm     .   Anxiety disorder  Mother    .  Depression  Mother    .  Alcohol abuse  Father    .  Heart murmur  Father    .  Drug abuse  Brother    .  Anxiety disorder  Brother    .  Depression  Brother    .  Dementia  Maternal Grandfather     Psychiatric Specialty Exam:  Objective: Appearance: Casual and Well Groomed   Eye Contact:: Good   Speech: Clear and Coherent and Normal Rate   Volume: Normal   Mood: better   Affect:  improved  Thought Process: Coherent, Logical   Orientation: Full   Thought Content: WDL   Suicidal Thoughts: No   Homicidal Thoughts: No   Judgement: Fair   Insight: Fair   Psychomotor Activity: Normal   Akathisia: No   Handed: Left   Memory: Immediate 3/3, recent: 1/3   . Assets: Communication Skills  Desire for Improvement  Financial Resources/Insurance  Housing  Leisure Time  Physical Health  Resilience  Transportation  Vocational/Educational    Laboratory/X-Ray  Psychological Evaluation(s)   NONE  NONE    Assessment:  AXIS I  Major Depression, Recurrent -. Generalized anxiety disorder. bereavement  AXIS II  No diagnosis   AXIS III  Past Medical History    Diagnosis  Date    .  Hypercholesterolemia     .  Arthritis       Right Hip    .  Hypothyroidism     .  Chronic cystitis     .  Decreased libido     .  Dyspareunia     .  Breast disorder       Rt Breast Calcifications    .  Depression     .  Anxiety     .  Plantar fasciitis of left foot       2013      AXIS IV  other psychosocial or environmental problems   AXIS V  64   Treatment Plan/Recommendations:   Plan of Care:  PLAN:  1. Affirm with the patient that the medications are taken as ordered. Patient  expressed understanding of how their medications were to be used.    Laboratory:    Psychotherapy: Therapy: brief supportive therapy provided.  Discussed psychosocial stressors in detail. More than 50% of the visit was spent on individual therapy/counseling.   Medications:  Continue   the following psychiatric medications as written prior to this appointment with the following changes:: continue following  1. Depression: feeling subdued. Will taper off abilify in one week. Will taper off prozac in 3-4 days.  Start trintellix 5mg  increase to 10 mg in one week Continue lamictal 2. Anxiety disorder; fluctuates  will start trintellix. Continue low dose klonopine 3. Grief. Not worse.  Encouraged activitites and ME time goals Reviewed side effects and concerns were addressed  Fu 4 weeks              Thresa Ross, MD

## 2018-03-08 ENCOUNTER — Other Ambulatory Visit: Payer: Self-pay | Admitting: Family Medicine

## 2018-03-08 NOTE — Telephone Encounter (Signed)
Pt looked up in [place] controlled database. She last refilled medication on 12/15/17 #90. Will fwd to pcp for signature .Heath GoldBarkley, Raiana Pharris [name], CMA

## 2018-03-14 ENCOUNTER — Ambulatory Visit: Payer: Self-pay | Admitting: Family Medicine

## 2018-03-17 ENCOUNTER — Encounter: Payer: Self-pay | Admitting: *Deleted

## 2018-03-17 ENCOUNTER — Telehealth: Payer: Self-pay | Admitting: Family Medicine

## 2018-03-17 NOTE — Telephone Encounter (Signed)
Letter written and faxed confirmation received.Laureen Ochs.Ruddy Swire, Viann Shoveonya [name], CMA

## 2018-03-17 NOTE — Telephone Encounter (Signed)
Pt called.  She's following up on a  letter for Medical Necessity. She came by some time on the week of  3/17 to request letter.

## 2018-03-17 NOTE — Telephone Encounter (Signed)
What kind of letter does she need. This is the first I have heard about this.

## 2018-03-21 ENCOUNTER — Other Ambulatory Visit: Payer: Self-pay | Admitting: Family Medicine

## 2018-03-21 ENCOUNTER — Ambulatory Visit: Payer: Self-pay | Admitting: Obstetrics & Gynecology

## 2018-03-22 ENCOUNTER — Telehealth: Payer: Self-pay | Admitting: Family Medicine

## 2018-03-22 DIAGNOSIS — M545 Low back pain: Principal | ICD-10-CM

## 2018-03-22 DIAGNOSIS — G8929 Other chronic pain: Secondary | ICD-10-CM

## 2018-03-22 NOTE — Telephone Encounter (Signed)
OK. Thank you.  We can update Care Team once she has been assigned a new provider there.

## 2018-03-22 NOTE — Telephone Encounter (Signed)
Pt called and advised she is transferring pain management Providers to [facility], fax [phone]. [facility] [place] She has copies of all her records from [facility].

## 2018-03-22 NOTE — Telephone Encounter (Signed)
Referral signed.

## 2018-04-01 ENCOUNTER — Telehealth: Payer: Self-pay | Admitting: Family Medicine

## 2018-04-01 NOTE — Telephone Encounter (Signed)
Patient advised.

## 2018-04-01 NOTE — Telephone Encounter (Signed)
Left VM for Pt to return clinic call. We received information that the pain management clinic she wanted to go to will not accept her. We need to see if there is another pain management clinic she would like to try. Left callback information.

## 2018-04-07 ENCOUNTER — Other Ambulatory Visit (HOSPITAL_COMMUNITY): Payer: Self-pay | Admitting: Psychiatry

## 2018-04-11 ENCOUNTER — Ambulatory Visit (HOSPITAL_COMMUNITY): Payer: Self-pay | Admitting: Psychiatry

## 2018-04-15 ENCOUNTER — Ambulatory Visit (HOSPITAL_COMMUNITY): Payer: Self-pay | Admitting: Psychiatry

## 2018-04-29 ENCOUNTER — Ambulatory Visit (HOSPITAL_COMMUNITY): Payer: Self-pay | Admitting: Psychiatry

## 2018-05-04 ENCOUNTER — Telehealth (HOSPITAL_COMMUNITY): Payer: Self-pay | Admitting: Psychiatry

## 2018-05-04 MED ORDER — VORTIOXETINE HBR 20 MG PO TABS: 20.0000 mg | ORAL_TABLET | Freq: Every day | ORAL | 0 refills | Status: DC

## 2018-05-04 NOTE — Telephone Encounter (Signed)
I have talked to her. Mothers day and her mom death [name] made her feel more low Will increase trintellix to . Have sent to [facility] pharmacy on file. She needs to keep her busy and have good support system Call back for concerns or make appointment.  Not suicidal

## 2018-05-04 NOTE — Telephone Encounter (Signed)
Pt is very tearful. She states she is extremely depressed due to the death of her mom. She states she can not go on like this. Pt is not SI/HI. She refused an apt due to change of insurance (she does not have insurance information and does not want to re-file). She request Gilmore Laroche to call her in something to help her get though.   Please advise.  [phone]

## 2018-05-07 ENCOUNTER — Other Ambulatory Visit (HOSPITAL_COMMUNITY): Payer: Self-pay | Admitting: Psychiatry

## 2018-05-09 ENCOUNTER — Other Ambulatory Visit (HOSPITAL_COMMUNITY): Payer: Self-pay | Admitting: Psychiatry

## 2018-05-11 MED ORDER — LAMOTRIGINE 25 MG PO TABS: 50.0000 mg | ORAL_TABLET | Freq: Every day | ORAL | 0 refills | Status: DC

## 2018-05-13 ENCOUNTER — Other Ambulatory Visit (HOSPITAL_COMMUNITY): Payer: Self-pay | Admitting: Psychiatry

## 2018-05-30 ENCOUNTER — Ambulatory Visit (HOSPITAL_COMMUNITY): Payer: Self-pay | Admitting: Psychiatry

## 2018-06-03 ENCOUNTER — Telehealth: Payer: Self-pay

## 2018-06-03 ENCOUNTER — Encounter (HOSPITAL_COMMUNITY): Payer: Self-pay | Admitting: Psychiatry

## 2018-06-03 ENCOUNTER — Other Ambulatory Visit: Payer: Self-pay

## 2018-06-03 ENCOUNTER — Ambulatory Visit (INDEPENDENT_AMBULATORY_CARE_PROVIDER_SITE_OTHER): Payer: No Typology Code available for payment source | Admitting: Psychiatry

## 2018-06-03 VITALS — BP 116/64 | HR 69 | Ht 68.0 in | Wt 129.0 lb

## 2018-06-03 DIAGNOSIS — F0630 Mood disorder due to known physiological condition, unspecified: Secondary | ICD-10-CM

## 2018-06-03 DIAGNOSIS — F4321 Adjustment disorder with depressed mood: Secondary | ICD-10-CM

## 2018-06-03 DIAGNOSIS — F411 Generalized anxiety disorder: Secondary | ICD-10-CM

## 2018-06-03 MED ORDER — VORTIOXETINE HBR 20 MG PO TABS: 20.0000 mg | ORAL_TABLET | Freq: Every day | ORAL | 2 refills | Status: DC

## 2018-06-03 MED ORDER — LAMOTRIGINE 25 MG PO TABS: 50.0000 mg | ORAL_TABLET | Freq: Every day | ORAL | 2 refills | Status: DC

## 2018-06-03 MED ORDER — SUVOREXANT 15 MG PO TABS: 15.0000 mg | ORAL_TABLET | Freq: Every day | ORAL | 2 refills | Status: DC

## 2018-06-03 NOTE — Telephone Encounter (Signed)
Patient advised of recommendations.  

## 2018-06-03 NOTE — Telephone Encounter (Signed)
Pt called stating that she has been taking Belsomra for sleep and it has been working up until recently. States she always goes to be around 9 o'clock and has been waking up anywhere between 11 o'clock and 2 o'clcock and cant go back to sleep.   Pt reports that she has been taking a 1/2 Xanax upon waking up so that she can go back to sleep. She is usually awake for about an hour or so at a time.  Wanting to know if she may need a different or stronger sleep medication.   Please advise.

## 2018-06-03 NOTE — Telephone Encounter (Signed)
That is definitely unusual.  It does not usually quit working.  Just make sure to that she is looking at her sleep hygiene and making sure that she has not made any major changes such as increasing caffeine intake etc.  I would try to avoid getting into a pattern of taking the clonazepam the middle the night because it can cause a rebound phenomenon.  I did send over prescription for the 15 mg Belsomra so that we can see if maybe that helps.

## 2018-06-03 NOTE — Progress Notes (Signed)
Patient ID: [name], female   DOB: 1956/02/23, 62 y.o.   MRN: [id number]   [facility]  Sharon Ambleancy Riordan 1956/02/23  Date: 06/03/2018  Chief Complaint:  Follow Up. Depression and anxiety  History of Chief Complaint:   HPI Comments: [name] is a 62 y/o female with a past psychiatric history significant for Major Depression, Recurrent severe, generalized anxiety and bereavement. The patient returns  for psychiatric services and medication management.   Her depression dates back to 2008 when she lost her mom and also her brother was using drugs.anniverasries are tough around April  Got depressed in April but now recovering  trintellix now at 20mg  is helping No rash   tremor has not gone worse. Has seen neurologist. Some option of botox and reviews were done. Patient not interested Etiology of tremor still not sure and timeline is not parallel to abilify either   Not on oxycontine  And or subuxone  Endorses worries but less intesne  Distant relationship with husband but sleeps together.  Husband retireing   Grief: not worse    . Modifying factors: exercise when she can  Review of Systems  Constitutional: Negative for fever.  Cardiovascular: Negative for chest pain and palpitations.  Gastrointestinal: Negative for nausea.  Skin: Negative for rash.  Neurological: Positive for tremors.     Vitals:   06/03/18 1045  BP: 116/64  Pulse: 69  Weight: 129 lb (58.5 kg)  Height: 5\' 8"  (1.727 m)   Physical Exam  Vitals reviewed.  Constitutional: She appears well-developed and well-nourished. No distress.  Skin: She is not diaphoretic.     Past Medical History: Reviewed Past Medical History:  Diagnosis Date  . Anxiety   . Arthritis    Right Hip  . Breast disorder     Rt Breast Calcifications  . Chronic cystitis   . Depression   . Dyspareunia   . Hypercholesterolemia   . Hypothyroidism   . Plantar fasciitis of left  foot    2013  . Torn meniscus 11/13/15   Rt knee   Allergies: Reviewed Allergies  Allergen Reactions  . Bupropion     Other reaction(s): Other Suicidal thoughts  . Sulfa Antibiotics Itching and Hives   Current Medications: Reviewed Current Outpatient Medications on File Prior to Visit  Medication Sig Dispense Refill  . clonazePAM (KLONOPIN) 0.5 MG tablet TAKE ONE TAB BY MOUTH DAILY AS NEEDED 90 tablet 0  . ibuprofen (ADVIL,MOTRIN) 800 MG tablet Take 1 tablet (800 mg total) by mouth every 8 (eight) hours as needed. 60 tablet 2  . levothyroxine (SYNTHROID, LEVOTHROID) 88 MCG tablet Take 1 tablet (88 mcg total) by mouth daily before breakfast. Due for lab work 30 tablet 0  . Probiotic Product (PROBIOTIC-10 PO) Take by mouth.    . Suvorexant (BELSOMRA) 10 MG TABS Take 10 mg by mouth at bedtime. 90 tablet 0  . traMADol (ULTRAM-ER) 200 MG 24 hr tablet Take 200 mg by mouth daily.  0  . valACYclovir (VALTREX) 500 MG tablet TAKE 1 TABLET (500 MG TOTAL) BY MOUTH DAILY AS NEEDED. 90 tablet 1  . [DISCONTINUED] traZODone (DESYREL) 50 MG tablet Take 0.5-1 tablets (25-50 mg total) by mouth at bedtime as needed for sleep. 90 tablet 1  . [DISCONTINUED] ARIPiprazole (ABILIFY) 5 MG tablet Take 2 tablets (10 mg total) by mouth daily. 180 tablet 0  . [DISCONTINUED] FLUoxetine (PROZAC) 20 MG tablet Take 20 mg by mouth daily.     No  current facility-administered medications on file prior to visit.      Substance Abuse History in the last 12 months: Reviewed Social History   Tobacco Use  . Smoking status: Former Smoker    Last attempt to quit: 09/20/1982    Years since quitting: 35.7  . Smokeless tobacco: Never Used  Substance Use Topics  . Alcohol use: Yes    Alcohol/week: 0.0 oz    Comment: Use is 1-2 a month.  Caffeine: 2 cups a day   Family History: Reviewed Family History   Problem  Relation  Age of Onset   .  COPD     .  Aneurysm     .  Anxiety disorder  Mother    .  Depression   Mother    .  Alcohol abuse  Father    .  Heart murmur  Father    .  Drug abuse  Brother    .  Anxiety disorder  Brother    .  Depression  Brother    .  Dementia  Maternal Grandfather     Psychiatric Specialty Exam:  Objective: Appearance: Casual and Well Groomed   Eye Contact:: Good   Speech: Clear and Coherent and Normal Rate   Volume: Normal   Mood: fair   Affect:  improved  Thought Process: Coherent, Logical   Orientation: Full   Thought Content: WDL   Suicidal Thoughts: No   Homicidal Thoughts: No   Judgement: Fair   Insight: Fair   Psychomotor Activity: Normal   Akathisia: No   Handed: Left   Memory: Immediate 3/3, recent: 1/3   . Assets: Communication Skills  Desire for Improvement  Financial Resources/Insurance  Housing  Leisure Time  Physical Health  Resilience  Transportation  Vocational/Educational    Laboratory/X-Ray  Psychological Evaluation(s)   NONE  NONE    Assessment:  AXIS I  Major Depression, Recurrent -. Generalized anxiety disorder. bereavement  AXIS II  No diagnosis   AXIS III  Past Medical History    Diagnosis  Date    .  Hypercholesterolemia     .  Arthritis       Right Hip    .  Hypothyroidism     .  Chronic cystitis     .  Decreased libido     .  Dyspareunia     .  Breast disorder       Rt Breast Calcifications    .  Depression     .  Anxiety     .  Plantar fasciitis of left foot       2013      AXIS IV  other psychosocial or environmental problems   AXIS V  64   Treatment Plan/Recommendations:   Plan of Care:  PLAN:  1. Affirm with the patient that the medications are taken as ordered. Patient  expressed understanding of how their medications were to be used.    Laboratory:    Psychotherapy: Therapy: brief supportive therapy provided.  Discussed psychosocial stressors in detail. More than 50% of the visit was spent on individual therapy/counseling.   Medications:  Continue  the following psychiatric medications as  written prior to this appointment with the following changes:: continue following  1. Depression: less subdued. Continue trintellix 20mg   Continue lamictal 2. Anxiety disorder; fluctutaes Continue low dose klonopine 3. Grief. Not worse. Gets worse around April Likes her job Encouraged activitites and ME time goals Reviewed side effects and concerns  were addressed  [id number]             Thresa Ross, MD

## 2018-06-09 ENCOUNTER — Other Ambulatory Visit: Payer: Self-pay | Admitting: Family Medicine

## 2018-06-16 ENCOUNTER — Telehealth: Payer: Self-pay

## 2018-06-16 NOTE — Telephone Encounter (Signed)
Ok thanks...she is going to wait til Saturday. -WJC/CCMA

## 2018-06-16 NOTE — Telephone Encounter (Signed)
Scription was already sent on June 20 so if they are not refilling it then that is per her sure insurance policy.  If she is having a lot of increased anxiety we can certainly work on trying to get her in with a therapist or counselor.

## 2018-06-16 NOTE — Telephone Encounter (Signed)
Pt Is having real bad anxiety and she stated that she cant get refills until saturday so wanted to know if she can have two days worth. (Clonazepam). She wanted you to know that she has stopped taking it with the sleeping pill. -WJC/CCMA

## 2018-06-17 ENCOUNTER — Telehealth: Payer: Self-pay

## 2018-06-17 NOTE — Telephone Encounter (Signed)
Pt left msg stating that since she is having so many anxiety attacks.. Does Dr Linford ArnoldMetheney think she would benefit from also having an inhaler for when attacks occur?  Please advise

## 2018-06-20 NOTE — Telephone Encounter (Signed)
Called patient and informed her of MD instructions. Patient verbalized understanding. KG LPN

## 2018-06-20 NOTE — Telephone Encounter (Signed)
And that she has an underlying diagnosis of asthma I do not think that it would help.  The shortness of breath is probably more just the sensation from the anxiety versus true bronchospasm which happens when she gets around things that can affect her breathing like to secondhand smoke etc.

## 2018-06-27 ENCOUNTER — Ambulatory Visit (INDEPENDENT_AMBULATORY_CARE_PROVIDER_SITE_OTHER): Payer: No Typology Code available for payment source | Admitting: Obstetrics & Gynecology

## 2018-06-27 ENCOUNTER — Encounter: Payer: Self-pay | Admitting: Obstetrics & Gynecology

## 2018-06-27 VITALS — BP 118/70 | HR 72 | Resp 16 | Ht 68.0 in | Wt 128.0 lb

## 2018-06-27 DIAGNOSIS — M8580 Other specified disorders of bone density and structure, unspecified site: Secondary | ICD-10-CM

## 2018-06-27 DIAGNOSIS — Z01411 Encounter for gynecological examination (general) (routine) with abnormal findings: Secondary | ICD-10-CM

## 2018-06-27 DIAGNOSIS — Z01419 Encounter for gynecological examination (general) (routine) without abnormal findings: Secondary | ICD-10-CM | POA: Diagnosis not present

## 2018-06-27 NOTE — Progress Notes (Signed)
Subjective:     [name] is a 62 y.o. female here for a routine exam.  Current complaints: treated for anxiety with Dr. Darra LisMethaney.  Doing better.  Not sexually active currently.  Up to date on Mammo and colonoscopy.  Pt needs to start taking Ca++and Vit D.   Gynecologic History No LMP recorded. Patient is postmenopausal. Contraception: post menopausal status Last Pap: 2017. Results were: normal Last mammogram: 12/2017. Results were: normal  [email or web address].[email or web address] History OB History  Gravida Para Term Preterm AB Living  0            SAB TAB Ectopic Multiple Live Births                The following portions of the patient's history were reviewed and updated as appropriate: allergies, current medications, past family history, past medical history, past social history, past surgical history and problem list.  Review of Systems Pertinent items noted in HPI and remainder of comprehensive ROS otherwise negative.    Objective:   Vitals:   06/27/18 1304  BP: 118/70  Pulse: 72  Resp: 16  Weight: 128 lb (58.1 kg)  Height: 5\' 8"  (1.727 m)   Vitals:  WNL General appearance: alert, cooperative and no distress  HEENT: Normocephalic, without obvious abnormality, atraumatic Eyes: negative Throat: lips, mucosa, and tongue normal; teeth and gums normal  Respiratory: Clear to auscultation bilaterally  CV: Regular rate and rhythm  Breasts:  Normal appearance, no masses or tenderness, no nipple retraction or dimpling; implants  GI: Soft, non-tender; bowel sounds normal; no masses,  no organomegaly  GU: External Genitalia:  Tanner V, no lesion Urethra:  No prolapse   Vagina: Atrohpic, pain with speculum; pt not sexually active and doesn't bother her  Cervix: No CMT, no lesion  Uterus:  Normal size and contour, non tender  Adnexa: Normal, no masses, non tender  Musculoskeletal: No edema, redness or tenderness in the calves or thighs  Skin: No lesions or rash  Lymphatic: Axillary adenopathy:  none     Psychiatric: Normal mood and behavior      Assessment:    Healthy female exam.   Atrophic vaginitis   Plan:    Pap due next year Mammogram yearly. Dexa ordered to day for f/u osteopenia Restart Ca++ and Vit D Pt to call if vaginal symptoms occur.

## 2018-06-27 NOTE — Progress Notes (Signed)
e

## 2018-07-01 ENCOUNTER — Other Ambulatory Visit: Payer: No Typology Code available for payment source

## 2018-07-01 ENCOUNTER — Telehealth: Payer: Self-pay

## 2018-07-01 NOTE — Telephone Encounter (Signed)
[name] would like to switch insomnia medication because the Belsomra causing nightmares. Please advise.

## 2018-07-04 NOTE — Telephone Encounter (Signed)
I added it to her intolerance list. She needs f/u appt.

## 2018-07-04 NOTE — Telephone Encounter (Signed)
Patient advised of recommendations.  

## 2018-07-05 ENCOUNTER — Telehealth: Payer: Self-pay

## 2018-07-05 DIAGNOSIS — M545 Low back pain, unspecified: Secondary | ICD-10-CM

## 2018-07-05 DIAGNOSIS — G8929 Other chronic pain: Secondary | ICD-10-CM

## 2018-07-05 NOTE — Telephone Encounter (Signed)
OK to place order for new referral.

## 2018-07-05 NOTE — Telephone Encounter (Signed)
Harriett Sineancy called and states she needs new Pain Management because her insurance has changed. She would like to go to [facility]. Please advise.    [facility]  [place]   Address: [address], [place], [place] [address] Phone: [phone]

## 2018-07-05 NOTE — Telephone Encounter (Signed)
Referral placed. Left VM with status update for Pt.

## 2018-07-13 ENCOUNTER — Other Ambulatory Visit: Payer: Self-pay | Admitting: Family Medicine

## 2018-07-22 ENCOUNTER — Ambulatory Visit (INDEPENDENT_AMBULATORY_CARE_PROVIDER_SITE_OTHER): Payer: No Typology Code available for payment source | Admitting: Physician Assistant

## 2018-07-22 ENCOUNTER — Encounter: Payer: Self-pay | Admitting: Physician Assistant

## 2018-07-22 VITALS — BP 115/78 | HR 71 | Ht 68.0 in | Wt 128.0 lb

## 2018-07-22 DIAGNOSIS — I7300 Raynaud's syndrome without gangrene: Secondary | ICD-10-CM | POA: Diagnosis not present

## 2018-07-22 DIAGNOSIS — F5101 Primary insomnia: Secondary | ICD-10-CM | POA: Insufficient documentation

## 2018-07-22 DIAGNOSIS — N390 Urinary tract infection, site not specified: Secondary | ICD-10-CM | POA: Diagnosis not present

## 2018-07-22 DIAGNOSIS — R350 Frequency of micturition: Secondary | ICD-10-CM | POA: Diagnosis not present

## 2018-07-22 LAB — POCT URINALYSIS DIPSTICK
Bilirubin, UA: NEGATIVE
Glucose, UA: NEGATIVE
Ketones, UA: NEGATIVE
PH UA: 6 (ref 5.0–8.0)
PROTEIN UA: POSITIVE — AB
Spec Grav, UA: 1.02 (ref 1.010–1.025)
UROBILINOGEN UA: 0.2 U/dL

## 2018-07-22 MED ORDER — NITROFURANTOIN MONOHYD MACRO 100 MG PO CAPS: 100.0000 mg | ORAL_CAPSULE | Freq: Two times a day (BID) | ORAL | 0 refills | Status: DC

## 2018-07-22 MED ORDER — ZOLPIDEM TARTRATE 5 MG PO TABS: 5.0000 mg | ORAL_TABLET | Freq: Every evening | ORAL | 1 refills | Status: DC | PRN

## 2018-07-22 NOTE — Progress Notes (Signed)
Subjective:    Patient ID: [name], female    DOB: 28-Jan-1956, 62 y.o.   MRN: [id number]  HPI Pt is a 62 yr old female w/ a hx of hypothyroidism, MDD, GAD presenting to the clinic complaining of trouble sleeping.   Insomnia:  Pt says this has been going on for 3 months due to stressful job and life circumstances. Pt was previously taking Belsomra for sleep but says it is causing her nightmares. She has tried OTC Nyquil without relief. She is unsure if she has tried anything else. She was prescribed trazodone by Dr. Gilmore Laroche but she does not remember taking it but says "it sounds familiar". If she did take it she did not feel like it worked. She reports sleeping 8 hours a night. She has trouble intiating sleep and reports waking up at night.    Urinary symptoms: Pt was seen in urgent care 3 weeks and was diagnosed with a UTI and was put on Keflex. She says since being on vacation she did not finish the Keflex prescription. This new symptoms started a week ago. Reports dysuria. Denies urinary frequency, hematuria, no pelvic or back pain, fever or chills.   Hand numbness: Left index finger has been going numb for 2 months. She also says the distal part of the finger turns white. This is unrelated to certain movement, stress or weather.   Review of Systems  Constitutional: Negative for chills, fatigue and fever.  HENT: Negative.   Eyes: Negative.   Respiratory: Negative.  Negative for shortness of breath and wheezing.   Cardiovascular: Negative for chest pain and palpitations.  Gastrointestinal: Negative.   Genitourinary: Positive for dysuria. Negative for difficulty urinating, flank pain, frequency, hematuria, pelvic pain and urgency.       Objective:   Physical Exam  Constitutional: She appears well-developed and well-nourished. No distress.  HENT:  Head: Normocephalic and atraumatic.  Eyes: Pupils are equal, round, and reactive to light. Conjunctivae and EOM are normal.  Neck: Normal  range of motion.  Cardiovascular: Normal rate, regular rhythm and normal heart sounds.  Pulmonary/Chest: Effort normal and breath sounds normal.  Abdominal: Soft. Bowel sounds are normal. There is no tenderness. There is no guarding and no CVA tenderness.  Musculoskeletal: Normal range of motion.  Neurological: She is alert.  Skin: Skin is warm and dry.      Assessment & Plan:  Marland KitchenMarland KitchenDiagnoses and all orders for this visit:  Acute urinary tract infection -     nitrofurantoin, macrocrystal-monohydrate, (MACROBID) 100 MG capsule; Take 1 capsule (100 mg total) by mouth 2 (two) times daily. For 7 days.  Urinary frequency -     POCT Urinalysis Dipstick  Raynaud's phenomenon without gangrene  Primary insomnia -     zolpidem (AMBIEN) 5 MG tablet; Take 1 tablet (5 mg total) by mouth at bedtime as needed for sleep.    Depression screen [facility] 2/9 07/22/2018 01/10/2018 09/06/2017  Decreased Interest 0 2 2  Down, Depressed, Hopeless 0 3 2  PHQ - 2 Score 0 5 4  Altered sleeping 3 0 3  Tired, decreased energy 0 3 1  Change in appetite 0 3 0  Feeling bad or failure about yourself  0 3 3  Trouble concentrating 0 1 0  Moving slowly or fidgety/restless 0 1 0  Suicidal thoughts 0 0 1  PHQ-9 Score 3 16 12   Difficult doing work/chores Not difficult at all Extremely dIfficult -  Some recent data might be hidden  GAD 7 : Generalized Anxiety Score 07/22/2018  Nervous, Anxious, on Edge 0  Control/stop worrying 0  Worry too much - different things 0  Trouble relaxing 0  Restless 0  Easily annoyed or irritable 0  Afraid - awful might happen 0  Total GAD 7 Score 0  Anxiety Difficulty Not difficult at all   Vitals:   07/22/18 1108  BP: 115/78  Pulse: 71  Weight: 128 lb (58.1 kg)  Height: 5\' 8"  (1.727 m)   -Insomnia: Pt has failed Trazodone (based on records) and Belsamra. Pt reports trouble sleeping and waking up at night. Will try low dose Ambien. Encouraged to closely follow up with PCP. Side  effects discussed. Pt aware it is controlled substance. Sleep hygiene discussed.   -Hand numbness: intermittent distal finger numbness and discoloration. Likely Raynauds. Pt told likely caused from cold weather, stress, caffeine use. Not a smoker. Pt told to follow up if worsens. HO on Raynauds given. Encouraged to avoid stressors.   -UTI: urine dipstick + nitrite and hematuria. Urine culture sent. No CVA tenderness. Normal vitals. Will try Macrobid. Pt has Sulfa allergy. Encourage to finish prescription. To follow up if symptoms worsen/persist or with new symtoms of fever, chills, back pain.   Marland Kitchen.Harlon Flor.I, Samyukta Cura PA-C, have reviewed and agree with the above documentation in it's entirety.

## 2018-07-22 NOTE — Progress Notes (Deleted)
  Subjective:     [name] is a 62 y.o. female here for a routine exam.  Current complaints: ***.  Personal health questionnaire reviewed: {yes/no:9010}.   Gynecologic History No LMP recorded. Patient is postmenopausal. Contraception: {method:5051} Last Pap: ***. Results were: {norm/abn:16337} Last mammogram: ***. Results were: {norm/abn:16337}  Obstetric History OB History  Gravida Para Term Preterm AB Living  0            SAB TAB Ectopic Multiple Live Births                {Common ambulatory SmartLinks:19316}  Review of Systems {ros; complete:30496}    Objective:    {exam; complete:18323}    Assessment:    Healthy female exam.    Plan:    {plan:19193}

## 2018-07-22 NOTE — Patient Instructions (Signed)
Raynaud Phenomenon Raynaud phenomenon is a condition that affects the blood vessels (arteries) that carry blood to your fingers and toes. The arteries that supply blood to your ears or the tip of your nose might also be affected. Raynaud phenomenon causes the arteries to temporarily narrow. As a result, the flow of blood to the affected areas is temporarily decreased. This usually occurs in response to cold temperatures or stress. During an attack, the skin in the affected areas turns white. You may also feel tingling or numbness in those areas. Attacks usually last for only a brief period, and then the blood flow to the area returns to normal. In most cases, Raynaud phenomenon does not cause serious health problems. What are the causes? For many people with this condition, the cause is not known. Raynaud phenomenon is sometimes associated with other diseases, such as scleroderma or lupus. What increases the risk? Raynaud phenomenon can affect anyone, but it develops most often in people who are 20-40 years old. It affects more females than males. What are the signs or symptoms? Symptoms of Raynaud phenomenon may occur when you are exposed to cold temperatures or when you have emotional stress. The symptoms may last for a few minutes or up to several hours. They usually affect your fingers but may also affect your toes, ears, or the tip of your nose. Symptoms may include:  Changes in skin color. The skin in the affected areas will turn pale or white. The skin may then change from white to bluish to red as normal blood flow returns to the area.  Numbness, tingling, or pain in the affected areas.  In severe cases, sores may develop in the affected areas. How is this diagnosed? Your health care provider will do a physical exam and take your medical history. You may be asked to put your hands in cold water to check for a reaction to cold temperature. Blood tests may be done to check for other diseases or  conditions. Your health care provider may also order a test to check the movement of blood through your arteries and veins (vascular ultrasound). How is this treated? Treatment often involves making lifestyle changes and taking steps to control your exposure to cold temperatures. For more severe cases, medicine (calcium channel blockers) may be used to improve blood flow. Surgery is sometimes done to block the nerves that control the affected arteries, but this is rare. Follow these instructions at home:  Avoid exposure to cold by taking these steps: ? If possible, stay indoors during cold weather. ? When you go outside during cold weather, dress in layers and wear mittens, a hat, a scarf, and warm footwear. ? Wear mittens or gloves when handling ice or frozen food. ? Use holders for glasses or cans containing cold drinks. ? Let warm water run for a while before taking a shower or bath. ? Warm up the car before driving in cold weather.  If possible, avoid stressful and emotional situations. Exercise, meditation, and yoga may help you cope with stress. Biofeedback may be useful.  Do not use any tobacco products, including cigarettes, chewing tobacco, or electronic cigarettes. If you need help quitting, ask your health care provider.  Avoid secondhand smoke.  Limit your use of caffeine. Switch to decaffeinated coffee, tea, and soda. Avoid chocolate.  Wear loose fitting socks and comfortable, roomy shoes.  Avoid vibrating tools and machinery.  Take medicines only as directed by your health care provider. Contact a health care provider if:    Your discomfort becomes worse despite lifestyle changes.  You develop sores on your fingers or toes that do not heal.  Your fingers or toes turn black.  You have breaks in the skin on your fingers or toes.  You have a fever.  You have pain or swelling in your joints.  You have a rash.  Your symptoms occur on only one side of your body. This  information is not intended to replace advice given to you by your health care provider. Make sure you discuss any questions you have with your health care provider. Document Released: 12/04/2000 Document Revised: 05/14/2016 Document Reviewed: 06/10/2016 Elsevier Interactive Patient Education  2017 Elsevier Inc.  

## 2018-07-28 ENCOUNTER — Telehealth: Payer: Self-pay

## 2018-07-28 DIAGNOSIS — R350 Frequency of micturition: Secondary | ICD-10-CM

## 2018-07-28 DIAGNOSIS — N390 Urinary tract infection, site not specified: Secondary | ICD-10-CM

## 2018-07-28 NOTE — Telephone Encounter (Signed)
Pt called- she is finishing up her antibiotic for her UTI and going out of town tomorrow. Pt just wanted advice on what she should do if she goes out of town and feels like her UTI returns.  Advised pt that if she finishes antibiotics and still has SX, she needs to be seen to re-test her urine. She expresses concerns because she only has one day left of antibiotics and she does not feel completely better yet.  Dr Linford ArnoldMetheney, would you be okay to put in an order for a repeat U/A and culture downstairs for pt prior to her going out of town?

## 2018-07-28 NOTE — Telephone Encounter (Signed)
Okay to place order for UA and culture and she can go downstairs before she leaves to have this done.  Will Call with with results

## 2018-07-29 NOTE — Telephone Encounter (Signed)
Spoke with pt- will come and have culture done prior to going out of town today

## 2018-07-29 NOTE — Telephone Encounter (Signed)
Order placed. Left VM for Pt with status update.

## 2018-08-22 ENCOUNTER — Other Ambulatory Visit: Payer: Self-pay | Admitting: Family Medicine

## 2018-08-23 ENCOUNTER — Other Ambulatory Visit (HOSPITAL_COMMUNITY): Payer: Self-pay

## 2018-08-23 MED ORDER — LAMOTRIGINE 25 MG PO TABS: 50.0000 mg | ORAL_TABLET | Freq: Every day | ORAL | 0 refills | Status: DC

## 2018-08-29 ENCOUNTER — Encounter (HOSPITAL_COMMUNITY): Payer: Self-pay | Admitting: Psychiatry

## 2018-08-29 ENCOUNTER — Other Ambulatory Visit: Payer: Self-pay

## 2018-08-29 ENCOUNTER — Ambulatory Visit (INDEPENDENT_AMBULATORY_CARE_PROVIDER_SITE_OTHER): Payer: No Typology Code available for payment source | Admitting: Psychiatry

## 2018-08-29 VITALS — BP 116/68 | Ht 68.0 in | Wt 130.0 lb

## 2018-08-29 DIAGNOSIS — F4320 Adjustment disorder, unspecified: Secondary | ICD-10-CM

## 2018-08-29 DIAGNOSIS — F411 Generalized anxiety disorder: Secondary | ICD-10-CM

## 2018-08-29 DIAGNOSIS — F419 Anxiety disorder, unspecified: Secondary | ICD-10-CM

## 2018-08-29 DIAGNOSIS — F0630 Mood disorder due to known physiological condition, unspecified: Secondary | ICD-10-CM | POA: Diagnosis not present

## 2018-08-29 DIAGNOSIS — Z813 Family history of other psychoactive substance abuse and dependence: Secondary | ICD-10-CM

## 2018-08-29 DIAGNOSIS — Z818 Family history of other mental and behavioral disorders: Secondary | ICD-10-CM

## 2018-08-29 DIAGNOSIS — F4321 Adjustment disorder with depressed mood: Secondary | ICD-10-CM

## 2018-08-29 MED ORDER — VORTIOXETINE HBR 20 MG PO TABS: 20.0000 mg | ORAL_TABLET | Freq: Every day | ORAL | 2 refills | Status: DC

## 2018-08-29 NOTE — Progress Notes (Signed)
Patient ID: [name], female   DOB: 10-12-1956, 62 y.o.   MRN: [id number]   [facility] Follow-up Outpatient Visit  [name] 05-23-1956  Date: 08/29/2018  Chief Complaint:  Follow Up. Depression and anxiety  History of Chief Complaint:   HPI Comments: [name] is a 62 y/o female with a past psychiatric history significant for Major Depression, Recurrent severe, generalized anxiety and bereavement. The patient returns  for psychiatric services and medication management.   Her depression dates back to 2008 when she lost her mom and also her brother was using drugs.anniverasries are tough around April  Doing fair on trintellix now at 20mg  Had sleep issues, primary care gave Remus Loffler doing fair  not observable tremors today. Not on abilify now but there was more longer duration of tremors even before abilify    No rash Job : [employer] Endorses worries but less intesne  Distant relationship with husband but sleeps together.  Husband retireing   Grief: not worse   . Modifying factors: exercise when she can  Review of Systems  Constitutional: Negative for fever.  Cardiovascular: Negative for chest pain and palpitations.  Gastrointestinal: Negative for nausea.  Skin: Negative for rash.  Psychiatric/Behavioral: Negative for depression.     Vitals:   08/29/18 1535  BP: 116/68  Weight: 130 lb (59 kg)  Height: 5\' 8"  (1.727 m)   Physical Exam  Vitals reviewed.  Constitutional: She appears well-developed and well-nourished. No distress.  Skin: She is not diaphoretic.     Past Medical History: Reviewed Past Medical History:  Diagnosis Date  . Anxiety   . Arthritis    Right Hip  . Breast disorder     Rt Breast Calcifications  . Chronic cystitis   . Depression   . Dyspareunia   . Hypercholesterolemia   . Hypothyroidism   . Plantar fasciitis of left foot    2013  . Torn meniscus 11/13/15   Rt knee   Allergies: Reviewed Allergies  Allergen  Reactions  . Bupropion     Other reaction(s): Other Suicidal thoughts  . Sulfa Antibiotics Itching and Hives  . Belsomra [Suvorexant] Other (See Comments)    Nightmares   Current Medications: Reviewed Current Outpatient Medications on File Prior to Visit  Medication Sig Dispense Refill  . clonazePAM (KLONOPIN) 0.5 MG tablet TAKE ONE TAB BY MOUTH DAILY AS NEEDED 90 tablet 0  . HYDROcodone-acetaminophen (NORCO) 10-325 MG tablet TAKE 1 TABLET BY MOUTH 6 TIMES DAILY  0  . ibuprofen (ADVIL,MOTRIN) 800 MG tablet Take 1 tablet (800 mg total) by mouth every 8 (eight) hours as needed. 60 tablet 2  . lamoTRIgine (LAMICTAL) 25 MG tablet Take 2 tablets (50 mg total) by mouth daily. 60 tablet 0  . levothyroxine (SYNTHROID, LEVOTHROID) 88 MCG tablet Take 1 tablet (88 mcg total) by mouth daily before breakfast. Last refill.30 day supply given must make and keep appointment for refills 30 tablet 0  . meloxicam (MOBIC) 7.5 MG tablet TAKE ONE TABLET (7.5 MG DOSE) BY MOUTH DAILY.  2  . nitrofurantoin, macrocrystal-monohydrate, (MACROBID) 100 MG capsule Take 1 capsule (100 mg total) by mouth 2 (two) times daily. For 7 days. 14 capsule 0  . Probiotic Product (PROBIOTIC-10 PO) Take by mouth.    . traMADol (ULTRAM) 50 MG tablet Take 50 mg by mouth 2 (two) times daily.  0  . traMADol (ULTRAM-ER) 300 MG 24 hr tablet Take 300 mg by mouth daily.  0  . valACYclovir (VALTREX) 500 MG  tablet TAKE 1 TABLET (500 MG TOTAL) BY MOUTH DAILY AS NEEDED. 90 tablet 1  . zolpidem (AMBIEN) 5 MG tablet Take 1 tablet (5 mg total) by mouth at bedtime as needed for sleep. 30 tablet 1  . [DISCONTINUED] ARIPiprazole (ABILIFY) 5 MG tablet Take 2 tablets (10 mg total) by mouth daily. 180 tablet 0  . [DISCONTINUED] FLUoxetine (PROZAC) 20 MG tablet Take 20 mg by mouth daily.    . [DISCONTINUED] traZODone (DESYREL) 50 MG tablet Take 0.5-1 tablets (25-50 mg total) by mouth at bedtime as needed for sleep. 90 tablet 1   No current  facility-administered medications on file prior to visit.      Substance Abuse History in the last 12 months: Reviewed Social History   Tobacco Use  . Smoking status: Former Smoker    Last attempt to quit: 09/20/1982    Years since quitting: 35.9  . Smokeless tobacco: Never Used  Substance Use Topics  . Alcohol use: Yes    Alcohol/week: 0.0 standard drinks    Comment: Use is 1-2 a month.  Caffeine: 2 cups a day   Family History: Reviewed Family History   Problem  Relation  Age of Onset   .  COPD     .  Aneurysm     .  Anxiety disorder  Mother    .  Depression  Mother    .  Alcohol abuse  Father    .  Heart murmur  Father    .  Drug abuse  Brother    .  Anxiety disorder  Brother    .  Depression  Brother    .  Dementia  Maternal Grandfather     Psychiatric Specialty Exam:  Objective: Appearance: Casual and Well Groomed   Eye Contact:: Good   Speech: Clear and Coherent and Normal Rate   Volume: Normal   Mood: fair   Affect:  improved  Thought Process: Coherent, Logical   Orientation: Full   Thought Content: WDL   Suicidal Thoughts: No   Homicidal Thoughts: No   Judgement: Fair   Insight: Fair   Psychomotor Activity: Normal   Akathisia: No   Handed: Left   Memory: Immediate 3/3, recent: 1/3   . Assets: Communication Skills  Desire for Improvement  Financial Resources/Insurance  Housing  Leisure Time  Physical Health  Resilience  Transportation  Vocational/Educational    Laboratory/X-Ray  Psychological Evaluation(s)   NONE  NONE    Assessment:  AXIS I  Major Depression, Recurrent -. Generalized anxiety disorder. bereavement  AXIS II  No diagnosis   AXIS III  Past Medical History    Diagnosis  Date    .  Hypercholesterolemia     .  Arthritis       Right Hip    .  Hypothyroidism     .  Chronic cystitis     .  Decreased libido     .  Dyspareunia     .  Breast disorder       Rt Breast Calcifications    .  Depression     .  Anxiety     .  Plantar  fasciitis of left foot       2013      AXIS IV  other psychosocial or environmental problems   AXIS V  64   Treatment Plan/Recommendations:   Plan of Care:  PLAN:  1. Affirm with the patient that the medications are taken as ordered. Patient  expressed understanding of how their medications were to be used.    Laboratory:    Psychotherapy: Therapy: brief supportive therapy provided.  Discussed psychosocial stressors in detail. More than 50% of the visit was spent on individual therapy/counseling.   Medications:  Continue  the following psychiatric medications as written prior to this appointment with the following changes:: continue following  1. Depression: doing fair. Continue trintellix  Continue lamictal 2. Anxiety disorder; not worse. Continue low dose klonopine Continue low dose klonopine 3. Grief. Not worse. Gets worse around April Likes her job Encouraged activitites and ME time goals Reviewed side effects and concerns were addressed  [id number]             Thresa Ross, MD

## 2018-09-20 ENCOUNTER — Other Ambulatory Visit: Payer: Self-pay | Admitting: Family Medicine

## 2018-09-27 ENCOUNTER — Other Ambulatory Visit: Payer: Self-pay | Admitting: Physician Assistant

## 2018-09-27 DIAGNOSIS — F5101 Primary insomnia: Secondary | ICD-10-CM

## 2018-09-27 NOTE — Telephone Encounter (Signed)
Routing to pcp for review and signature.Deberah Adolf [name], CMA  

## 2018-10-05 ENCOUNTER — Other Ambulatory Visit: Payer: Self-pay | Admitting: *Deleted

## 2018-10-05 ENCOUNTER — Other Ambulatory Visit: Payer: Self-pay | Admitting: Physician Assistant

## 2018-10-05 DIAGNOSIS — N390 Urinary tract infection, site not specified: Secondary | ICD-10-CM

## 2018-10-07 ENCOUNTER — Encounter: Payer: Self-pay | Admitting: Physician Assistant

## 2018-10-07 ENCOUNTER — Ambulatory Visit: Payer: No Typology Code available for payment source | Admitting: Physician Assistant

## 2018-10-07 VITALS — BP 116/73 | HR 54 | Temp 98.4°F | Wt 132.0 lb

## 2018-10-07 DIAGNOSIS — R300 Dysuria: Secondary | ICD-10-CM

## 2018-10-07 DIAGNOSIS — N3001 Acute cystitis with hematuria: Secondary | ICD-10-CM | POA: Diagnosis not present

## 2018-10-07 LAB — POCT URINALYSIS DIPSTICK
BILIRUBIN UA: NEGATIVE
GLUCOSE UA: NEGATIVE
KETONES UA: NEGATIVE
Nitrite, UA: POSITIVE
Protein, UA: NEGATIVE
Spec Grav, UA: 1.015 (ref 1.010–1.025)
UROBILINOGEN UA: 0.2 U/dL
pH, UA: 5.5 (ref 5.0–8.0)

## 2018-10-07 MED ORDER — CEFUROXIME AXETIL 250 MG PO TABS: 250.0000 mg | ORAL_TABLET | Freq: Two times a day (BID) | ORAL | 0 refills | Status: AC

## 2018-10-07 NOTE — Progress Notes (Signed)
HPI:                                                                [name] is a 62 y.o. female who presents to [facility] today for dysuria  Dysuria   This is a new problem. The current episode started in the past 7 days. The problem occurs intermittently. The problem has been unchanged. The quality of the pain is described as burning. The pain is mild. There has been no fever. Associated symptoms include frequency and urgency. Pertinent negatives include no chills, flank pain or nausea. She has tried increased fluids for the symptoms. Her past medical history is significant for recurrent UTIs (06/2018, 07/2018).    Past Medical History:  Diagnosis Date  . Anxiety   . Arthritis    Right Hip  . Breast disorder     Rt Breast Calcifications  . Chronic cystitis   . Depression   . Dyspareunia   . Hypercholesterolemia   . Hypothyroidism   . Plantar fasciitis of left foot    2013  . Torn meniscus 11/13/15   Rt knee   Past Surgical History:  Procedure Laterality Date  . AUGMENTATION MAMMAPLASTY    . BLEPHAROPLASTY    . BREAST ENHANCEMENT SURGERY    . KNEE SURGERY Right 11/16  . Scoliosis repair with Harrington Rods     Social History   Tobacco Use  . Smoking status: Former Smoker    Last attempt to quit: 09/20/1982    Years since quitting: 36.0  . Smokeless tobacco: Never Used  Substance Use Topics  . Alcohol use: Yes    Alcohol/week: 0.0 standard drinks    Comment: Use is 1-2 a month.   family history includes Alcohol abuse in her father; Aneurysm in her unknown relative; Anxiety disorder in her brother and mother; COPD in her father; Dementia in her maternal grandfather; Depression in her brother and mother; Drug abuse in her brother; Heart murmur in her father.    ROS: negative except as noted in the HPI  Medications: Current Outpatient Medications  Medication Sig Dispense Refill  . cefUROXime (CEFTIN) 250 MG  tablet Take 1 tablet (250 mg total) by mouth 2 (two) times daily with a meal for 7 days. 14 tablet 0  . clonazePAM (KLONOPIN) 0.5 MG tablet TAKE ONE TAB BY MOUTH DAILY AS NEEDED 90 tablet 0  . HYDROcodone-acetaminophen (NORCO) 10-325 MG tablet TAKE 1 TABLET BY MOUTH 6 TIMES DAILY  0  . ibuprofen (ADVIL,MOTRIN) 800 MG tablet Take 1 tablet (800 mg total) by mouth every 8 (eight) hours as needed. 60 tablet 2  . lamoTRIgine (LAMICTAL) 25 MG tablet Take 2 tablets (50 mg total) by mouth daily. 60 tablet 0  . levothyroxine (SYNTHROID, LEVOTHROID) 88 MCG tablet TAKE 1 TABLET (88 MCG TOTAL) BY MOUTH DAILY BEFORE BREAKFAST. 30 tablet 3  . meloxicam (MOBIC) 7.5 MG tablet TAKE ONE TABLET (7.5 MG DOSE) BY MOUTH DAILY.  2  . Probiotic Product (PROBIOTIC-10 PO) Take by mouth.    . traMADol (ULTRAM) 50 MG tablet Take 50 mg by mouth 2 (two) times daily.  0  . traMADol (ULTRAM-ER) 300 MG 24 hr tablet Take 300 mg by mouth daily.  0  .  valACYclovir (VALTREX) 500 MG tablet TAKE 1 TABLET (500 MG TOTAL) BY MOUTH DAILY AS NEEDED. 90 tablet 1  . vortioxetine HBr (TRINTELLIX) 20 MG TABS tablet Take 1 tablet (20 mg total) by mouth daily. 30 tablet 2  . zolpidem (AMBIEN) 5 MG tablet TAKE 1 TABLET (5 MG TOTAL) BY MOUTH AT BEDTIME AS NEEDED FOR SLEEP. 30 tablet 1   No current facility-administered medications for this visit.    Allergies  Allergen Reactions  . Bupropion     Other reaction(s): Other Suicidal thoughts  . Sulfa Antibiotics Itching and Hives  . Belsomra [Suvorexant] Other (See Comments)    Nightmares  . Ciprofloxacin Other (See Comments)    GI upset       Objective:  BP 116/73   Pulse (!) 54   Temp 98.4 F (36.9 C) (Oral)   Wt 132 lb (59.9 kg)   BMI 20.07 kg/m  Gen:  alert, not ill-appearing, no distress, appropriate for age HEENT: head normocephalic without obvious abnormality, conjunctiva and cornea clear, trachea midline Pulm: Normal work of breathing, normal phonation GI: abdomen soft,  nontender, no guarding or rigidity, no cva tenderness Neuro: alert and oriented x 3, no tremor MSK: extremities atraumatic, normal gait and station Skin: intact, no rashes on exposed skin, no jaundice, no cyanosis Psych: well-groomed, cooperative, good eye contact, euthymic mood, affect mood-congruent, speech is articulate, and thought processes clear and goal-directed    Results for orders placed or performed in visit on 10/07/18 (from the past 72 hour(s))  POCT Urinalysis Dipstick     Status: Abnormal   Collection Time: 10/07/18  1:34 PM  Result Value Ref Range   Color, UA dark yellow    Clarity, UA cloudy    Glucose, UA Negative Negative   Bilirubin, UA negative    Ketones, UA negative    Spec Grav, UA 1.015 1.010 - 1.025   Blood, UA moderate    pH, UA 5.5 5.0 - 8.0   Protein, UA Negative Negative   Urobilinogen, UA 0.2 0.2 or 1.0 E.U./dL   Nitrite, UA positive    Leukocytes, UA Large (3+) (A) Negative   Appearance     Odor     No results found.    Assessment and Plan: 62 y.o. female with   .Diagnoses and all orders for this visit:  Acute cystitis with hematuria -     cefUROXime (CEFTIN) 250 MG tablet; Take 1 tablet (250 mg total) by mouth 2 (two) times daily with a meal for 7 days.  Dysuria -     POCT Urinalysis Dipstick -     Urine Culture  Afebrile, no CVA tenderness UA grossly positive for moderate blood, nitrates, and leuks Urine culture pending Treating empirically for uncomplicated cystitis with ceftin    Patient education and anticipatory guidance given Patient agrees with treatment plan Follow-up as needed if symptoms worsen or fail to improve  Levonne Hubert PA-C

## 2018-10-07 NOTE — Patient Instructions (Signed)

## 2018-10-09 LAB — URINE CULTURE
MICRO NUMBER:: 91255740
SPECIMEN QUALITY: ADEQUATE

## 2018-10-17 ENCOUNTER — Telehealth: Payer: Self-pay

## 2018-10-17 DIAGNOSIS — R300 Dysuria: Secondary | ICD-10-CM

## 2018-10-17 DIAGNOSIS — R350 Frequency of micturition: Secondary | ICD-10-CM

## 2018-10-17 DIAGNOSIS — N390 Urinary tract infection, site not specified: Secondary | ICD-10-CM

## 2018-10-17 NOTE — Telephone Encounter (Signed)
Needs repeat UA and culture and if pos then we can treat.  OK to sent to lab.

## 2018-10-17 NOTE — Telephone Encounter (Signed)
Pt was seen on 10/06/18 for UTI. Culture was done and she was given antibiotic.   Pt now states she has finished medication but symptoms are not completely gone. She has upcoming pending procedure and UTI has to be completed cleared.   Can you have additional antibiotics or does another UA need to be done? Please advise

## 2018-10-18 NOTE — Telephone Encounter (Signed)
Pt advised. Orders for urinalysis and culture sent to lab, pt will go to lab today and give sample.

## 2018-10-21 MED ORDER — AMOXICILLIN-POT CLAVULANATE 500-125 MG PO TABS: 1.0000 | ORAL_TABLET | Freq: Two times a day (BID) | ORAL | 0 refills | Status: DC

## 2018-10-21 NOTE — Telephone Encounter (Signed)
Patient is going out of town later today. She wanted to know if she should be on an antibiotic for UTI. I advised culture has not returned.

## 2018-10-21 NOTE — Telephone Encounter (Signed)
Prescription sent to [facility] for Augmentin.  Culture still pending

## 2018-10-21 NOTE — Addendum Note (Signed)
Addended by: Nani Gasser D on: 10/21/2018 05:04 PM   Modules accepted: Orders

## 2018-10-23 LAB — URINALYSIS
Bilirubin Urine: NEGATIVE
Glucose, UA: NEGATIVE
KETONES UR: NEGATIVE
NITRITE: NEGATIVE
PROTEIN: NEGATIVE
SPECIFIC GRAVITY, URINE: 1.015 (ref 1.001–1.03)
pH: 5.5 (ref 5.0–8.0)

## 2018-10-23 LAB — URINE CULTURE
MICRO NUMBER:: 91312759
SPECIMEN QUALITY:: ADEQUATE

## 2018-10-24 ENCOUNTER — Telehealth: Payer: Self-pay

## 2018-10-24 NOTE — Telephone Encounter (Signed)
[name] called and states her and her husband are traveling to [place]. She wanted to know if she needs to get any vaccine before the trip. Please advise.

## 2018-10-25 ENCOUNTER — Other Ambulatory Visit: Payer: Self-pay | Admitting: Family Medicine

## 2018-10-25 MED ORDER — CIPROFLOXACIN HCL 500 MG PO TABS: 500.0000 mg | ORAL_TABLET | Freq: Two times a day (BID) | ORAL | 0 refills | Status: AC

## 2018-10-27 ENCOUNTER — Telehealth: Payer: Self-pay

## 2018-10-27 MED ORDER — PROMETHAZINE HCL 25 MG PO TABS: 25.0000 mg | ORAL_TABLET | Freq: Three times a day (TID) | ORAL | 0 refills | Status: DC | PRN

## 2018-10-27 NOTE — Telephone Encounter (Signed)
Prescription for Phenergan sent to pharmacy

## 2018-10-27 NOTE — Telephone Encounter (Signed)
She needs to make sure she is up to date on regular childhood vaccine. In addition she will need HepA/B ( twinrix), up to date measles vaccine if onely had one vaccine as a child.  May need typhoid depending on type of activity while there.   Recommend Deet bug spray to be work while there.  As there is possible exposure to dengue and zika.   We can put her on my schedule if wants vaccines, etc.

## 2018-10-27 NOTE — Telephone Encounter (Signed)
Sabreena left a message stating she only needs the anti-nausea medication. I saw in the results phenergan would be sent. Please advise.

## 2018-11-01 NOTE — Telephone Encounter (Signed)
Left a message for a return call.

## 2018-11-07 ENCOUNTER — Other Ambulatory Visit: Payer: Self-pay | Admitting: Family Medicine

## 2018-11-07 NOTE — Telephone Encounter (Signed)
Pt will need an OV for refills., routing to pcp for signature for 30 day supply.Loralee PacasBarkley, [name], [place] '

## 2018-11-15 ENCOUNTER — Other Ambulatory Visit: Payer: Self-pay | Admitting: Family Medicine

## 2018-11-15 NOTE — Telephone Encounter (Signed)
Routing to Dr. Benjamin Stainhekkekandam for review and signature since he was the last to prescribe this medication for her.[name].[name], [name], CMA

## 2018-11-22 ENCOUNTER — Other Ambulatory Visit: Payer: Self-pay | Admitting: Family Medicine

## 2018-11-25 ENCOUNTER — Other Ambulatory Visit: Payer: Self-pay | Admitting: Sports Medicine

## 2018-11-25 DIAGNOSIS — M1711 Unilateral primary osteoarthritis, right knee: Secondary | ICD-10-CM

## 2018-11-28 ENCOUNTER — Telehealth: Payer: Self-pay | Admitting: Family Medicine

## 2018-11-28 ENCOUNTER — Ambulatory Visit (INDEPENDENT_AMBULATORY_CARE_PROVIDER_SITE_OTHER): Payer: No Typology Code available for payment source | Admitting: Psychiatry

## 2018-11-28 ENCOUNTER — Other Ambulatory Visit: Payer: Self-pay

## 2018-11-28 ENCOUNTER — Encounter (HOSPITAL_COMMUNITY): Payer: Self-pay | Admitting: Psychiatry

## 2018-11-28 VITALS — BP 102/70 | Ht 68.0 in | Wt 133.0 lb

## 2018-11-28 DIAGNOSIS — F411 Generalized anxiety disorder: Secondary | ICD-10-CM

## 2018-11-28 DIAGNOSIS — F0630 Mood disorder due to known physiological condition, unspecified: Secondary | ICD-10-CM | POA: Diagnosis not present

## 2018-11-28 DIAGNOSIS — F419 Anxiety disorder, unspecified: Secondary | ICD-10-CM

## 2018-11-28 DIAGNOSIS — F4321 Adjustment disorder with depressed mood: Secondary | ICD-10-CM

## 2018-11-28 MED ORDER — LAMOTRIGINE 25 MG PO TABS: 50.0000 mg | ORAL_TABLET | Freq: Every day | ORAL | 2 refills | Status: DC

## 2018-11-28 MED ORDER — VORTIOXETINE HBR 20 MG PO TABS: 20.0000 mg | ORAL_TABLET | Freq: Every day | ORAL | 2 refills | Status: DC

## 2018-11-28 NOTE — Progress Notes (Signed)
Patient ID: [name], female   DOB: 03-08-1956, 62 y.o.   MRN: [id number]   [facility] Follow-up Outpatient Visit  [name] [birth year]  Date: 11/28/2018  Chief Complaint:  Follow Up. Depression and anxiety  History of Chief Complaint:   HPI Comments: [name] is a 62 y/o female with a past psychiatric history significant for Major Depression, Recurrent severe, generalized anxiety and bereavement. The patient returns  for psychiatric services and medication management.   Her depression dates back to 2008 when she lost her mom and also her brother was using drugs  [place]. Somewhat stressed about it but looking forward Going [employer] as retirement gift  Doing fair on trintellix now at 20mg  No tremors  lamictal has helped No rash Job : [employer]   Husband retireing has had distant relationship but planning together now vacations   Grief: not worse   . Modifying factors: exercise when she can  Review of Systems  Constitutional: Negative for fever.  Cardiovascular: Negative for chest pain and palpitations.  Gastrointestinal: Negative for nausea.  Skin: Negative for rash.  Psychiatric/Behavioral: Negative for depression.     Vitals:   11/28/18 1448  BP: 102/70  Weight: 133 lb (60.3 kg)  Height: 5\' 8"  (1.727 m)   Physical Exam  Vitals reviewed.  Constitutional: She appears well-developed and well-nourished. No distress.  Skin: She is not diaphoretic.     Past Medical History: Reviewed Past Medical History:  Diagnosis Date  . Anxiety   . Arthritis    Right Hip  . Breast disorder     Rt Breast Calcifications  . Chronic cystitis   . Depression   . Dyspareunia   . Hypercholesterolemia   . Hypothyroidism   . Plantar fasciitis of left foot    2013  . Torn meniscus 11/13/15   Rt knee   Allergies: Reviewed Allergies  Allergen Reactions  . Bupropion     Other reaction(s): Other Suicidal  thoughts  . Sulfa Antibiotics Itching and Hives  . Belsomra [Suvorexant] Other (See Comments)    Nightmares  . Ciprofloxacin Other (See Comments)    GI upset   Current Medications: Reviewed Current Outpatient Medications on File Prior to Visit  Medication Sig Dispense Refill  . clonazePAM (KLONOPIN) 0.5 MG tablet Take 1 tablet (0.5 mg total) by mouth daily as needed for anxiety. 30 DAY SUPPLY GIVEN.APPOINTMENT REQUIRED FOR FUTURE REFILLS. 30 tablet 0  . diclofenac (VOLTAREN) 75 MG EC tablet TAKE 1 TABLET (75 MG TOTAL) 2 (TWO) TIMES DAILY BY MOUTH. 180 tablet 1  . HYDROcodone-acetaminophen (NORCO) 10-325 MG tablet TAKE 1 TABLET BY MOUTH 6 TIMES DAILY  0  . ibuprofen (ADVIL,MOTRIN) 800 MG tablet Take 1 tablet (800 mg total) by mouth every 8 (eight) hours as needed. 60 tablet 2  . levothyroxine (SYNTHROID, LEVOTHROID) 88 MCG tablet TAKE 1 TABLET (88 MCG TOTAL) BY MOUTH DAILY BEFORE BREAKFAST. 30 tablet 3  . meloxicam (MOBIC) 7.5 MG tablet TAKE ONE TABLET (7.5 MG DOSE) BY MOUTH DAILY. 30 tablet 2  . Probiotic Product (PROBIOTIC-10 PO) Take by mouth.    . valACYclovir (VALTREX) 500 MG tablet TAKE 1 TABLET (500 MG TOTAL) BY MOUTH DAILY AS NEEDED. 90 tablet 1  . zolpidem (AMBIEN) 5 MG tablet TAKE 1 TABLET (5 MG TOTAL) BY MOUTH AT BEDTIME AS NEEDED FOR SLEEP. 30 tablet 1  . promethazine (PHENERGAN) 25 MG tablet Take 1 tablet (25 mg total) by mouth every  8 (eight) hours as needed for nausea or vomiting. (Patient not taking: Reported on 11/28/2018) 12 tablet 0  . traMADol (ULTRAM) 50 MG tablet Take 50 mg by mouth 2 (two) times daily.  0  . traMADol (ULTRAM-ER) 300 MG 24 hr tablet Take 300 mg by mouth daily.  0  . [DISCONTINUED] ARIPiprazole (ABILIFY) 5 MG tablet Take 2 tablets (10 mg total) by mouth daily. 180 tablet 0  . [DISCONTINUED] FLUoxetine (PROZAC) 20 MG tablet Take 20 mg by mouth daily.    . [DISCONTINUED] traZODone (DESYREL) 50 MG tablet Take 0.5-1 tablets (25-50 mg total) by mouth at bedtime  as needed for sleep. 90 tablet 1   No current facility-administered medications on file prior to visit.      Substance Abuse History in the last 12 months: Reviewed Social History   Tobacco Use  . Smoking status: Former Smoker    Last attempt to quit: 09/20/1982    Years since quitting: 36.2  . Smokeless tobacco: Never Used  Substance Use Topics  . Alcohol use: Yes    Alcohol/week: 0.0 standard drinks    Comment: Use is 1-2 a month.  Caffeine: 2 cups a day   Family History: Reviewed Family History   Problem  Relation  Age of Onset   .  COPD     .  Aneurysm     .  Anxiety disorder  Mother    .  Depression  Mother    .  Alcohol abuse  Father    .  Heart murmur  Father    .  Drug abuse  Brother    .  Anxiety disorder  Brother    .  Depression  Brother    .  Dementia  Maternal Grandfather     Psychiatric Specialty Exam:  Objective: Appearance: Casual and Well Groomed   Eye Contact:: Good   Speech: Clear and Coherent and Normal Rate   Volume: Normal   Mood: fair   Affect:  improved  Thought Process: Coherent, Logical   Orientation: Full   Thought Content: WDL   Suicidal Thoughts: No   Homicidal Thoughts: No   Judgement: Fair   Insight: Fair   Psychomotor Activity: Normal   Akathisia: No   Handed: Left   Memory: Immediate 3/3, recent: 1/3   . Assets: Communication Skills  Desire for Improvement  Financial Resources/Insurance  Housing  Leisure Time  Physical Health  Resilience  Transportation  Vocational/Educational    Laboratory/X-Ray  Psychological Evaluation(s)   NONE  NONE    Assessment:  AXIS I  Major Depression, Recurrent -. Generalized anxiety disorder. bereavement  AXIS II  No diagnosis   AXIS III                                                                            AXIS IV    AXIS V     Treatment Plan/Recommendations:   Plan of Care:  PLAN:  1. Affirm with the patient that the medications are taken as ordered. Patient   expressed understanding of how their medications were to be used.    Laboratory:    Psychotherapy: Therapy: brief supportive therapy provided.  Discussed psychosocial stressors in  detail. More than 50% of the visit was spent on individual therapy/counseling.   Medications:   1. Depression:doing fair. Continue trintellix Continue lamictal 2. Anxiety disorder; not worse. Takes prn klonoopine from primary care  3. Grief. Not worse. Gets worse around April Planning retirement. Reviewed meds FU 2m. Can get more refills once moves and if need to             Thresa Ross, MD

## 2018-11-28 NOTE — Telephone Encounter (Signed)
Pt is traveling to [place] soon, where she will be staying at a resort on an [place] in the [place]. Questions what vaccines (or titers) she needs before leaving.     Per CDC guidelines: All travelers You should be up to date on routine vaccinations while traveling to any destination. These vaccines include measles-mumps-rubella (MMR) vaccine, diphtheria-tetanus-pertussis vaccine, varicella (chickenpox) vaccine, polio vaccine, and your yearly flu shot.  Measles  Infants (6 through 65 months old): 1 dose of measles-mumps-rubella (MMR) vaccine before travel. This dose does not count as the first dose in the routine childhood vaccination series. People 61 months old or older, with no evidence of immunity or no written documentation of any doses: 2 doses of MMR vaccine before travel. The 2 doses must be given 28 days apart. People 15 months old or older who have written documentation of 1 dose and no other evidence of immunity: 1 additional dose before travel, at least 28 days after the previous dose.  Most travelers Get travel vaccines and medicines because there is a risk of these diseases in the country you are visiting.  Hepatitis A  CDC recommends this vaccine because you can get hepatitis A through contaminated food or water in [place], regardless of where you are eating or staying.  Typhoid  You can get typhoid through contaminated food or water in [place]. CDC recommends this vaccine for most travelers, especially if you are staying with friends or relatives, visiting smaller cities or rural areas, or if you are an adventurous eater.

## 2018-11-29 NOTE — Telephone Encounter (Signed)
OK, lets have her make appt for travel advice with me.  We can get her updated with Hep A and I can rx typoid and discuss travel antibiotic etc.  WE can order MMR titers as well. If she can get titers done before hand that would help.

## 2018-11-30 NOTE — Telephone Encounter (Signed)
Left a message for patient to call back to schedule and advised to have titers drawn first.

## 2018-12-04 ENCOUNTER — Other Ambulatory Visit: Payer: Self-pay | Admitting: Family Medicine

## 2018-12-06 ENCOUNTER — Other Ambulatory Visit: Payer: Self-pay

## 2018-12-06 MED ORDER — CLONAZEPAM 0.5 MG PO TABS: 0.5000 mg | ORAL_TABLET | Freq: Every day | ORAL | 0 refills | Status: DC | PRN

## 2018-12-06 NOTE — Telephone Encounter (Signed)
[name] requests a refill on Clonazepam. She has a follow up on 12/12/2018.

## 2018-12-07 ENCOUNTER — Telehealth: Payer: Self-pay

## 2018-12-07 NOTE — Telephone Encounter (Signed)
Ok to fill early but she really needs to use sparingly this is really a rescue medicine and is not meant to be a daily controller medication.  I am always happy to see her and discuss putting her on a controller so that she is not having to use her medication quite so frequently.  But okay to fill early this time.

## 2018-12-07 NOTE — Telephone Encounter (Signed)
[name] called and states the pharmacy will not fill her prescription until the 23rd for the clonazepam. I advised the clonazepam was written for tablet daily as needed and that it was 30 tablets for 30 days. She said she didn't know she could not take it more than once daily. She states she has been under a lot of stress with packing and organizing a retirement party for her husband. She wanted to know if Dr Linford ArnoldMetheney would ok an early refill.

## 2018-12-08 NOTE — Telephone Encounter (Signed)
Pharmacy advised  

## 2018-12-08 NOTE — Telephone Encounter (Signed)
Left VM with update for Pt. Advised this Rx must last 30 days.  Will call pharmacy to advise when they open at 9

## 2018-12-09 ENCOUNTER — Other Ambulatory Visit: Payer: Self-pay | Admitting: Family Medicine

## 2018-12-09 ENCOUNTER — Other Ambulatory Visit: Payer: Self-pay | Admitting: *Deleted

## 2018-12-09 DIAGNOSIS — Z1231 Encounter for screening mammogram for malignant neoplasm of breast: Secondary | ICD-10-CM

## 2018-12-09 DIAGNOSIS — Z0184 Encounter for antibody response examination: Secondary | ICD-10-CM

## 2018-12-12 ENCOUNTER — Ambulatory Visit: Payer: Self-pay | Admitting: Family Medicine

## 2018-12-12 LAB — VARICELLA ZOSTER ANTIBODY, IGG: Varicella IgG: 1239 index

## 2018-12-12 LAB — MEASLES/MUMPS/RUBELLA IMMUNITY
Mumps IgG: >300 AU/mL
Rubella: 21.5 index
Rubeola IgG: >300 AU/mL

## 2018-12-20 ENCOUNTER — Other Ambulatory Visit: Payer: Self-pay | Admitting: Family Medicine

## 2018-12-20 DIAGNOSIS — F5101 Primary insomnia: Secondary | ICD-10-CM

## 2018-12-22 NOTE — Telephone Encounter (Deleted)
Routing to pcp for signature, pt has an appt on 12/30/18 .Heath Gold, CMA

## 2018-12-24 ENCOUNTER — Emergency Department (INDEPENDENT_AMBULATORY_CARE_PROVIDER_SITE_OTHER): Payer: No Typology Code available for payment source

## 2018-12-24 ENCOUNTER — Encounter: Payer: Self-pay | Admitting: Emergency Medicine

## 2018-12-24 ENCOUNTER — Other Ambulatory Visit: Payer: Self-pay

## 2018-12-24 ENCOUNTER — Emergency Department
Admission: EM | Admit: 2018-12-24 | Discharge: 2018-12-24 | Disposition: A | Discharge status: Home / Self Care | Payer: No Typology Code available for payment source | Source: Home / Self Care | Attending: Family Medicine | Admitting: Family Medicine

## 2018-12-24 DIAGNOSIS — M25551 Pain in right hip: Secondary | ICD-10-CM | POA: Diagnosis not present

## 2018-12-24 DIAGNOSIS — G8929 Other chronic pain: Secondary | ICD-10-CM | POA: Diagnosis not present

## 2018-12-24 DIAGNOSIS — M545 Low back pain: Secondary | ICD-10-CM

## 2018-12-24 DIAGNOSIS — M47896 Other spondylosis, lumbar region: Secondary | ICD-10-CM | POA: Diagnosis not present

## 2018-12-24 DIAGNOSIS — M8580 Other specified disorders of bone density and structure, unspecified site: Secondary | ICD-10-CM

## 2018-12-24 DIAGNOSIS — M5441 Lumbago with sciatica, right side: Secondary | ICD-10-CM

## 2018-12-24 MED ORDER — PREDNISONE 20 MG PO TABS: ORAL_TABLET | ORAL | 0 refills | Status: DC

## 2018-12-24 MED ORDER — KETOROLAC TROMETHAMINE 60 MG/2ML IM SOLN
60.0000 mg | Freq: Once | INTRAMUSCULAR | Status: AC
  Administered 2018-12-24: 60 mg via INTRAMUSCULAR

## 2018-12-24 NOTE — Discharge Instructions (Addendum)
Apply ice pack for 20 to 30 minutes, 3 to 4 times daily  Continue until pain and swelling decrease.  °

## 2018-12-24 NOTE — ED Provider Notes (Signed)
[facility]    CSN: [id number] Arrival date & time: 12/24/18  0947     History   Chief Complaint Chief Complaint  Patient presents with  . Hip Pain    right    HPI [name] is a 63 y.o. female.   Patient has a history of chronic low back pain, followed [facility]jam [facility] [place]  She has lumbar spondylosis, degenerative disc disease, and past history of Harrington rod placement in her thoracic spine.  She is apparently scheduled for a radiofrequency ablation procedure on 01/06/19.  About 3 weeks ago she began developing right lower back and hip pain that she has not had before.  The pain became acutely worse today.  The pain does not radiate.   She denies bowel or bladder dysfunction, and no saddle numbness.  She reports that her pain is improved somewhat for about 4 hours with morhine sulfate.  She admits that she does not take calcium or vitamin D, and has not had bone density scanning in the recent past.  The history is provided by the patient.  Hip Pain  This is a new problem. Episode onset: 3 weeks ago. The problem occurs constantly. The problem has been rapidly worsening. Pertinent negatives include no chest pain and no abdominal pain. The symptoms are aggravated by walking and twisting (sitting). The symptoms are relieved by narcotics. Treatments tried: No new treatments.    Past Medical History:  Diagnosis Date  . Anxiety   . Arthritis    Right Hip  . Breast disorder     Rt Breast Calcifications  . Chronic cystitis   . Depression   . Dyspareunia   . Hypercholesterolemia   . Hypothyroidism   . Plantar fasciitis of left foot    2013  . Torn meniscus 11/13/15   Rt knee    Patient Active Problem List   Diagnosis Date Noted  . Raynaud's phenomenon without gangrene 07/22/2018  . Primary insomnia 07/22/2018  . Hearing loss 07/28/2016  . Loose stools 07/26/2015  . Trapezius strain 07/26/2015  . Primary  osteoarthritis of right knee 05/10/2015  . Lumbar spondylosis 05/10/2015  . Postmenopausal atrophic vaginitis 12/18/2014  . COPD, mild (HCC) 03/30/2014  . Generalized anxiety disorder 01/14/2012  . HEARING DEFICIT 05/29/2011  . PANIC DISORDER 08/12/2010  . LOW BACK PAIN, CHRONIC 07/16/2010  . PELVIC FRACTURE 04/15/2010  . DECREASED LIBIDO 06/26/2009  . Major depressive disorder, recurrent episode, severe (HCC) 12/03/2008  . SCOLIOSIS 11/06/2008  . Osteopenia 09/25/2008  . POSTMENOPAUSAL STATUS 09/11/2008  . Hyperlipidemia 05/28/2008  . ARTHRITIS, HIPS, BILATERAL 02/21/2008  . APHTHOUS ULCERS 11/11/2007  . Hypothyroidism 10/05/2007  . Recurrent urinary tract infection 07/18/2007  . SACROILIITIS, RIGHT 07/18/2007    Past Surgical History:  Procedure Laterality Date  . AUGMENTATION MAMMAPLASTY    . BLEPHAROPLASTY    . BREAST ENHANCEMENT SURGERY    . KNEE SURGERY Right 11/16  . Scoliosis repair with Harrington Rods      OB History    Gravida  0   Para      Term      Preterm      AB      Living        SAB      TAB      Ectopic      Multiple      Live Births  Home Medications    Prior to Admission medications   Medication Sig Start Date End Date Taking? Authorizing Provider  morphine (MSIR) 15 MG tablet Take 15 mg by mouth 3 (three) times daily as needed for severe pain.   Yes [provider]  clonazePAM (KLONOPIN) 0.5 MG tablet Take 1 tablet (0.5 mg total) by mouth daily as needed for anxiety. 30 DAY SUPPLY GIVEN.APPOINTMENT REQUIRED FOR FUTURE REFILLS. 12/06/18   Agapito Games, MD  diclofenac (VOLTAREN) 75 MG EC tablet TAKE 1 TABLET (75 MG TOTAL) 2 (TWO) TIMES DAILY BY MOUTH. 11/25/18 11/25/19  Monica Becton, MD  HYDROcodone-acetaminophen (NORCO) 10-325 MG tablet TAKE 1 TABLET BY MOUTH 6 TIMES DAILY 08/01/18   [provider]  ibuprofen (ADVIL,MOTRIN) 800 MG tablet Take 1 tablet (800 mg total) by mouth every  8 (eight) hours as needed. 06/29/17   Rodolph Bong, MD  lamoTRIgine (LAMICTAL) 25 MG tablet Take 2 tablets (50 mg total) by mouth daily. 11/28/18   Thresa Ross, MD  levothyroxine (SYNTHROID, LEVOTHROID) 88 MCG tablet TAKE 1 TABLET (88 MCG TOTAL) BY MOUTH DAILY BEFORE BREAKFAST. 09/20/18   Agapito Games, MD  meloxicam (MOBIC) 7.5 MG tablet TAKE ONE TABLET (7.5 MG DOSE) BY MOUTH DAILY. 11/15/18   Monica Becton, MD  predniSONE (DELTASONE) 20 MG tablet Take one tab by mouth twice daily for 4 days, then one daily. Take with food. 12/24/18   Lattie Haw, MD  Probiotic Product (PROBIOTIC-10 PO) Take by mouth.    [provider]  promethazine (PHENERGAN) 25 MG tablet Take 1 tablet (25 mg total) by mouth every 8 (eight) hours as needed for nausea or vomiting. Patient not taking: Reported on 11/28/2018 10/27/18   Agapito Games, MD  traMADol (ULTRAM) 50 MG tablet Take 50 mg by mouth 2 (two) times daily. 05/23/18   [provider]  traMADol (ULTRAM-ER) 300 MG 24 hr tablet Take 300 mg by mouth daily. 06/23/18   [provider]  valACYclovir (VALTREX) 500 MG tablet TAKE 1 TABLET (500 MG TOTAL) BY MOUTH DAILY AS NEEDED. 04/19/17   Agapito Games, MD  vortioxetine HBr (TRINTELLIX) 20 MG TABS tablet Take 1 tablet (20 mg total) by mouth daily. 11/28/18 11/28/19  Thresa Ross, MD  zolpidem (AMBIEN) 5 MG tablet TAKE 1 TABLET (5 MG TOTAL) BY MOUTH AT BEDTIME AS NEEDED FOR SLEEP. 12/22/18   Agapito Games, MD  ARIPiprazole (ABILIFY) 5 MG tablet Take 2 tablets (10 mg total) by mouth daily. 02/18/18 11/28/18  Thresa Ross, MD  FLUoxetine (PROZAC) 20 MG tablet Take 20 mg by mouth daily.  11/28/18  [provider]  traZODone (DESYREL) 50 MG tablet Take 0.5-1 tablets (25-50 mg total) by mouth at bedtime as needed for sleep. 01/12/18 06/03/18  Agapito Games, MD    Family History Family History  Problem Relation Age of Onset  . Anxiety disorder Mother    . Depression Mother   . COPD Father        smoker  . Alcohol abuse Father   . Heart murmur Father   . Aneurysm Other   . Drug abuse Brother   . Anxiety disorder Brother   . Depression Brother   . Dementia Maternal Grandfather     Social History Social History   Tobacco Use  . Smoking status: Former Smoker    Last attempt to quit: 09/20/1982    Years since quitting: 36.2  . Smokeless tobacco: Never Used  Substance  Use Topics  . Alcohol use: Yes    Alcohol/week: 0.0 standard drinks    Comment: Use is 1-2 a month.  . Drug use: No     Allergies   Bupropion; Sulfa antibiotics; Belsomra [suvorexant]; and Ciprofloxacin   Review of Systems Review of Systems  Constitutional: Positive for activity change. Negative for appetite change, chills, diaphoresis, fatigue, fever and unexpected weight change.  HENT: Negative.   Eyes: Negative.   Respiratory: Negative.   Cardiovascular: Negative for chest pain.  Gastrointestinal: Negative for abdominal pain.  Genitourinary: Negative.   Musculoskeletal: Positive for back pain.  Neurological: Negative for numbness.  All other systems reviewed and are negative.    Physical Exam Triage Vital Signs ED Triage Vitals  Enc Vitals Group     BP 12/24/18 1022 118/78     Pulse Rate 12/24/18 1022 76     Resp 12/24/18 1022 18     Temp 12/24/18 1022 98.1 F (36.7 C)     Temp Source 12/24/18 1022 Oral     SpO2 12/24/18 1022 100 %     Weight 12/24/18 1023 135 lb (61.2 kg)     Height 12/24/18 1023 5\' 8"  (1.727 m)     Head Circumference --      Peak Flow --      Pain Score 12/24/18 1023 9     Pain Loc --      Pain Edu? --      Excl. in GC? --    No data found.  Updated Vital Signs BP 118/78   Pulse 76   Temp 98.1 F (36.7 C) (Oral)   Resp 18   Ht 5\' 8"  (1.727 m)   Wt 61.2 kg   SpO2 100%   BMI 20.53 kg/m   Visual Acuity Right Eye Distance:   Left Eye Distance:   Bilateral Distance:    Right Eye Near:   Left Eye Near:     Bilateral Near:     Physical Exam Vitals signs and nursing note reviewed.  Constitutional:      General: She is in acute distress.     Appearance: She is not ill-appearing or diaphoretic.  HENT:     Head: Normocephalic.     Right Ear: External ear normal.     Left Ear: External ear normal.     Nose: Nose normal.     Mouth/Throat:     Mouth: Mucous membranes are moist.  Eyes:     Extraocular Movements: Extraocular movements intact.     Pupils: Pupils are equal, round, and reactive to light.  Neck:     Musculoskeletal: Normal range of motion.  Cardiovascular:     Heart sounds: Normal heart sounds.  Pulmonary:     Breath sounds: Normal breath sounds.  Abdominal:     General: Abdomen is flat.     Tenderness: There is no abdominal tenderness.  Musculoskeletal:     Lumbar back: She exhibits decreased range of motion, tenderness and bony tenderness. She exhibits no swelling.       Back:     Right lower leg: No edema.     Left lower leg: No edema.     Comments: Back:  Range of motion decreased.  Can heel/toe walk and squat without difficulty.  Tenderness in the midline and right paraspinous muscles from L4 to Sacral area.  Straight leg raising test is negative.  Sitting knee extension test is negative.  Strength and sensation in the lower extremities is  normal.  Patellar and achilles reflexes are normal   Note right hip has normal range of motion without tenderness to palpation over the greater trochanter. Negative FABER.  Neurological:     Mental Status: She is alert.      UC Treatments / Results  Labs (all labs ordered are listed, but only abnormal results are displayed) Labs Reviewed - No data to display  EKG None  Radiology Dg Lumbar Spine Complete  Result Date: 12/24/2018 CLINICAL DATA:  Right-sided low back pain 3 weeks as well as right posterior hip pain. EXAM: LUMBAR SPINE - COMPLETE 4+ VIEW COMPARISON:  05/10/2015 FINDINGS: Curvature of the lumbar spine convex  left unchanged. Mild spondylosis throughout the lumbar spine to include facet arthropathy. Disc space narrowing at the L2-3 level and L5-S1 level unchanged. No acute compression fracture. Partially visualized stabilization hardware over the thoracic spine extending to the L1 level unchanged. IMPRESSION: No acute findings. Mild spondylosis throughout the lumbar spine with disc disease at the L2-3 and L5-S1 levels unchanged. Partially visualized stabilization hardware over the thoracic spine extending to L1 unchanged. Stable curvature of the lumbar spine convex left. Electronically Signed   By: Elberta Fortis M.D.   On: 12/24/2018 11:09   Dg Hip Unilat W Or Wo Pelvis 2-3 Views Right  Result Date: 12/24/2018 CLINICAL DATA:  Right posterior hip pain 3 weeks.  No injury. EXAM: DG HIP (WITH OR WITHOUT PELVIS) 2-3V RIGHT COMPARISON:  02/17/2010 FINDINGS: Mild diffuse decreased bone density. Minimal symmetric degenerative change of the hips. Mild degenerate change of the spine. Old right inferior pubic ramus fracture. IMPRESSION: No acute fracture. Electronically Signed   By: Elberta Fortis M.D.   On: 12/24/2018 11:02    Procedures Procedures (including critical care time)  Medications Ordered in UC Medications  ketorolac (TORADOL) injection 60 mg (60 mg Intramuscular Given 12/24/18 1145)    Initial Impression / Assessment and Plan / UC Course  I have reviewed the triage vital signs and the nursing notes.  Pertinent labs & imaging results that were available during my care of the patient were reviewed by me and considered in my medical decision making (see chart for details).    Suspect osteoporosis. Appears to have acute well localized acute L5-S1 radiculopathy. Administered Toradol 60mg  IM.  Begin prednisone burst/taper. Followup with Althea Grimmer, FNP (osteoporosis clinic) [facility] for evaluation of osteopenia/osteoporosis Followup with Dr. Romeo Rabon   Final Clinical  Impressions(s) / UC Diagnoses   Final diagnoses:  Chronic bilateral low back pain without sciatica  Acute right-sided low back pain with right-sided sciatica  Osteopenia determined by x-ray     Discharge Instructions     Apply ice pack for 20 to 30 minutes, 3 to 4 times daily  Continue until pain and swelling decrease.     ED Prescriptions    Medication Sig Dispense Auth. Provider   predniSONE (DELTASONE) 20 MG tablet Take one tab by mouth twice daily for 4 days, then one daily. Take with food. 12 tablet Lattie Haw, MD         Lattie Haw, MD 12/24/18 [phone]

## 2018-12-24 NOTE — ED Triage Notes (Signed)
Patient crying as walking to room; states severe pain in right hip which started 3 weeks ago; feels like spasms especially when changing positions; took MS 15 at 1000 today.

## 2018-12-27 ENCOUNTER — Other Ambulatory Visit: Payer: Self-pay | Admitting: Family Medicine

## 2018-12-28 ENCOUNTER — Other Ambulatory Visit (HOSPITAL_COMMUNITY): Payer: Self-pay | Admitting: Psychiatry

## 2018-12-28 ENCOUNTER — Telehealth: Payer: Self-pay

## 2018-12-28 MED ORDER — CLONAZEPAM 0.5 MG PO TABS: 0.5000 mg | ORAL_TABLET | Freq: Every day | ORAL | 0 refills | Status: DC | PRN

## 2018-12-28 NOTE — Telephone Encounter (Signed)
Patient advised.

## 2018-12-28 NOTE — Telephone Encounter (Signed)
Prescription sent to pharmacy.  She really needs to be using these very sparingly especially since she is on chronic narcotics.

## 2018-12-28 NOTE — Telephone Encounter (Signed)
Pt called and left a VM requesting a rx for [facility]ax sent to [facility] [place].

## 2018-12-28 NOTE — Telephone Encounter (Signed)
Left vm informing patient per Dr. Gilmore Laroche he is not willing to give a rx for xanax. She needs to contact her pcp and ask for a refill on klonopine. Nothing further is needed at this time.

## 2018-12-28 NOTE — Telephone Encounter (Signed)
[name] called and states she has an appointment on Friday with Dr Linford Arnold. She wanted to know if she could get a few Clonazepam until Friday. Last refill for 10 tablets was on 12/06/18. Please advise.

## 2018-12-28 NOTE — Telephone Encounter (Signed)
She gets klonopine from primary care. She needs to ask refill or adjustment if any from primary care

## 2018-12-30 ENCOUNTER — Encounter: Payer: Self-pay | Admitting: Family Medicine

## 2018-12-30 ENCOUNTER — Ambulatory Visit: Payer: No Typology Code available for payment source | Admitting: Family Medicine

## 2018-12-30 VITALS — BP 109/62 | HR 71 | Ht 68.0 in | Wt 131.0 lb

## 2018-12-30 DIAGNOSIS — Z23 Encounter for immunization: Secondary | ICD-10-CM

## 2018-12-30 DIAGNOSIS — F411 Generalized anxiety disorder: Secondary | ICD-10-CM | POA: Diagnosis not present

## 2018-12-30 DIAGNOSIS — R0602 Shortness of breath: Secondary | ICD-10-CM

## 2018-12-30 DIAGNOSIS — Z7184 Encounter for health counseling related to travel: Secondary | ICD-10-CM

## 2018-12-30 MED ORDER — ESCITALOPRAM OXALATE 10 MG PO TABS: ORAL_TABLET | ORAL | 1 refills | Status: DC

## 2018-12-30 MED ORDER — TYPHOID VACCINE PO CPDR: 1.0000 | DELAYED_RELEASE_CAPSULE | ORAL | 0 refills | Status: DC

## 2018-12-30 MED ORDER — AZITHROMYCIN 250 MG PO TABS: ORAL_TABLET | ORAL | 0 refills | Status: AC

## 2018-12-30 NOTE — Progress Notes (Signed)
Subjective:    CC:   HPI: Pt is traveling to [place] soon, where [place]. Questions what vaccines (or titers) she needs before leaving.  She did go and have her measles mumps rubella titers drawn and she is immunized for those.  Recommending hepatitis A B today.  As well as a prescription for oral typhoid.  Anxiety-she is also here to discuss anxiety.  She has a prescription for clonazepam.  She because last month for an early refill she been under a lot of stress helping to organize her husband's retirement party.  She says she did not realize that she was not supposed to be taking more than 1 a day.  He is also on current chronic hydrocodone.  Go ahead and fill the prescription but notified her that this really is as needed use and should not be used daily and that she was welcome to come in to discuss other medication treatment options.  She also reports feeling short of breath for about 4 days.  She denies any chest pain discomfort or cough.  No fevers chills or sweats.  No sore throat or ear pain she just noticed that she has been a little bit more breathless over the last few days.  And she is also felt like she is had a little less energy.  Past medical history, Surgical history, Family history not pertinant except as noted below, Social history, Allergies, and medications have been entered into the medical record, reviewed, and corrections made.   Review of Systems: No fevers, chills, night sweats, weight loss, chest pain, or shortness of breath.   Objective:    General: Well Developed, well nourished, and in no acute distress.  Neuro: Alert and oriented x3, extra-ocular muscles intact, sensation grossly intact.  HEENT: Normocephalic, atraumatic, OP is clear, TMs clear bilaterally. No sig cervical lymphadenopathy  Skin: Warm and dry, no rashes. Cardiac: Regular rate and rhythm, no murmurs rubs or gallops, no lower extremity  edema.  Respiratory: Clear to auscultation bilaterally. Not using accessory muscles, speaking in full sentences.   Impression and Recommendations:    [place]  Given first Twinrix today.  If in 1 week for second Twinrix for expedited vaccination schedule.  Also give azithr rx for travelers diarrhea or URI.  Recommend take some OTC meds with her for travel as well.  Gave her a handout from the [facility] for recommended packing list.  Anxiety -and about the potential increased risk for death with a combination of benzodiazepine use and chronic narcotics.  We discussed that we really need to put her on a controller so that she can decrease her use of benzodiazepines which are quite risky and can cause dependency and even a rebound phenomenon.  SOB -exam is clear I do not hear any abnormal sounds or wheezing.  She does not have a cough, sputum production or fever to indicate respiratory infection.  Will check a CBC today. Consider CXR if not improving but lung exam is clear.

## 2018-12-31 LAB — CBC WITH DIFFERENTIAL/PLATELET
Absolute Monocytes: 356 cells/uL (ref 200–950)
Basophils Absolute: 27 cells/uL (ref 0–200)
Basophils Relative: 0.3 %
EOS ABS: 0 {cells}/uL — AB (ref 15–500)
Eosinophils Relative: 0 %
HCT: 41.3 % (ref 35.0–45.0)
Hemoglobin: 14 g/dL (ref 11.7–15.5)
Lymphs Abs: 1584 cells/uL (ref 850–3900)
MCH: 30.4 pg (ref 27.0–33.0)
MCHC: 33.9 g/dL (ref 32.0–36.0)
MCV: 89.6 fL (ref 80.0–100.0)
MPV: 10 fL (ref 7.5–12.5)
Monocytes Relative: 4 %
Neutro Abs: 6933 cells/uL (ref 1500–7800)
Neutrophils Relative %: 77.9 %
Platelets: 405 10*3/uL — ABNORMAL HIGH (ref 140–400)
RBC: 4.61 10*6/uL (ref 3.80–5.10)
RDW: 12.6 % (ref 11.0–15.0)
Total Lymphocyte: 17.8 %
WBC: 8.9 10*3/uL (ref 3.8–10.8)

## 2018-12-31 LAB — COMPLETE METABOLIC PANEL WITH GFR
AG Ratio: 1.8 (calc) (ref 1.0–2.5)
ALKALINE PHOSPHATASE (APISO): 52 U/L (ref 33–130)
ALT: 9 U/L (ref 6–29)
AST: 12 U/L (ref 10–35)
Albumin: 4.4 g/dL (ref 3.6–5.1)
BUN: 25 mg/dL (ref 7–25)
CO2: 24 mmol/L (ref 20–32)
Calcium: 10.2 mg/dL (ref 8.6–10.4)
Chloride: 105 mmol/L (ref 98–110)
Creat: 0.9 mg/dL (ref 0.50–0.99)
GFR, Est African American: 79 mL/min/{1.73_m2} (ref >=60)
GFR, Est Non African American: 68 mL/min/{1.73_m2} (ref >=60)
Globulin: 2.4 g/dL (calc) (ref 1.9–3.7)
Glucose, Bld: 105 mg/dL — ABNORMAL HIGH (ref 65–99)
Potassium: 5.1 mmol/L (ref 3.5–5.3)
Sodium: 138 mmol/L (ref 135–146)
Total Bilirubin: 0.6 mg/dL (ref 0.2–1.2)
Total Protein: 6.8 g/dL (ref 6.1–8.1)

## 2019-01-02 NOTE — Progress Notes (Signed)
All labs are normal. 

## 2019-01-04 ENCOUNTER — Telehealth: Payer: Self-pay | Admitting: Family Medicine

## 2019-01-04 ENCOUNTER — Ambulatory Visit (INDEPENDENT_AMBULATORY_CARE_PROVIDER_SITE_OTHER): Payer: No Typology Code available for payment source

## 2019-01-04 DIAGNOSIS — R531 Weakness: Secondary | ICD-10-CM | POA: Diagnosis not present

## 2019-01-04 DIAGNOSIS — R0602 Shortness of breath: Secondary | ICD-10-CM

## 2019-01-04 MED ORDER — ALBUTEROL SULFATE HFA 108 (90 BASE) MCG/ACT IN AERS: 2.0000 | INHALATION_SPRAY | Freq: Four times a day (QID) | RESPIRATORY_TRACT | 0 refills | Status: DC | PRN

## 2019-01-04 NOTE — Telephone Encounter (Signed)
Pt called clinic stating she is not feeling better and would like the inhaler that was discussed at last OV sent to her local pharmacy. Routing.

## 2019-01-04 NOTE — Telephone Encounter (Signed)
Pt advised.

## 2019-01-04 NOTE — Telephone Encounter (Signed)
rx sent for albuterol. She can also go for the chest xray since she is not better.

## 2019-01-09 ENCOUNTER — Ambulatory Visit (INDEPENDENT_AMBULATORY_CARE_PROVIDER_SITE_OTHER): Payer: No Typology Code available for payment source | Admitting: Family Medicine

## 2019-01-09 ENCOUNTER — Telehealth: Payer: Self-pay

## 2019-01-09 VITALS — BP 110/68 | HR 63 | Temp 98.7°F

## 2019-01-09 DIAGNOSIS — Z23 Encounter for immunization: Secondary | ICD-10-CM

## 2019-01-09 NOTE — Telephone Encounter (Signed)
This has been routed to PCP for review.  

## 2019-01-09 NOTE — Telephone Encounter (Signed)
PT was in the office for a nurse visit and left a note at the front counter for Dr. Linford Arnold.   "Questions for Dr. Linford Arnold,  1. About 3 months ago my left index finger started going numb off and on. Why is that the answer are in my records. Dr. Judie Petit was at a conference. I saw a PA.  2. Do I need to be on Escitalopram the rest of my life?"  Phone call on file is correct.

## 2019-01-09 NOTE — Progress Notes (Signed)
Agree with documentation as above.   Catherine Metheney, MD  

## 2019-01-09 NOTE — Progress Notes (Signed)
Pt presents to office for second Twinrix in preparation for her travels to [place].   Pt tolerated injection well in right deltoid without any complications.

## 2019-01-18 NOTE — Telephone Encounter (Signed)
Sorry for the delay in getting back to her.  If she still having numbness in that finger then I would be happy to see her back.  I know she was evaluated by Lesly Rubenstein.  In regards to the citalopram.  Typically we try to wean people off of these medications after about 6 to 12 months of reaching stability mood wise on them.  Though sometimes we will use them longer it just depends on the situation usually we do not keep people on them for their lifetime though there are some rare instances where we do.   Hopefully this helps answer her questions.

## 2019-01-19 NOTE — Telephone Encounter (Signed)
Called and advised pt of recommendations.   Pt states she is still having some issues with the finger numbness, but did not want to have follow up appointment about it. Pt wanted to know what Lesly Rubenstein thought it was, advised her that Jades note read: "-Hand numbness: intermittent distal finger numbness and discoloration. Likely Raynauds. Pt told likely caused from cold weather, stress, caffeine use. Not a smoker. Pt told to follow up if worsens. HO on Raynauds given. Encouraged to avoid stressors."  Pt agreeable with Jade's thought on situation, states she currently has a lot of stressors.   As far as medication, pt OK with Dr Shelah Lewandowsky note about length of treatment time. Understands this varies with different people and did not have any further questions or concerns.

## 2019-01-22 ENCOUNTER — Other Ambulatory Visit: Payer: Self-pay | Admitting: Family Medicine

## 2019-01-26 ENCOUNTER — Telehealth (HOSPITAL_COMMUNITY): Payer: Self-pay | Admitting: Psychiatry

## 2019-01-26 ENCOUNTER — Telehealth: Payer: Self-pay

## 2019-01-26 NOTE — Telephone Encounter (Signed)
Unfortunately, she is already on the 20 mg dose.  This is the highest strength that it comes in.  I would be happy to refer her to a psychiatrist for medication management for consultation for input if she would like.  We also have a new counselor with this and so she just feels that talking with someone might be helpful we have hired a new therapist here in our office name Shanda BumpsJessica and we could get her on her schedule.

## 2019-01-26 NOTE — Telephone Encounter (Signed)
Kenyon called and states the Trintellix is not working very well. She would like an increase in the Trintellix. Please advise.

## 2019-01-26 NOTE — Telephone Encounter (Signed)
Keyri called back and states it is the Lexapro she was referring to not the Trintellix. Please advise.

## 2019-01-26 NOTE — Telephone Encounter (Signed)
Pt is requesting a medication for anxiety.  She states she is also trying to get something from Dr. Linford Arnold for this as well. However Dr. Linford Arnold told her that she is on the highest dose that Trintellix offers.   Please advise.

## 2019-01-26 NOTE — Telephone Encounter (Signed)
Spoke with Pt, advised of increase denial and recommendation. Verbalized understanding. She sees Dr Fredda Hammed, will call his office about possible Rx change.

## 2019-01-27 ENCOUNTER — Other Ambulatory Visit (HOSPITAL_COMMUNITY): Payer: Self-pay

## 2019-01-27 MED ORDER — HYDROXYZINE HCL 10 MG PO TABS: 10.0000 mg | ORAL_TABLET | ORAL | 0 refills | Status: DC | PRN

## 2019-01-27 NOTE — Telephone Encounter (Signed)
Patient called back stating she is no longer taking clonazepam and is willing to try vistaril. Per Dr. Gilmore Laroche I sent a 30 day supply to pharmacy. Nothing further is needed at this time.

## 2019-01-27 NOTE — Telephone Encounter (Signed)
She gets klonopine from Dr. Linford ArnoldMetheney.  Can use vistaril 10mg  prn for anxiety if need

## 2019-01-27 NOTE — Telephone Encounter (Signed)
Left vm informing patient to call back if she is willing to take vistaril 10mg  prn for anxiety.

## 2019-02-12 ENCOUNTER — Other Ambulatory Visit: Payer: Self-pay | Admitting: Family Medicine

## 2019-02-12 DIAGNOSIS — F5101 Primary insomnia: Secondary | ICD-10-CM

## 2019-02-19 ENCOUNTER — Other Ambulatory Visit (HOSPITAL_COMMUNITY): Payer: Self-pay | Admitting: Psychiatry

## 2019-02-20 ENCOUNTER — Ambulatory Visit (HOSPITAL_COMMUNITY): Payer: No Typology Code available for payment source | Admitting: Psychiatry

## 2019-02-21 ENCOUNTER — Other Ambulatory Visit: Payer: Self-pay | Admitting: Family Medicine

## 2019-02-24 ENCOUNTER — Ambulatory Visit (INDEPENDENT_AMBULATORY_CARE_PROVIDER_SITE_OTHER): Payer: No Typology Code available for payment source | Admitting: Psychiatry

## 2019-02-24 ENCOUNTER — Ambulatory Visit (INDEPENDENT_AMBULATORY_CARE_PROVIDER_SITE_OTHER): Payer: No Typology Code available for payment source

## 2019-02-24 ENCOUNTER — Other Ambulatory Visit: Payer: Self-pay

## 2019-02-24 ENCOUNTER — Encounter (HOSPITAL_COMMUNITY): Payer: Self-pay | Admitting: Psychiatry

## 2019-02-24 VITALS — BP 118/70 | HR 68 | Ht 68.0 in | Wt 134.0 lb

## 2019-02-24 DIAGNOSIS — Z1231 Encounter for screening mammogram for malignant neoplasm of breast: Secondary | ICD-10-CM

## 2019-02-24 DIAGNOSIS — F4321 Adjustment disorder with depressed mood: Secondary | ICD-10-CM

## 2019-02-24 DIAGNOSIS — F0630 Mood disorder due to known physiological condition, unspecified: Secondary | ICD-10-CM

## 2019-02-24 DIAGNOSIS — F419 Anxiety disorder, unspecified: Secondary | ICD-10-CM

## 2019-02-24 DIAGNOSIS — F411 Generalized anxiety disorder: Secondary | ICD-10-CM

## 2019-02-24 MED ORDER — VORTIOXETINE HBR 20 MG PO TABS: 20.0000 mg | ORAL_TABLET | Freq: Every day | ORAL | 4 refills | Status: AC

## 2019-02-24 MED ORDER — LAMOTRIGINE 25 MG PO TABS: 50.0000 mg | ORAL_TABLET | Freq: Two times a day (BID) | ORAL | 4 refills | Status: AC

## 2019-02-24 MED ORDER — HYDROXYZINE HCL 10 MG PO TABS: 10.0000 mg | ORAL_TABLET | ORAL | 2 refills | Status: DC | PRN

## 2019-02-24 NOTE — Patient Instructions (Signed)
Can be discharged, leaving for [place] . Retiring. Renewed meds for 54m.

## 2019-02-24 NOTE — Progress Notes (Signed)
Patient ID: [name], female   DOB: 19-Feb-1956, 63 y.o.   MRN: [id number]   [facility] Follow-up Outpatient Visit  [name] Sep 04, 1956  Date: 103/05/2019  Chief Complaint:  Follow Up. Depression and anxiety  History of Chief Complaint:   HPI Comments: [name] is a 63 y/o female with a past psychiatric history significant for Major Depression, Recurrent severe, generalized anxiety and bereavement. The patient returns  for psychiatric services and medication management.    Her depression dates back to 2008 when she lost her mom and also her brother was using drugs  She is doing fair she is moving to [place] so wants to be have enough medication she may look for a psychiatrist her primary care   We discussed Lamictal no rash.  Trintellix is also having depression.  She is also taking Vistaril instead of Klonopin now it does help as needed anxiety  Job :  retiring   Husband retireing has had distant relationship but planning together now vacations   Grief: not worse   . Modifying factors: exercise when she can  Review of Systems  Constitutional: Negative for fever.  Cardiovascular: Negative for chest pain and palpitations.  Gastrointestinal: Negative for nausea.  Skin: Negative for itching.  Psychiatric/Behavioral: Negative for depression.     Vitals:   02/24/19 1200  BP: 118/70  Pulse: 68  Weight: 134 lb (60.8 kg)  Height:  (1.727 m)   Physical Exam  Vitals reviewed.  Constitutional: She appears well-developed and well-nourished. No distress.  Skin: She is not diaphoretic.     Past Medical History: Reviewed Past Medical History:  Diagnosis Date  . Anxiety   . Arthritis    Right Hip  . Breast disorder     Rt Breast Calcifications  . Chronic cystitis   . Depression   . Dyspareunia   . Hypercholesterolemia   . Hypothyroidism   . Plantar fasciitis of left foot    2013  . Torn meniscus 11/13/15   Rt knee   Allergies:  Reviewed Allergies  Allergen Reactions  . Bupropion     Other reaction(s): Other Suicidal thoughts  . Sulfa Antibiotics Itching and Hives  . Belsomra [Suvorexant] Other (See Comments)    Nightmares  . Ciprofloxacin Other (See Comments)    GI upset   Current Medications: Reviewed Current Outpatient Medications on File Prior to Visit  Medication Sig Dispense Refill  . albuterol (PROVENTIL HFA;VENTOLIN HFA) 108 (90 Base) MCG/ACT inhaler TAKE 2 PUFFS BY MOUTH EVERY 6 HOURS AS NEEDED FOR WHEEZE OR SHORTNESS OF BREATH 6.7 Inhaler 0  . clonazePAM (KLONOPIN) 0.5 MG tablet Take 1 tablet (0.5 mg total) by mouth daily as needed for anxiety. 30 DAY SUPPLY GIVEN.APPOINTMENT REQUIRED FOR FUTURE REFILLS. 10 tablet 0  . diclofenac (VOLTAREN) 75 MG EC tablet TAKE 1 TABLET (75 MG TOTAL) 2 (TWO) TIMES DAILY BY MOUTH. 180 tablet 1  . escitalopram (LEXAPRO) 10 MG tablet TAKE 1/2 TABLET BY MOUTH ONCE A DAY X 10 DAYS, THEN INCREASE TO 1 TAB DAILY. 30 tablet 1  . gabapentin (NEURONTIN) 300 MG capsule Take 300 mg by mouth at bedtime.    Marland Kitchen levothyroxine (SYNTHROID, LEVOTHROID) 88 MCG tablet TAKE 1 TABLET (88 MCG TOTAL) BY MOUTH DAILY BEFORE BREAKFAST. 34 tablet 3  . meloxicam (MOBIC) 7.5 MG tablet TAKE ONE TABLET (7.5 MG DOSE) BY MOUTH DAILY. 30 tablet 2  . morphine (MSIR) 15 MG tablet Take 15 mg by mouth 3 (three) times daily as  needed for severe pain.    . naloxone (NARCAN) nasal spray 4 mg/0.1 mL Place into the nose.    . Probiotic Product (PROBIOTIC-10 PO) Take by mouth.    . typhoid (VIVOTIF) DR capsule Take 1 capsule by mouth every other day. 4 capsule 0  . valACYclovir (VALTREX) 500 MG tablet TAKE 1 TABLET (500 MG TOTAL) BY MOUTH DAILY AS NEEDED. 90 tablet 1  . zolpidem (AMBIEN) 5 MG tablet TAKE ONE TAB BY MOUTH AT BEDTIME AS NEEDED FOR SLEEP 30 tablet 1  . [DISCONTINUED] ARIPiprazole (ABILIFY) 5 MG tablet Take 2 tablets (10 mg total) by mouth daily. 180 tablet 0  . [DISCONTINUED] FLUoxetine (PROZAC) 20 MG  tablet Take 20 mg by mouth daily.    . [DISCONTINUED] traZODone (DESYREL) 50 MG tablet Take 0.5-1 tablets (25-50 mg total) by mouth at bedtime as needed for sleep. 90 tablet 1   No current facility-administered medications on file prior to visit.      Substance Abuse History in the last 12 months: Reviewed Social History   Tobacco Use  . Smoking status: Former Smoker    Last attempt to quit: 09/20/1982    Years since quitting: 36.4  . Smokeless tobacco: Never Used  Substance Use Topics  . Alcohol use: Yes    Alcohol/week: 0.0 standard drinks    Comment: Use is 1-2 a month.  Caffeine: 2 cups a day   Family History: Reviewed Family History   Problem  Relation  Age of Onset   .  COPD     .  Aneurysm     .  Anxiety disorder  Mother    .  Depression  Mother    .  Alcohol abuse  Father    .  Heart murmur  Father    .  Drug abuse  Brother    .  Anxiety disorder  Brother    .  Depression  Brother    .  Dementia  Maternal Grandfather     Psychiatric Specialty Exam:  Objective: Appearance: Casual and Well Groomed   Eye Contact:: Good   Speech: Clear and Coherent and Normal Rate   Volume: Normal   Mood: fair   Affect:  improved  Thought Process: Coherent, Logical   Orientation: Full   Thought Content: WDL   Suicidal Thoughts: No   Homicidal Thoughts: No   Judgement: Fair   Insight: Fair   Psychomotor Activity: Normal   Akathisia: No   Handed: Left   Memory: Immediate 3/3, recent: 1/3   . Assets: Communication Skills  Desire for Improvement  Financial Resources/Insurance  Housing  Leisure Time  Physical Health  Resilience  Transportation  Vocational/Educational    Laboratory/X-Ray  Psychological Evaluation(s)   NONE  NONE    Assessment:  AXIS I  Major Depression, Recurrent -. Generalized anxiety disorder. bereavement  AXIS II  No diagnosis   AXIS III                                                                            AXIS IV    AXIS V      Treatment Plan/Recommendations:   Plan of Care:  PLAN:  1. Affirm with the  patient that the medications are taken as ordered. Patient  expressed understanding of how their medications were to be used.    Laboratory:    Psychotherapy: Therapy: brief supportive therapy provided.  Discussed psychosocial stressors in detail. More than 50% of the visit was spent on individual therapy/counseling.   Medications:   1. Depression:doing  Fair . Continue lamictal and trintellix  2. Anxiety disorder; not worse. Takes prn klonoopine from primary care. Have been taking vistaril instead mostly  3. Grief. Not worse Planning retirement. Reviewed meds She is retiring. Can discharge . She will follow up with [place]  Reviewed meds. Given enough refills            Thresa Ross, MD

## 2019-03-14 ENCOUNTER — Other Ambulatory Visit: Payer: Self-pay

## 2019-03-14 ENCOUNTER — Other Ambulatory Visit (HOSPITAL_COMMUNITY)
Admission: RE | Admit: 2019-03-14 | Discharge: 2019-03-14 | Disposition: A | Discharge status: Home / Self Care | Payer: No Typology Code available for payment source | Source: Ambulatory Visit | Attending: Family Medicine | Admitting: Family Medicine

## 2019-03-14 ENCOUNTER — Ambulatory Visit (INDEPENDENT_AMBULATORY_CARE_PROVIDER_SITE_OTHER): Payer: No Typology Code available for payment source | Admitting: Family Medicine

## 2019-03-14 ENCOUNTER — Encounter: Payer: Self-pay | Admitting: Family Medicine

## 2019-03-14 VITALS — BP 111/59 | HR 71 | Ht 68.0 in | Wt 135.0 lb

## 2019-03-14 DIAGNOSIS — N952 Postmenopausal atrophic vaginitis: Secondary | ICD-10-CM | POA: Diagnosis not present

## 2019-03-14 DIAGNOSIS — Z124 Encounter for screening for malignant neoplasm of cervix: Secondary | ICD-10-CM | POA: Insufficient documentation

## 2019-03-14 DIAGNOSIS — T192XXA Foreign body in vulva and vagina, initial encounter: Secondary | ICD-10-CM | POA: Diagnosis not present

## 2019-03-14 MED ORDER — METRONIDAZOLE 500 MG PO TABS: 500.0000 mg | ORAL_TABLET | Freq: Two times a day (BID) | ORAL | 0 refills | Status: AC

## 2019-03-14 MED ORDER — ESTROGENS, CONJUGATED 0.625 MG/GM VA CREA: 1.0000 | TOPICAL_CREAM | Freq: Every day | VAGINAL | 12 refills | Status: AC

## 2019-03-14 NOTE — Progress Notes (Signed)
Acute Office Visit  Subjective:    Patient ID: [name], female    DOB: 08/27/1956, 63 y.o.   MRN: [id number]  Chief Complaint  Patient presents with  . Foreign Body in Vagina    HPI Patient is in today for foreign body in the vagina.  Earlier this morning she was using a vibrator/sex toy and it had a condom on it that broke off. She says it is purple.  Her husband tried to get it out but it started to get painful and uncomfortable so she called.  Last Pap smear was 3 years ago in 2017.  She does have some vaginal atrophy so does not have sex very often with her husband because of pain and discomfort.  In fact at one point in time we had prescribed some estradiol cream but she says it was so expensive she never picked it up.  She is moving to [place] at the end of the Month and is excited about this.    Past Medical History:  Diagnosis Date  . Anxiety   . Arthritis    Right Hip  . Breast disorder     Rt Breast Calcifications  . Chronic cystitis   . Depression   . Dyspareunia   . Hypercholesterolemia   . Hypothyroidism   . Plantar fasciitis of left foot    2013  . Torn meniscus 11/13/15   Rt knee    Past Surgical History:  Procedure Laterality Date  . AUGMENTATION MAMMAPLASTY    . BLEPHAROPLASTY    . BREAST ENHANCEMENT SURGERY    . KNEE SURGERY Right 11/16  . Scoliosis repair with Harrington Rods      Family History  Problem Relation Age of Onset  . Anxiety disorder Mother   . Depression Mother   . COPD Father        smoker  . Alcohol abuse Father   . Heart murmur Father   . Aneurysm Other   . Drug abuse Brother   . Anxiety disorder Brother   . Depression Brother   . Dementia Maternal Grandfather     Social History   Socioeconomic History  . Marital status: Married    Spouse name: Not on file  . Number of children: Not on file  . Years of education: Not on file  . Highest education level: Not on file  Occupational History  . 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 Financial resource strain: Not on file  . Food insecurity:    Worry: Not on file    Inability: Not on file  . Transportation needs:    Medical: Not on file    Non-medical: Not on file  Tobacco Use  . Smoking status: Former Smoker    Last attempt to quit: 09/20/1982    Years since quitting: 36.5  . Smokeless tobacco: Never Used  Substance and Sexual Activity  . Alcohol use: Yes    Alcohol/week: 0.0 standard drinks    Comment: Use is 1-2 a month.  . Drug use: No  . Sexual activity: Not Currently    Partners: Male    Birth control/protection: None  Lifestyle  . Physical activity:    Days per week: Not on file    Minutes per session: Not on file  . Stress: Not on file  Relationships  . Social connections:    Talks on phone: Not on file    Gets together: Not on file  Attends religious service: Not on file    Active member of club or organization: Not on file    Attends meetings of clubs or organizations: Not on file    Relationship status: Not on file  . Intimate partner violence:    Fear of current or ex partner: Not on file    Emotionally abused: Not on file    Physically abused: Not on file    Forced sexual activity: Not on file  Other Topics Concern  . Not on file  Social History Narrative   She works in indepednet living with seniors.      Outpatient Medications Prior to Visit  Medication Sig Dispense Refill  . albuterol (PROVENTIL HFA;VENTOLIN HFA) 108 (90 Base) MCG/ACT inhaler TAKE 2 PUFFS BY MOUTH EVERY 6 HOURS AS NEEDED FOR WHEEZE OR SHORTNESS OF BREATH 6.7 Inhaler 0  . escitalopram (LEXAPRO) 10 MG tablet TAKE 1/2 TABLET BY MOUTH ONCE A DAY X 10 DAYS, THEN INCREASE TO 1 TAB DAILY. 30 tablet 1  . hydrOXYzine (ATARAX/VISTARIL) 10 MG tablet Take 1 tablet (10 mg total) by mouth as needed. Take 1 tablet by mouth daily as needed for anxiety 30 tablet 2  . lamoTRIgine (LAMICTAL) 25 MG tablet Take 2 tablets (50  mg total) by mouth 2 (two) times daily. 60 tablet 4  . levothyroxine (SYNTHROID, LEVOTHROID) 88 MCG tablet TAKE 1 TABLET (88 MCG TOTAL) BY MOUTH DAILY BEFORE BREAKFAST. 34 tablet 3  . meloxicam (MOBIC) 7.5 MG tablet TAKE ONE TABLET (7.5 MG DOSE) BY MOUTH DAILY. 30 tablet 2  . morphine (MSIR) 15 MG tablet Take by mouth.    Melene Muller ON 04/04/2019] morphine (MSIR) 15 MG tablet Take by mouth.    . naloxone (NARCAN) nasal spray 4 mg/0.1 mL Place into the nose.    . Probiotic Product (PROBIOTIC-10 PO) Take by mouth.    . valACYclovir (VALTREX) 500 MG tablet TAKE 1 TABLET (500 MG TOTAL) BY MOUTH DAILY AS NEEDED. 90 tablet 1  . vortioxetine HBr (TRINTELLIX) 20 MG TABS tablet Take 1 tablet (20 mg total) by mouth daily. 30 tablet 4  . zolpidem (AMBIEN) 5 MG tablet TAKE ONE TAB BY MOUTH AT BEDTIME AS NEEDED FOR SLEEP 30 tablet 1  . ARIPiprazole (ABILIFY) 5 MG tablet Take 2 tablets (10 mg total) by mouth daily. 180 tablet 0  . clonazePAM (KLONOPIN) 0.5 MG tablet Take 1 tablet (0.5 mg total) by mouth daily as needed for anxiety. 30 DAY SUPPLY GIVEN.APPOINTMENT REQUIRED FOR FUTURE REFILLS. 10 tablet 0  . diclofenac (VOLTAREN) 75 MG EC tablet TAKE 1 TABLET (75 MG TOTAL) 2 (TWO) TIMES DAILY BY MOUTH. 180 tablet 1  . FLUoxetine (PROZAC) 20 MG tablet Take 20 mg by mouth daily.    Marland Kitchen gabapentin (NEURONTIN) 300 MG capsule Take 300 mg by mouth at bedtime.    Marland Kitchen morphine (MSIR) 15 MG tablet Take 15 mg by mouth 3 (three) times daily as needed for severe pain.    . typhoid (VIVOTIF) DR capsule Take 1 capsule by mouth every other day. 4 capsule 0   No facility-administered medications prior to visit.     Allergies  Allergen Reactions  . Bupropion     Other reaction(s): Other Suicidal thoughts  . Sulfa Antibiotics Itching and Hives  . Belsomra [Suvorexant] Other (See Comments)    Nightmares  . Ciprofloxacin Other (See Comments)    GI upset    ROS     Objective:    Physical Exam  Constitutional: She is  oriented to person, place, and time. She appears well-developed and well-nourished.  HENT:  Head: Normocephalic and atraumatic.  Eyes: Conjunctivae and EOM are normal.  Cardiovascular: Normal rate.  Pulmonary/Chest: Effort normal.  Genitourinary: There is no rash or tenderness on the right labia. There is no rash or tenderness on the left labia.    Vaginal erythema and tenderness present.     No vaginal discharge.  There is erythema and tenderness in the vagina.    There is a foreign body in the vagina.     Genitourinary Comments: Vaginal wall with atrophy and some pinpoint bleeding as well as some erythema.  No abnormal discharge from the cervix.  Cervix was stenotic on exam.  But Pap smear was performed.   Lymphadenopathy:       Right: No inguinal adenopathy present.       Left: No inguinal adenopathy present.  Neurological: She is alert and oriented to person, place, and time.  Skin: Skin is dry. No pallor.  Psychiatric: She has a normal mood and affect. Her behavior is normal.  Vitals reviewed.   BP (!) 111/59   Pulse 71   Ht  (1.727 m)   Wt 135 lb (61.2 kg)   SpO2 99%   BMI 20.53 kg/m  Wt Readings from Last 3 Encounters:  03/14/19 135 lb (61.2 kg)  12/30/18 131 lb (59.4 kg)  12/24/18 135 lb (61.2 kg)    Health Maintenance Due  Topic Date Due  . Hepatitis C Screening  September 24, 1956  . HIV Screening  09/07/1970  . PAP SMEAR-Modifier  01/05/2019    There are no preventive care reminders to display for this patient.   Lab Results  Component Value Date   TSH 0.62 11/26/2017   Lab Results  Component Value Date   WBC 8.9 12/30/2018   HGB 14.0 12/30/2018   HCT 41.3 12/30/2018   MCV 89.6 12/30/2018   PLT 405 (H) 12/30/2018   Lab Results  Component Value Date   NA 138 12/30/2018   K 5.1 12/30/2018   CO2 24 12/30/2018   GLUCOSE 105 (H) 12/30/2018   BUN 25 12/30/2018   CREATININE 0.90 12/30/2018   BILITOT 0.6 12/30/2018   ALKPHOS 52 05/11/2016   AST 12  12/30/2018   ALT 9 12/30/2018   PROT 6.8 12/30/2018   ALBUMIN 4.5 05/11/2016   CALCIUM 10.2 12/30/2018   ANIONGAP 5.0 09/03/2006   Lab Results  Component Value Date   CHOL 295 (H) 05/11/2016   Lab Results  Component Value Date   HDL 71 05/11/2016   Lab Results  Component Value Date   LDLCALC 186 (H) 05/11/2016   Lab Results  Component Value Date   TRIG 188 (H) 05/11/2016   Lab Results  Component Value Date   CHOLHDL 4.2 05/11/2016   No results found for: HGBA1C     Assessment & Plan:   Problem List Items Addressed This Visit    None    Visit Diagnoses    Screening for cervical cancer    -  Primary   Relevant Orders   Cytology - PAP   Vaginal atrophy       Relevant Medications   conjugated estrogens (PREMARIN) vaginal cream   Foreign body in vagina, initial encounter         Cervical cancer screening-Pap smear performed.  Will call with results when available.  Foreign body in vagina-removed with forceps upon vaginal exam with speculum.  Patient tolerated procedure well.  Did not see any remnants.  Sent over prescription for metronidazole to fill in case she starts noticing any vaginal discharge or odor but otherwise hold off on filling the prescription.  Vaginal atrophy-discussed maybe a trial of Premarin cream.  Will use nightly for 1 week and then twice a week for maintenance.  She can call us if she has any problems or concerns or if it is too expensive at the pharmacy.   Meds ordered this encounter  Medications  . metroNIDAZOLE (FLAGYL) 500 MG tablet    Sig: Take 1 tablet (500 mg total) by mouth 2 (two) times daily.    Dispense:  14 tablet    Refill:  0  . conjugated estrogens (PREMARIN) vaginal cream    Sig: Place 1 Applicatorful vaginally at bedtime. X 1 week and then twice a week thereaftere.    Dispense:  42.5 g    Refill:  12     Nani Gasser, MD

## 2019-03-15 ENCOUNTER — Ambulatory Visit: Payer: No Typology Code available for payment source | Admitting: Obstetrics and Gynecology

## 2019-03-17 LAB — CYTOLOGY - PAP
DIAGNOSIS: NEGATIVE
HPV: NOT DETECTED

## 2019-03-17 NOTE — Progress Notes (Signed)
Call patient: Your Pap smear is normal. Repeat in 5 years.

## 2019-03-20 ENCOUNTER — Telehealth: Payer: Self-pay | Admitting: Family Medicine

## 2019-03-20 DIAGNOSIS — M545 Low back pain, unspecified: Secondary | ICD-10-CM

## 2019-03-20 DIAGNOSIS — G8929 Other chronic pain: Secondary | ICD-10-CM

## 2019-03-20 DIAGNOSIS — M47816 Spondylosis without myelopathy or radiculopathy, lumbar region: Secondary | ICD-10-CM

## 2019-03-20 NOTE — Telephone Encounter (Signed)
Called [facility] informing pt that she will need to come by to sign a ROI for their clinic to get her records.Marland KitchenMarland KitchenHeath Gold, CMA

## 2019-03-20 NOTE — Telephone Encounter (Signed)
Ok for pain mgt referral.

## 2019-03-20 NOTE — Telephone Encounter (Signed)
Needs referral to pain clinic in [facility]. Refer to [facility]. She called them and they told her she needed a referral and her records. Phone number [phone]. Fax-[phone]

## 2019-03-20 NOTE — Telephone Encounter (Signed)
Referral for pain clinic done.Heath Gold, CMA

## 2019-03-27 ENCOUNTER — Other Ambulatory Visit (HOSPITAL_COMMUNITY): Payer: Self-pay | Admitting: Psychiatry
